# Patient Record
Sex: Female | Born: 1945 | Race: White | Hispanic: No
Health system: Southern US, Community
[De-identification: ages and names within clinical notes are randomized; demographics above are authoritative.]

## PROBLEM LIST (undated history)

## (undated) DIAGNOSIS — F319 Bipolar disorder, unspecified: Secondary | ICD-10-CM

## (undated) DIAGNOSIS — Z8489 Family history of other specified conditions: Secondary | ICD-10-CM

## (undated) DIAGNOSIS — L120 Bullous pemphigoid: Secondary | ICD-10-CM

## (undated) DIAGNOSIS — R42 Dizziness and giddiness: Secondary | ICD-10-CM

## (undated) DIAGNOSIS — G8929 Other chronic pain: Secondary | ICD-10-CM

## (undated) DIAGNOSIS — I639 Cerebral infarction, unspecified: Secondary | ICD-10-CM

## (undated) DIAGNOSIS — M109 Gout, unspecified: Secondary | ICD-10-CM

## (undated) DIAGNOSIS — I10 Essential (primary) hypertension: Secondary | ICD-10-CM

## (undated) DIAGNOSIS — M25519 Pain in unspecified shoulder: Secondary | ICD-10-CM

## (undated) DIAGNOSIS — F329 Major depressive disorder, single episode, unspecified: Secondary | ICD-10-CM

## (undated) DIAGNOSIS — K219 Gastro-esophageal reflux disease without esophagitis: Secondary | ICD-10-CM

## (undated) DIAGNOSIS — E119 Type 2 diabetes mellitus without complications: Secondary | ICD-10-CM

## (undated) DIAGNOSIS — F419 Anxiety disorder, unspecified: Secondary | ICD-10-CM

## (undated) DIAGNOSIS — N390 Urinary tract infection, site not specified: Secondary | ICD-10-CM

## (undated) DIAGNOSIS — F32A Depression, unspecified: Secondary | ICD-10-CM

## (undated) DIAGNOSIS — J189 Pneumonia, unspecified organism: Secondary | ICD-10-CM

## (undated) DIAGNOSIS — G4730 Sleep apnea, unspecified: Secondary | ICD-10-CM

## (undated) DIAGNOSIS — E7800 Pure hypercholesterolemia, unspecified: Secondary | ICD-10-CM

## (undated) DIAGNOSIS — Z9889 Other specified postprocedural states: Secondary | ICD-10-CM

## (undated) DIAGNOSIS — R112 Nausea with vomiting, unspecified: Secondary | ICD-10-CM

## (undated) HISTORY — PX: POSTERIOR FUSION CERVICAL SPINE: SUR628

## (undated) HISTORY — PX: CATARACT EXTRACTION W/ INTRAOCULAR LENS  IMPLANT, BILATERAL: SHX1307

## (undated) HISTORY — PX: WRIST SURGERY: SHX841

---

## 1971-01-22 HISTORY — PX: TUBAL LIGATION: SHX77

## 1982-01-21 HISTORY — PX: APPENDECTOMY: SHX54

## 1982-01-21 HISTORY — PX: CHOLECYSTECTOMY: SHX55

## 1987-01-22 HISTORY — PX: ANTERIOR CERVICAL DECOMP/DISCECTOMY FUSION: SHX1161

## 1988-09-21 DIAGNOSIS — J189 Pneumonia, unspecified organism: Secondary | ICD-10-CM

## 1988-09-21 HISTORY — DX: Pneumonia, unspecified organism: J18.9

## 1997-05-31 ENCOUNTER — Inpatient Hospital Stay (HOSPITAL_COMMUNITY): Admission: RE | Admit: 1997-05-31 | Discharge: 1997-06-02 | Payer: Self-pay | Admitting: Neurosurgery

## 1997-06-29 ENCOUNTER — Encounter: Admission: RE | Admit: 1997-06-29 | Discharge: 1997-09-27 | Payer: Self-pay | Admitting: Neurosurgery

## 1997-09-08 ENCOUNTER — Encounter: Admission: RE | Admit: 1997-09-08 | Discharge: 1997-12-07 | Payer: Self-pay | Admitting: Neurosurgery

## 2001-06-01 ENCOUNTER — Ambulatory Visit (HOSPITAL_COMMUNITY): Admission: RE | Admit: 2001-06-01 | Discharge: 2001-06-01 | Payer: Self-pay | Admitting: Pain Medicine

## 2001-06-07 ENCOUNTER — Ambulatory Visit (HOSPITAL_COMMUNITY): Admission: RE | Admit: 2001-06-07 | Discharge: 2001-06-07 | Payer: Self-pay | Admitting: Pain Medicine

## 2001-06-07 ENCOUNTER — Encounter: Payer: Self-pay | Admitting: Pain Medicine

## 2008-08-31 ENCOUNTER — Inpatient Hospital Stay (HOSPITAL_COMMUNITY): Admission: EM | Admit: 2008-08-31 | Discharge: 2008-09-05 | Payer: Self-pay | Admitting: Emergency Medicine

## 2008-09-01 ENCOUNTER — Encounter (INDEPENDENT_AMBULATORY_CARE_PROVIDER_SITE_OTHER): Payer: Self-pay | Admitting: Internal Medicine

## 2009-09-14 ENCOUNTER — Ambulatory Visit (HOSPITAL_COMMUNITY): Admission: RE | Admit: 2009-09-14 | Discharge: 2009-09-14 | Payer: Self-pay | Admitting: Ophthalmology

## 2009-10-05 ENCOUNTER — Ambulatory Visit (HOSPITAL_COMMUNITY): Admission: RE | Admit: 2009-10-05 | Discharge: 2009-10-05 | Payer: Self-pay | Admitting: Ophthalmology

## 2009-10-10 ENCOUNTER — Ambulatory Visit: Admission: RE | Admit: 2009-10-10 | Discharge: 2009-10-10 | Payer: Self-pay | Admitting: Neurology

## 2010-04-05 LAB — GLUCOSE, CAPILLARY: Glucose-Capillary: 146 mg/dL — ABNORMAL HIGH (ref 70–99)

## 2010-04-06 LAB — HEMOGLOBIN AND HEMATOCRIT, BLOOD: Hemoglobin: 11.6 g/dL — ABNORMAL LOW (ref 12.0–15.0)

## 2010-04-06 LAB — BASIC METABOLIC PANEL
BUN: 10 mg/dL (ref 6–23)
CO2: 27 mEq/L (ref 19–32)
Chloride: 102 mEq/L (ref 96–112)
Glucose, Bld: 126 mg/dL — ABNORMAL HIGH (ref 70–99)
Potassium: 4.4 mEq/L (ref 3.5–5.1)

## 2010-04-28 LAB — GLUCOSE, CAPILLARY
Glucose-Capillary: 230 mg/dL — ABNORMAL HIGH (ref 70–99)
Glucose-Capillary: 98 mg/dL (ref 70–99)

## 2010-04-29 LAB — HEPATIC FUNCTION PANEL
AST: 44 U/L — ABNORMAL HIGH (ref 0–37)
Albumin: 2.9 g/dL — ABNORMAL LOW (ref 3.5–5.2)
Alkaline Phosphatase: 54 U/L (ref 39–117)
Total Bilirubin: 0.6 mg/dL (ref 0.3–1.2)
Total Protein: 6.1 g/dL (ref 6.0–8.3)

## 2010-04-29 LAB — GLUCOSE, CAPILLARY
Glucose-Capillary: 136 mg/dL — ABNORMAL HIGH (ref 70–99)
Glucose-Capillary: 155 mg/dL — ABNORMAL HIGH (ref 70–99)
Glucose-Capillary: 171 mg/dL — ABNORMAL HIGH (ref 70–99)
Glucose-Capillary: 192 mg/dL — ABNORMAL HIGH (ref 70–99)
Glucose-Capillary: 204 mg/dL — ABNORMAL HIGH (ref 70–99)
Glucose-Capillary: 215 mg/dL — ABNORMAL HIGH (ref 70–99)
Glucose-Capillary: 300 mg/dL — ABNORMAL HIGH (ref 70–99)
Glucose-Capillary: 307 mg/dL — ABNORMAL HIGH (ref 70–99)
Glucose-Capillary: 381 mg/dL — ABNORMAL HIGH (ref 70–99)
Glucose-Capillary: 390 mg/dL — ABNORMAL HIGH (ref 70–99)
Glucose-Capillary: 83 mg/dL (ref 70–99)

## 2010-04-29 LAB — PROTIME-INR: INR: 0.9 (ref 0.00–1.49)

## 2010-04-29 LAB — LIPID PANEL
Cholesterol: 206 mg/dL — ABNORMAL HIGH (ref 0–200)
HDL: 40 mg/dL (ref >39)
LDL Cholesterol: 119 mg/dL — ABNORMAL HIGH (ref 0–99)
Triglycerides: 233 mg/dL — ABNORMAL HIGH (ref <150)

## 2010-04-29 LAB — URINALYSIS, ROUTINE W REFLEX MICROSCOPIC
Bilirubin Urine: NEGATIVE
Hgb urine dipstick: NEGATIVE
Protein, ur: NEGATIVE mg/dL
Urobilinogen, UA: 0.2 mg/dL (ref 0.0–1.0)

## 2010-04-29 LAB — URINE CULTURE: Colony Count: >=100000

## 2010-04-29 LAB — CARDIAC PANEL(CRET KIN+CKTOT+MB+TROPI)
CK, MB: 0.9 ng/mL (ref 0.3–4.0)
CK, MB: 0.9 ng/mL (ref 0.3–4.0)
Relative Index: INVALID (ref 0.0–2.5)
Relative Index: INVALID (ref 0.0–2.5)
Total CK: 43 U/L (ref 7–177)
Total CK: 43 U/L (ref 7–177)
Troponin I: 0.01 ng/mL (ref 0.00–0.06)

## 2010-04-29 LAB — COMPREHENSIVE METABOLIC PANEL
AST: 41 U/L — ABNORMAL HIGH (ref 0–37)
Albumin: 3 g/dL — ABNORMAL LOW (ref 3.5–5.2)
Albumin: 3.3 g/dL — ABNORMAL LOW (ref 3.5–5.2)
Alkaline Phosphatase: 57 U/L (ref 39–117)
BUN: 21 mg/dL (ref 6–23)
Chloride: 99 mEq/L (ref 96–112)
Chloride: 99 mEq/L (ref 96–112)
Creatinine, Ser: 1.01 mg/dL (ref 0.4–1.2)
Creatinine, Ser: 1.04 mg/dL (ref 0.4–1.2)
GFR calc Af Amer: >60 mL/min (ref >60)
Glucose, Bld: 227 mg/dL — ABNORMAL HIGH (ref 70–99)
Potassium: 3.3 mEq/L — ABNORMAL LOW (ref 3.5–5.1)
Potassium: 4 mEq/L (ref 3.5–5.1)
Total Bilirubin: 0.5 mg/dL (ref 0.3–1.2)
Total Bilirubin: 0.5 mg/dL (ref 0.3–1.2)

## 2010-04-29 LAB — BASIC METABOLIC PANEL
BUN: 18 mg/dL (ref 6–23)
CO2: 27 mEq/L (ref 19–32)
Calcium: 8.6 mg/dL (ref 8.4–10.5)
Calcium: 8.6 mg/dL (ref 8.4–10.5)
Calcium: 9 mg/dL (ref 8.4–10.5)
Chloride: 99 mEq/L (ref 96–112)
Creatinine, Ser: 0.84 mg/dL (ref 0.4–1.2)
Creatinine, Ser: 0.92 mg/dL (ref 0.4–1.2)
Creatinine, Ser: 1.33 mg/dL — ABNORMAL HIGH (ref 0.4–1.2)
GFR calc Af Amer: 49 mL/min — ABNORMAL LOW (ref >60)
GFR calc non Af Amer: >60 mL/min (ref >60)
Glucose, Bld: 158 mg/dL — ABNORMAL HIGH (ref 70–99)
Glucose, Bld: 194 mg/dL — ABNORMAL HIGH (ref 70–99)

## 2010-04-29 LAB — CBC
MCV: 88 fL (ref 78.0–100.0)
Platelets: 206 10*3/uL (ref 150–400)
RBC: 4.23 MIL/uL (ref 3.87–5.11)
WBC: 7.8 10*3/uL (ref 4.0–10.5)
WBC: 8 10*3/uL (ref 4.0–10.5)

## 2010-04-29 LAB — DIFFERENTIAL
Lymphs Abs: 3 10*3/uL (ref 0.7–4.0)
Monocytes Relative: 8 % (ref 3–12)
Neutro Abs: 3.9 10*3/uL (ref 1.7–7.7)
Neutrophils Relative %: 50 % (ref 43–77)

## 2010-04-29 LAB — URINE MICROSCOPIC-ADD ON

## 2010-04-29 LAB — APTT: aPTT: 25 seconds (ref 24–37)

## 2010-04-29 LAB — TSH: TSH: 2.584 u[IU]/mL (ref 0.350–4.500)

## 2010-04-29 LAB — HEMOGLOBIN A1C: Mean Plasma Glucose: 272 mg/dL

## 2010-06-05 NOTE — H&P (Signed)
NAME:  Sylvia Black, Sylvia Black                ACCOUNT NO.:  [id number]   MEDICAL RECORD NO.:  [id number]          PATIENT TYPE:  OBV   LOCATION:  4735                         FACILITY:  [facility]   PHYSICIAN:  Renee Ramus, MD       DATE OF BIRTH:  08/14/1945   DATE OF ADMISSION:  08/30/2008  DATE OF DISCHARGE:                              HISTORY & PHYSICAL   The patient does not have a primary care physician in this area.  Please  list the patient as unassigned.   HISTORY OF PRESENT ILLNESS:  The patient is a 65 year old female with  complaints of weakness x24 hours prior to admission.  The patient  reports no previous history of these symptoms.  She reports listing to  one side.  She denies any focal neurological deficits.  She does say  that the sensation is worse with ambulation, decreases with rest.  The  patient says that it effects her gait, and has caused her to almost  bump into furniture.  The patient denies any previous ear infections or  any recent changes to medications.   PAST MEDICAL HISTORY:  1. Bipolar disease.  2. Hypertension.  3. Diabetes mellitus type 2, uncontrolled.  4. Morbid obesity.  5. Obstructive sleep apnea.   SOCIAL HISTORY:  The patient denies alcohol or tobacco use.   FAMILY HISTORY:  Not available.   REVIEW OF SYSTEMS:  All other comprehensive review of systems are  negative.   ALLERGIES:  The patient is allergic to SULFA.   CURRENT MEDICATIONS:  1. Lasix 40 mg p.o. daily.  2. Hydroxyzine 25 mg p.o. daily as needed.  3. Accuretic, which is quinapril/hydrochlorothiazide 20/25 one p.o.      daily.  4. Toprol-XL 50 mg p.o. daily.  5. Tizanidine 4 mg p.o. daily.  6. Avandia 4 mg p.o. daily.  7. Klor-Con 10 mEq p.o. daily.  8. Clonazepam 1 mg p.o. t.i.d.  9. Zolpidem tartrate 10 mg p.o. at bedtime p.r.n. insomnia.   PHYSICAL EXAMINATION:  GENERAL:  This is a morbidly obese white female  currently in no apparent distress.  VITAL SIGNS:  Blood pressure  143/49, heart rate 72, respiratory rate 22,  temperature 97.4.  NECK:  No jugular venous distention or  lymphadenopathy.  HEENT:  Oropharynx is clear.  Mucous membranes pink and moist.  TMs  clear bilaterally.  Pupils equal, reactive to light and accommodation.  Extraocular muscles are intact.  CARDIOVASCULAR:  Regular rate and  rhythm without murmurs, rubs, or gallops.  PULMONARY:  Lungs are clear  to auscultation bilaterally.  ABDOMEN:  Soft, obese, nontender, nondistended without  hepatosplenomegaly.  Bowel sounds are present.  She has no rebound or  guarding.  EXTREMITIES:  She has no clubbing, cyanosis, or edema.  She has good  peripheral pulses in dorsalis pedis and radial arteries.  She is able to  move all extremities.  NEUROLOGIC:  Cranial nerves II through XII are grossly intact.  She has  no focal neurological deficits with the exception of nystagmus prominent  to the right.   STUDIES:  Head CT  shows small vessel disease, but no evidence of stroke.   LABORATORY DATA:  White count 7.8, H&H 12.7 and 37, MCV 80, platelets  232.  Sodium 132, potassium 3.9, chloride 93, bicarb 29, BUN 20,  creatinine 1.3, glucose 247.  UA shows signs of infection.   ASSESSMENT AND PLAN:  1. Vertigo.  Likely this is benign paroxysmal positional vertigo.  We      will attempt to treat with meclizine and reassess in the a.m.  We      will also get physical therapy evaluation for possible vestibular      training. The possiblity exists that this is central vertigo, so      will also work up for potential stroke.  2. Diabetes mellitus type 2, uncontrolled.  We will check hemoglobin      A1c and continue Avandia.  We will also give a small amount of      Lantus and use sliding scale insulin.  3. Hypertension.  We will start the patient on captopril and hold her      diuretic.  4. Obesity.  We will check thyroid.  5. Urinary tract infection.  We will start the patient on      ciprofloxacin.  6.  Bipolar disease.  We will continue benzyl therapy.  7. Dehydration.  We will discontinue hydrochlorothiazide and give the      patient conservative IV fluids.  8. Obstructive sleep apnea.  We will continue continuous positive      airway pressure with settings 12/10, and RT will be able to adjust      this for comfort.   H&P was constructed by reviewing past medical history, conferring with  emergency medical room physician, and reviewing the emergency medical  record.   TIME SPENT:  One hour.      Renee Ramus, MD  Electronically Signed     JF/MEDQ  D:  08/30/2008  T:  08/31/2008  Job:  [id number]

## 2010-06-05 NOTE — Group Therapy Note (Signed)
NAMEMarland Kitchen  Sylvia Black, Sylvia Black                ACCOUNT NO.:  [id number]   MEDICAL RECORD NO.:  [id number]          PATIENT TYPE:  INP   LOCATION:  4735                         FACILITY:  [facility]   PHYSICIAN:  Herbie Saxon, MDDATE OF BIRTH:  [birth year]                                 PROGRESS NOTE   DISCHARGE DIAGNOSES:  1. Acute right cerebrovascular accident.  2. Left middle cerebral artery stenosis.  3. Hyponatremia, improved.  4. Urinary tract infection.  5. Morbid obesity.  6. Obstructive sleep apnea, on CPAP.  7. Hypokalemia, repleted.  8. Elevated AST.  9. Diabetes, type 2.  10.Hyperlipidemia.  11.Acute renal insufficiency, resolved.  12.Hypertension, stable.  13.Nonsustained V-tach.  14.History of bipolar disease.   RADIOLOGY:  1. The CT head of August 30, 2008, shows no evidence of acute      intracranial abnormality.  2. 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  3. MRI brain of August 31, 2008, revealed acute to subacute ischemia      involving the right corona radiata adjacent to the body of the      right ventricle, remote lacunar infarct, nonspecific subcortical      white matter changes supratentorially, mild inflammatory mucosal      thickening of the maxillary sinuses and the ethmoid air cells,      probable remote ischemia related to blood breakdown products      bifrontally.  4. MRA head and neck of August 31, 2008, shows probable significant      stenosis of the left middle cerebral artery in the distal M1      segment, probable moderate atherosclerotic changes involving the      left posterior cerebral artery.   HOSPITAL COURSE:  This 65 year old Caucasian lady who does not have a primary care  physician and who has financial  difficulties with the medications  presented to the emergency room with difficulty walking with severe  unsteadiness.  At presentation, diabetes mellitus was noted to be  uncontrolled.  Blood pressure was also noticed to be marginally  controlled.  Urine test did show signs of urinary tract infection.  Urine culture was sent and patient was started on ciprofloxacin.  Her  hydrochlorothiazide was discontinued and patient was started on  __________ IV fluids.  CPAP was continued at the home setting of 1210  with respiratory therapy adjusted.  With the IV fluid hydration,  hyponatremia had resolved as of August 31, 2008.  Glucose control was  also improved.  Hypokalemia was repleted.  Lipid panel showed a total  cholesterol of 203, triglycerides 233, LDL fraction of 119.  Patient was  started on Zocor.  Lantus insulin dosage has been escalated on a daily  basis to optimize her glycemic control.  Patient had been started on  captopril.  Blood pressures remain stable.  Urine culture as of September 01, 2008, showed more than 100,000 colonies, identity not yet specified,  microbiology  not yet specified.  Patient has been ambulating with PT  support and she is being set up by social workers for home health PT/OT  followup.  Patient will also need social worker assistance with  obtaining the medications.  Echocardiogram on this admission performed  on September 01, 2008, showed that ejection fraction was 60% to 65%.  No  cardiac source of emboli seen.  She has mild left ventricular atrophy.   DISPOSITION:  Will be home with home health and also vestibular physical therapy as  outpatient as needed.   MEDICATIONS:  Will be dictated at final discharge.   EXAMINATION:  Today, she is an elderly lady not in acute distress.  VITAL SIGNS:  Temperature 97.  Pulse 63.  Respiratory rate is 20.  Blood  pressure 133/64.  HEAD:  Atraumatic, normocephalic.  She is morbidly obese.  Pupils equal  and reactive to light and accommodation.  Mucous membranes are moist.  Oropharynx and nasopharynx are clear.  Tympanic  membranes are intact.  NECK:  Supple.  No elevated JVD.  No thyromegaly.  No carotid bruits.  No lymphadenopathy.  CHEST:  Clinically clear.  HEART:  Sounds 1 and 2 regular rate and rhythm.  No murmur, rubs,  gallops, or clicks.  ABDOMEN:  Benign.  She has truncal obesity.  No organomegaly.  No  ventral or inguinal hernia.  She has mild left hemiparesis in the left  upper extremities, 4+, and left lower extremity 4.  Has a hemiplegic  gait.  Peripheral pulses present.  No pedal edema.   CURRENT LABS:  Sodium is 135, potassium 4.0, chloride 99, bicarbonate 28, glucose 194,  BUN 18, creatinine 0.9.  hemoglobin A1c 11.1.  Thyroid function test  normal.  WBC 8, hematocrit 36, platelet count 206.      Herbie Saxon, MD  Electronically Signed     MIO/MEDQ  D:  09/04/2008  T:  09/04/2008  Job:  [id number]

## 2010-06-05 NOTE — Discharge Summary (Signed)
NAMEMarland Black  [name], [name]                ACCOUNT NO.:  [id number]   MEDICAL RECORD NO.:  [id number]          PATIENT TYPE:  INP   LOCATION:  4735                         FACILITY:  [facility]   PHYSICIAN:  Elliot Cousin, M.D.    DATE OF BIRTH:  22-Apr-1945   DATE OF ADMISSION:  08/30/2008  DATE OF DISCHARGE:  09/05/2008                               DISCHARGE SUMMARY   Please see the previous progress note summary dictated by Dr. Christella Noa  on September 04, 2008.   DISCHARGE DIAGNOSES AND HOSPITAL COURSE:  1. ACUTE RIGHT BRAIN STROKE, REMOTE LACUNAR STROKES, AND PROBABLE      SIGNIFICANT STENOSIS OF THE LEFT MIDDLE CEREBRAL ARTERY IN THE      DISTAL M1 SEGMENT.  The patient was started on aspirin therapy.      The results of the MRI of the brain and MRA of the brain were noted      for a probable significant stenosis of the left middle cerebral      artery, moderate atherosclerotic disease, acute and subacute      ischemia involving the right corona radiata and remote lacunar      infarcts.  The results of her 2-D echocardiogram revealed an      ejection fraction of 60% to 65% with no regional wall motion      abnormalities and no obvious cardiac source of emboli identified.      The patient does have mild left-sided hemiparesis and      disequilibrium as a consequence of the acute stroke.  She was      evaluated by both the physical and occupational therapists.  They      both recommended vestibular training and ongoing therapy at home.      They also recommended a rolling walker.  These services were      ordered prior to the patient's discharge.  In addition to aspirin      therapy, she was started on scheduled meclizine.  However, upon      discharge, the meclizine was changed to as needed for treatment of      dizziness or vertiginous symptoms.  2. DYSLIPIDEMIA.  The patient's fasting lipid profile revealed a total      cholesterol of 206, triglycerides of 233, HDL cholesterol of 40,      and  LDL cholesterol of 119.  She was started on Zocor 40 mg      nightly.  Because of financial constraints, the Zocor was      discontinued upon discharge and she was prescribed pravastatin,      which she will be able to purchase at the [facility] pharmacy for $4      per prescription.  3. TYPE 2 INSULIN-REQUIRING DIABETES MELLITUS.  The patient was      started on sliding scale NovoLog and Lantus.  However at home, she      was treated with sliding scale regular insulin and NPH.  Her      glycemic control was somewhat labile.  Her hemoglobin A1c was quite  elevated at 11.1.  Because of financial constraints, the patient      was discharged to home on her usual dose of NPH insulin and sliding      scale regular insulin.  Further management will be deferred to her      primary care physician at the [facility] Department.  4. HYPERTENSION.  The patient's blood pressure was more or less      controlled during hospitalization.  She was maintained on her usual      antihypertensive medications.  5. URINARY TRACT INFECTION.  The patient's urinalysis was positive for      WBCs.  She was started on Cipro and Diflucan empirically.  Her      urine culture grew out greater than 100,000 colonies of multiple      bacteria, none predominant.  She completed 7 days of Cipro therapy      and several days of Diflucan therapy.  Prior to hospital discharge,      she had no complaints of painful urination.  She was afebrile.  Her      white blood cell count was within normal limits.  6. HYPOKALEMIA.  The patient's serum potassium fell to a nadir of 3.3.      She was repleted with potassium chloride.  Prior to hospital      discharge, her serum potassium improved to 4.0.  7. MORBID OBESITY AND OBSTRUCTIVE SLEEP APNEA.  The patient was      continued on CPAP therapy.  Weight loss and adherence to a diabetic      diet were encouraged during the hospitalization.  8. ACUTE RENAL INSUFFICIENCY.  The  patient's BUN and creatinine      normalized to 18 and 0.92 respectively.  9. NONSUSTAINED VENTRICULAR TACHYCARDIA.  The patient experienced a      brief run of V-TACH.  Cardiac enzymes were ordered and they were      completely negative.   DISCHARGE MEDICATIONS:  1. Lasix 40 mg daily.  2. Hydroxyzine 25 mg daily as needed.  3. Accuretic (quinapril and hydrochlorothiazide) 20 mg/25 mg daily.  4. Toprol-XL 50 mg daily.  5. Tizanidine 4 mg daily.  6. Stop Avandia.  7. Klor-Con 10 mEq daily.  8. Clonazepam 1 mg t.i.d.  9. Zolpidem 10 mg at bedtime.  10.Wellbutrin XL 150 mg twice daily.  11.Humulin N 55 units subcu in the morning and subcu in the evening.  12.Sliding scale regular insulin before each meal as prescribed      previously.  13.Pravastatin 20 mg nightly.  14.Meclizine 25 mg every 8 hours as needed for dizziness.  15.Aspirin 325 mg daily.  16.CPAP nightly.   DISCHARGE DISPOSITION:  The patient is being discharged to home in  improved and stable condition.  Because of financial constraints, most  if not all of her previous home medications were restarted prior to  hospital discharge.  The patient is able to get her medications from the  Health Department in [facility].  Otherwise, the patient was  advised to purchase pravastatin and meclizine at [facility] pharmacy for  $4 per prescription.  Home Health Services have been arranged including  a Child psychotherapist, physical therapist, occupational therapist, and  appropriate equipment.  She was advised to follow up with her primary  care physician at the [facility] Department in 1 week.  The patient voiced understanding.      Elliot Cousin, M.D.  Electronically Signed  DF/MEDQ  D:  09/05/2008  T:  09/06/2008  Job:  [id number]   cc:   [facility]

## 2013-02-21 ENCOUNTER — Inpatient Hospital Stay (HOSPITAL_COMMUNITY)
Admission: EM | Admit: 2013-02-21 | Discharge: 2013-02-23 | DRG: 638 | Disposition: A | Discharge status: Home / Self Care | Payer: Medicare HMO | Attending: Family Medicine | Admitting: Family Medicine

## 2013-02-21 ENCOUNTER — Encounter (HOSPITAL_COMMUNITY): Payer: Self-pay | Admitting: Emergency Medicine

## 2013-02-21 DIAGNOSIS — Z887 Allergy status to serum and vaccine status: Secondary | ICD-10-CM

## 2013-02-21 DIAGNOSIS — E785 Hyperlipidemia, unspecified: Secondary | ICD-10-CM | POA: Diagnosis present

## 2013-02-21 DIAGNOSIS — Z7902 Long term (current) use of antithrombotics/antiplatelets: Secondary | ICD-10-CM

## 2013-02-21 DIAGNOSIS — Z7982 Long term (current) use of aspirin: Secondary | ICD-10-CM

## 2013-02-21 DIAGNOSIS — Z880 Allergy status to penicillin: Secondary | ICD-10-CM

## 2013-02-21 DIAGNOSIS — I639 Cerebral infarction, unspecified: Secondary | ICD-10-CM

## 2013-02-21 DIAGNOSIS — T383X5A Adverse effect of insulin and oral hypoglycemic [antidiabetic] drugs, initial encounter: Secondary | ICD-10-CM | POA: Diagnosis present

## 2013-02-21 DIAGNOSIS — E119 Type 2 diabetes mellitus without complications: Secondary | ICD-10-CM

## 2013-02-21 DIAGNOSIS — Z882 Allergy status to sulfonamides status: Secondary | ICD-10-CM

## 2013-02-21 DIAGNOSIS — Z9851 Tubal ligation status: Secondary | ICD-10-CM

## 2013-02-21 DIAGNOSIS — N390 Urinary tract infection, site not specified: Secondary | ICD-10-CM | POA: Diagnosis present

## 2013-02-21 DIAGNOSIS — Y92009 Unspecified place in unspecified non-institutional (private) residence as the place of occurrence of the external cause: Secondary | ICD-10-CM

## 2013-02-21 DIAGNOSIS — Z8673 Personal history of transient ischemic attack (TIA), and cerebral infarction without residual deficits: Secondary | ICD-10-CM

## 2013-02-21 DIAGNOSIS — F411 Generalized anxiety disorder: Secondary | ICD-10-CM | POA: Diagnosis present

## 2013-02-21 DIAGNOSIS — R680 Hypothermia, not associated with low environmental temperature: Secondary | ICD-10-CM | POA: Diagnosis present

## 2013-02-21 DIAGNOSIS — I10 Essential (primary) hypertension: Secondary | ICD-10-CM

## 2013-02-21 DIAGNOSIS — E1169 Type 2 diabetes mellitus with other specified complication: Principal | ICD-10-CM | POA: Diagnosis present

## 2013-02-21 DIAGNOSIS — E162 Hypoglycemia, unspecified: Secondary | ICD-10-CM | POA: Diagnosis present

## 2013-02-21 DIAGNOSIS — T68XXXA Hypothermia, initial encounter: Secondary | ICD-10-CM

## 2013-02-21 DIAGNOSIS — Z79899 Other long term (current) drug therapy: Secondary | ICD-10-CM

## 2013-02-21 DIAGNOSIS — Z794 Long term (current) use of insulin: Secondary | ICD-10-CM

## 2013-02-21 HISTORY — DX: Gastro-esophageal reflux disease without esophagitis: K21.9

## 2013-02-21 HISTORY — DX: Nausea with vomiting, unspecified: R11.2

## 2013-02-21 HISTORY — DX: Cerebral infarction, unspecified: I63.9

## 2013-02-21 HISTORY — DX: Major depressive disorder, single episode, unspecified: F32.9

## 2013-02-21 HISTORY — DX: Pain in unspecified shoulder: M25.519

## 2013-02-21 HISTORY — DX: Other chronic pain: G89.29

## 2013-02-21 HISTORY — DX: Type 2 diabetes mellitus without complications: E11.9

## 2013-02-21 HISTORY — DX: Nausea with vomiting, unspecified: Z98.890

## 2013-02-21 HISTORY — DX: Anxiety disorder, unspecified: F41.9

## 2013-02-21 HISTORY — DX: Bipolar disorder, unspecified: F31.9

## 2013-02-21 HISTORY — DX: Family history of other specified conditions: Z84.89

## 2013-02-21 HISTORY — DX: Sleep apnea, unspecified: G47.30

## 2013-02-21 HISTORY — DX: Urinary tract infection, site not specified: N39.0

## 2013-02-21 HISTORY — DX: Depression, unspecified: F32.A

## 2013-02-21 HISTORY — DX: Gout, unspecified: M10.9

## 2013-02-21 HISTORY — DX: Pure hypercholesterolemia, unspecified: E78.00

## 2013-02-21 HISTORY — DX: Essential (primary) hypertension: I10

## 2013-02-21 HISTORY — DX: Dizziness and giddiness: R42

## 2013-02-21 HISTORY — DX: Pneumonia, unspecified organism: J18.9

## 2013-02-21 LAB — URINE MICROSCOPIC-ADD ON

## 2013-02-21 LAB — GLUCOSE, CAPILLARY
GLUCOSE-CAPILLARY: 77 mg/dL (ref 70–99)
Glucose-Capillary: 158 mg/dL — ABNORMAL HIGH (ref 70–99)
Glucose-Capillary: 39 mg/dL — CL (ref 70–99)
Glucose-Capillary: 136 mg/dL — ABNORMAL HIGH (ref 70–99)
Glucose-Capillary: 139 mg/dL — ABNORMAL HIGH (ref 70–99)

## 2013-02-21 LAB — URINALYSIS, ROUTINE W REFLEX MICROSCOPIC
BILIRUBIN URINE: NEGATIVE
GLUCOSE, UA: 250 mg/dL — AB
HGB URINE DIPSTICK: NEGATIVE
Ketones, ur: NEGATIVE mg/dL
Nitrite: NEGATIVE
Protein, ur: NEGATIVE mg/dL
SPECIFIC GRAVITY, URINE: 1.013 (ref 1.005–1.030)
Urobilinogen, UA: 1 mg/dL (ref 0.0–1.0)
pH: 6 (ref 5.0–8.0)

## 2013-02-21 LAB — COMPREHENSIVE METABOLIC PANEL
ALK PHOS: 71 U/L (ref 39–117)
ALT: 25 U/L (ref 0–35)
AST: 29 U/L (ref 0–37)
Albumin: 3 g/dL — ABNORMAL LOW (ref 3.5–5.2)
BUN: 17 mg/dL (ref 6–23)
CO2: 22 mEq/L (ref 19–32)
Calcium: 8.8 mg/dL (ref 8.4–10.5)
Chloride: 99 mEq/L (ref 96–112)
Creatinine, Ser: 0.7 mg/dL (ref 0.50–1.10)
GFR calc non Af Amer: 89 mL/min — ABNORMAL LOW (ref >90)
GLUCOSE: 135 mg/dL — AB (ref 70–99)
Potassium: 3.8 mEq/L (ref 3.7–5.3)
Sodium: 139 mEq/L (ref 137–147)
Total Bilirubin: <0.2 mg/dL — ABNORMAL LOW (ref 0.3–1.2)
Total Protein: 6.7 g/dL (ref 6.0–8.3)

## 2013-02-21 LAB — CBC WITH DIFFERENTIAL/PLATELET
BASOS PCT: 0 % (ref 0–1)
Basophils Absolute: 0 10*3/uL (ref 0.0–0.1)
EOS ABS: 0.1 10*3/uL (ref 0.0–0.7)
Eosinophils Relative: 1 % (ref 0–5)
HEMATOCRIT: 35.6 % — AB (ref 36.0–46.0)
Hemoglobin: 12.1 g/dL (ref 12.0–15.0)
LYMPHS ABS: 2.1 10*3/uL (ref 0.7–4.0)
Lymphocytes Relative: 21 % (ref 12–46)
MCH: 30.2 pg (ref 26.0–34.0)
MCHC: 34 g/dL (ref 30.0–36.0)
MCV: 88.8 fL (ref 78.0–100.0)
MONO ABS: 0.7 10*3/uL (ref 0.1–1.0)
Monocytes Relative: 7 % (ref 3–12)
NEUTROS ABS: 7.2 10*3/uL (ref 1.7–7.7)
NEUTROS PCT: 71 % (ref 43–77)
Platelets: 254 10*3/uL (ref 150–400)
RBC: 4.01 MIL/uL (ref 3.87–5.11)
RDW: 13.6 % (ref 11.5–15.5)
WBC: 10.2 10*3/uL (ref 4.0–10.5)

## 2013-02-21 MED ORDER — DEXTROSE-NACL 5-0.45 % IV SOLN
INTRAVENOUS | Status: DC
  Administered 2013-02-21 – 2013-02-23 (×3): via INTRAVENOUS

## 2013-02-21 MED ORDER — SODIUM CHLORIDE 0.9 % IV SOLN
INTRAVENOUS | Status: DC
  Administered 2013-02-21: 21:00:00 via INTRAVENOUS

## 2013-02-21 MED ORDER — SODIUM CHLORIDE 0.9 % IV BOLUS (SEPSIS)
1000.0000 mL | Freq: Once | INTRAVENOUS | Status: AC
  Administered 2013-02-21: 1000 mL via INTRAVENOUS

## 2013-02-21 MED ORDER — DEXTROSE 50 % IV SOLN
INTRAVENOUS | Status: AC
  Filled 2013-02-21: qty 50

## 2013-02-21 MED ORDER — DEXTROSE 50 % IV SOLN
1.0000 | Freq: Once | INTRAVENOUS | Status: AC
  Administered 2013-02-21: 50 mL via INTRAVENOUS

## 2013-02-21 MED ORDER — DEXTROSE 5 % IV SOLN: Freq: Once | INTRAVENOUS | Status: DC

## 2013-02-21 MED ORDER — ONDANSETRON HCL 4 MG/2ML IJ SOLN
4.0000 mg | Freq: Once | INTRAMUSCULAR | Status: AC
  Administered 2013-02-21: 4 mg via INTRAVENOUS
  Filled 2013-02-21: qty 2

## 2013-02-21 NOTE — ED Provider Notes (Signed)
CSN: [id number]     Arrival date & time 02/21/13  2027 History   First MD Initiated Contact with Patient 02/21/13 2027     Chief Complaint  Patient presents with  . Hypoglycemia   (Consider location/radiation/quality/duration/timing/severity/associated sxs/prior Treatment) Patient is a 68 y.o. female presenting with hypoglycemia.  Hypoglycemia  Pt with history of DM reports she has had multiple episodes of vomiting over the last 24hrs, not associated with fever abdominal pain or diarrhea. Today she began to feel lightheaded, was unresponsive for a time and so family began CPR although it isn't clear if she was ever pulseless. EMS found her to have CBG of 22, given D50 and started on LR infusion with some improvement in her mental status. Still feeling nauseated. [place], does not yet have a PCP there.   Past Medical History  Diagnosis Date  . Diabetes mellitus without complication   . Hypertension   . Kidney infection   . Stroke    Past Surgical History  Procedure Laterality Date  . Tubal ligation    . Neck surgery    . Wrist surgery     No family history on file. History  Substance Use Topics  . Smoking status: Never Smoker   . Smokeless tobacco: Not on file  . Alcohol Use: No   OB History   Grav Para Term Preterm Abortions TAB SAB Ect Mult Living                 Review of Systems All other systems reviewed and are negative except as noted in HPI.   Allergies  Sulfa antibiotics and Tetanus toxoids  Home Medications  No current outpatient prescriptions on file. BP 131/65  Pulse 59  Resp 18  Ht 5' 2.5" (1.588 m)  Wt 203 lb (92.08 kg)  BMI 36.51 kg/m2  SpO2 97% Physical Exam  Nursing note and vitals reviewed. Constitutional: She is oriented to person, place, and time. She appears well-developed and well-nourished.  HENT:  Head: Normocephalic and atraumatic.  Eyes: EOM are normal. Pupils are equal, round, and reactive to  light.  Neck: Normal range of motion. Neck supple.  Cardiovascular: Normal rate, normal heart sounds and intact distal pulses.   Pulmonary/Chest: Effort normal and breath sounds normal.  Abdominal: Bowel sounds are normal. She exhibits no distension. There is no tenderness.  Musculoskeletal: Normal range of motion. She exhibits no edema and no tenderness.  Neurological: She is alert and oriented to person, place, and time. No cranial nerve deficit or sensory deficit.  R side 5/5, L side 4/5 from old stroke unchanged from baseline  Skin: Skin is warm and dry. No rash noted.  Psychiatric: She has a normal mood and affect.    ED Course  Procedures (including critical care time) Labs Review Labs Reviewed  CBC WITH DIFFERENTIAL - Abnormal; Notable for the following:    HCT 35.6 (*)    All other components within normal limits  COMPREHENSIVE METABOLIC PANEL - Abnormal; Notable for the following:    Glucose, Bld 135 (*)    Albumin 3.0 (*)    Total Bilirubin <0.2 (*)    GFR calc non Af Amer 89 (*)    All other components within normal limits  GLUCOSE, CAPILLARY - Abnormal; Notable for the following:    Glucose-Capillary 39 (*)    All other components within normal limits  GLUCOSE, CAPILLARY - Abnormal; Notable for the following:    Glucose-Capillary 158 (*)  All other components within normal limits  GLUCOSE, CAPILLARY - Abnormal; Notable for the following:    Glucose-Capillary 136 (*)    All other components within normal limits  GLUCOSE, CAPILLARY - Abnormal; Notable for the following:    Glucose-Capillary 139 (*)    All other components within normal limits  GLUCOSE, CAPILLARY  URINALYSIS, ROUTINE W REFLEX MICROSCOPIC   Imaging Review No results found.  EKG Interpretation   None       MDM   1. Hypoglycemia   2. Hypothermia     Pt dropped CBG again after arrival, did not have mental status changes. Given another D50 bolus and started on D5 drip. Bair hugger for  hypothermia, pt feeling much better. Essentially asymptomatic at this point. Will admit for further warming.     Charles B. Bernette Mayers, MD 02/21/13 [phone]

## 2013-02-21 NOTE — H&P (Signed)
[facility] History and Physical  Sylvia Black:[id number] DOB: 29-Mar-1945 DOA: 02/21/2013  Referring physician: ED physician PCP: [name], MD   Chief Complaint: Unresponsive  HPI:  Patient is 68 year old female with a history of diabetes mellitus requiring insulin, hypertension, hyperlipidemia, anxiety who was brought by EMS to [facility] emergency department after found unresponsive at home. Upon EMS arrival, CBG found to be in 30 inpatient hypothermic with temperature 92.5. Currently in emergency department patient is Sylvia and awake, feeling well and able to provide history. She explains she has taken her regular insulin dose but did not eat as she was feeling sick, nausea and malaise. Patient reports feeling weak over the past several days, poor oral intake, subjective fevers at home but she has never checked her temperature. She currently denies chest pain and shortness of breath, no specific abdominal or urinary concerns. She denies similar events in the past.  In emergency department, patient Black hemodynamically, temperature 92.6 Fahrenheit, last CBG 126. [facility] asked to admit to telemetry bed for further evaluation.  Assessment and Plan: Active Problems: Altered mental status - Most likely secondary to hypoglycemic event which was triggered by insulin in the setting of no oral intake - Patient is currently clinically Black, Sylvia and oriented x3 - Will admit to telemetry floor for further evaluation - Obtain 12-lead EKG, check urine drug screen, one set of cardiac enzyme, urinalysis, chest x-ray Diabetes mellitus - Hold antihyperglycemic regimen for now - Check A1c - Restart home insulin regimen once clinically indicated Hypertension - Blood pressure Black on admission, resume home medical regimen   Code Status: Full Family Communication: Pt at Black Disposition Plan: Admit to telemetry bed   Review of Systems:  Constitutional: Negative  for fever, chills and malaise/fatigue. Negative for diaphoresis.  HENT: Negative for hearing loss, ear pain, nosebleeds, congestion, sore throat, neck pain, tinnitus and ear discharge.   Eyes: Negative for blurred vision, double vision, photophobia, pain, discharge and redness.  Respiratory: Negative for cough, hemoptysis, sputum production, shortness of breath, wheezing and stridor.   Cardiovascular: Negative for chest pain, palpitations, orthopnea, claudication and leg swelling.  Gastrointestinal: Negative for nausea, vomiting and abdominal pain. Negative for heartburn, constipation, blood in stool and melena.  Genitourinary: Negative for dysuria, urgency, frequency, hematuria and flank pain.  Musculoskeletal: Negative for myalgias, back pain, joint pain and falls.  Skin: Negative for itching and rash.  Neurological: Negative for dizziness and weakness. Negative for tingling, tremors, sensory change, speech change, focal weakness, loss of consciousness and headaches.  Endo/Heme/Allergies: Negative for environmental allergies and polydipsia. Does not bruise/bleed easily.  Psychiatric/Behavioral: Negative for suicidal ideas. The patient is not nervous/anxious.      Past Medical History  Diagnosis Date  . Diabetes mellitus without complication   . Hypertension   . Kidney infection   . Stroke     Past Surgical History  Procedure Laterality Date  . Tubal ligation    . Neck surgery    . Wrist surgery      Social History:  reports that she has never smoked. She does not have any smokeless tobacco history on file. She reports that she does not drink alcohol. Her drug history is not on file.  Allergies  Allergen Reactions  . Penicillins Hives  . Sulfa Antibiotics     "hives"  . Tetanus Toxoids     "I had a strong reaction".    No family history on file.  Prior to Admission medications  Medication Sig Start Date End Date Taking? Authorizing Provider  amLODipine (NORVASC) 10 MG  tablet Take 10 mg by mouth daily.   Yes Historical Provider, MD  aspirin EC 81 MG tablet Take 81 mg by mouth daily.   Yes Historical Provider, MD  buPROPion (WELLBUTRIN XL) 150 MG 24 hr tablet Take 150 mg by mouth 3 (three) times daily as needed (pain).   Yes Historical Provider, MD  clonazePAM (KLONOPIN) 1 MG tablet Take 1 mg by mouth at bedtime.   Yes Historical Provider, MD  clopidogrel (PLAVIX) 75 MG tablet Take 75 mg by mouth daily with breakfast.   Yes Historical Provider, MD  Cranberry-Vitamin C-Probiotic (AZO CRANBERRY PO) Take 1 capsule by mouth daily as needed (kidney infections).   Yes Historical Provider, MD  diclofenac sodium (VOLTAREN) 1 % GEL Apply 4 g topically daily as needed (pain).   Yes Historical Provider, MD  insulin NPH-regular Human (NOVOLIN 70/30) (70-30) 100 UNIT/ML injection Inject 40-70 Units into the skin 2 (two) times daily with a meal. Take 70 units after breakfast and 40 units after dinner   Yes Historical Provider, MD  lidocaine (LIDODERM) 5 % Place 1-3 patches onto the skin daily. Remove & Discard patch within 12 hours or as directed by MD   Yes Historical Provider, MD  lisinopril (PRINIVIL,ZESTRIL) 40 MG tablet Take 40 mg by mouth daily after breakfast.   Yes Historical Provider, MD  metFORMIN (GLUCOPHAGE-XR) 500 MG 24 hr tablet Take 500 mg by mouth daily with breakfast.   Yes Historical Provider, MD  metoprolol succinate (TOPROL-XL) 100 MG 24 hr tablet Take 100 mg by mouth daily. Take with or immediately following a meal.   Yes Historical Provider, MD  Multiple Vitamin (MULTIVITAMIN WITH MINERALS) TABS tablet Take 1 tablet by mouth daily.   Yes Historical Provider, MD  pravastatin (PRAVACHOL) 20 MG tablet Take 20 mg by mouth daily.   Yes Historical Provider, MD  tiZANidine (ZANAFLEX) 4 MG tablet Take 4-8 mg by mouth 2 (two) times daily. Take 1 tablet (4mg ) in the morning and 2 tablets (8mg ) at night   Yes Historical Provider, MD    Physical Exam: Filed Vitals:    02/21/13 2045 02/21/13 2139 02/21/13 2152 02/21/13 2258  BP: 131/65   121/56  Pulse: 59  63 58  Temp:  92.7 F (33.7 C)  94.3 F (34.6 C)  TempSrc:  Rectal  Oral  Resp: 18  18 15   Height: 5' 2.5" (1.588 m)     Weight: 92.08 kg (203 lb)     SpO2: 97%  100% 97%    Physical Exam  Constitutional: Appears well-developed and well-nourished. No distress.  HENT: Normocephalic. External right and left ear normal. Oropharynx is clear and moist.  Eyes: Conjunctivae and EOM are normal. PERRLA, no scleral icterus.  Neck: Normal ROM. Neck supple. No JVD. No tracheal deviation. No thyromegaly.  CVS: RRR, S1/S2 +, no murmurs, no gallops, no carotid bruit.  Pulmonary: Effort and breath sounds normal, no stridor, rhonchi, wheezes, rales.  Abdominal: Soft. BS +,  no distension, tenderness, rebound or guarding.  Musculoskeletal: Normal range of motion. No edema and no tenderness.  Lymphadenopathy: No lymphadenopathy noted, cervical, inguinal. Neuro: Sylvia. Normal reflexes, muscle tone coordination. No cranial nerve deficit. Skin: Skin is warm and dry. No rash noted. Not diaphoretic. No erythema. No pallor.  Psychiatric: Normal mood and affect. Behavior, judgment, thought content normal.   Labs on Admission:  Basic Metabolic Panel:  Recent Labs Lab 02/21/13  2221  NA 139  K 3.8  CL 99  CO2 22  GLUCOSE 135*  BUN 17  CREATININE 0.70  CALCIUM 8.8   Liver Function Tests:  Recent Labs Lab 02/21/13 2221  AST 29  ALT 25  ALKPHOS 71  BILITOT <0.2*  PROT 6.7  ALBUMIN 3.0*   CBC:  Recent Labs Lab 02/21/13 2221  WBC 10.2  NEUTROABS 7.2  HGB 12.1  HCT 35.6*  MCV 88.8  PLT 254   CBG:  Recent Labs Lab 02/21/13 2037 02/21/13 2103 02/21/13 2125 02/21/13 2157 02/21/13 2256  GLUCAP 77 39* 158* 136* 139*    Radiological Exams on Admission: No results found.  EKG: Normal sinus rhythm, no ST/T wave changes  Debbora Presto, MD  [facility] Pager [phone]  If  7PM-7AM, please contact night-coverage [email or web address] Password [facility] 02/21/2013, 11:38 PM

## 2013-02-21 NOTE — ED Notes (Signed)
Phlebotomy enroute to draw blood.

## 2013-02-21 NOTE — ED Notes (Signed)
CBG Taken = 136

## 2013-02-21 NOTE — ED Notes (Signed)
Attempted multiple times to get oral temperature, was successful getting rectal temp of 92.7. Dr. Bernette MayersSheldon notified.

## 2013-02-21 NOTE — ED Notes (Signed)
Pt placed on bair hugger

## 2013-02-21 NOTE — ED Notes (Signed)
Per EMS pt came from home where she was found unresponsive. Family initiated CPR at home. When EMS arrived pt CBG was 22. EMS gave 1 amp of D50 - CBG increased to 250. CBG checked 15 minutes PTA was 125. Upon arrival to ED CBG is 77. Pt reports feeling nausea for the past few days and has had a decreased appetite. VSS. Pt AAOx4 upon arrival.

## 2013-02-21 NOTE — ED Notes (Signed)
CBG 158  

## 2013-02-22 ENCOUNTER — Encounter (HOSPITAL_COMMUNITY): Payer: Self-pay | Admitting: Family Medicine

## 2013-02-22 ENCOUNTER — Inpatient Hospital Stay (HOSPITAL_COMMUNITY): Payer: Medicare HMO

## 2013-02-22 DIAGNOSIS — N390 Urinary tract infection, site not specified: Secondary | ICD-10-CM | POA: Insufficient documentation

## 2013-02-22 DIAGNOSIS — I639 Cerebral infarction, unspecified: Secondary | ICD-10-CM | POA: Insufficient documentation

## 2013-02-22 DIAGNOSIS — I10 Essential (primary) hypertension: Secondary | ICD-10-CM

## 2013-02-22 DIAGNOSIS — E119 Type 2 diabetes mellitus without complications: Secondary | ICD-10-CM

## 2013-02-22 LAB — GLUCOSE, CAPILLARY
GLUCOSE-CAPILLARY: 139 mg/dL — AB (ref 70–99)
Glucose-Capillary: 115 mg/dL — ABNORMAL HIGH (ref 70–99)
Glucose-Capillary: 140 mg/dL — ABNORMAL HIGH (ref 70–99)
Glucose-Capillary: 175 mg/dL — ABNORMAL HIGH (ref 70–99)
Glucose-Capillary: 200 mg/dL — ABNORMAL HIGH (ref 70–99)
Glucose-Capillary: 231 mg/dL — ABNORMAL HIGH (ref 70–99)

## 2013-02-22 LAB — BASIC METABOLIC PANEL
BUN: 14 mg/dL (ref 6–23)
CALCIUM: 8.5 mg/dL (ref 8.4–10.5)
CO2: 26 meq/L (ref 19–32)
Chloride: 102 mEq/L (ref 96–112)
Creatinine, Ser: 0.69 mg/dL (ref 0.50–1.10)
GFR, EST NON AFRICAN AMERICAN: 89 mL/min — AB (ref >90)
Glucose, Bld: 180 mg/dL — ABNORMAL HIGH (ref 70–99)
Potassium: 4.2 mEq/L (ref 3.7–5.3)
SODIUM: 142 meq/L (ref 137–147)

## 2013-02-22 LAB — CBC
HCT: 33.2 % — ABNORMAL LOW (ref 36.0–46.0)
Hemoglobin: 11.3 g/dL — ABNORMAL LOW (ref 12.0–15.0)
MCH: 29.9 pg (ref 26.0–34.0)
MCHC: 34 g/dL (ref 30.0–36.0)
MCV: 87.8 fL (ref 78.0–100.0)
PLATELETS: 278 10*3/uL (ref 150–400)
RBC: 3.78 MIL/uL — ABNORMAL LOW (ref 3.87–5.11)
RDW: 13.7 % (ref 11.5–15.5)
WBC: 8.1 10*3/uL (ref 4.0–10.5)

## 2013-02-22 LAB — PHOSPHORUS: Phosphorus: 3.6 mg/dL (ref 2.3–4.6)

## 2013-02-22 LAB — HEMOGLOBIN A1C
HEMOGLOBIN A1C: 10.8 % — AB (ref <5.7)
Mean Plasma Glucose: 263 mg/dL — ABNORMAL HIGH (ref <117)

## 2013-02-22 LAB — RAPID URINE DRUG SCREEN, HOSP PERFORMED
Amphetamines: NOT DETECTED
BENZODIAZEPINES: NOT DETECTED
Barbiturates: NOT DETECTED
Cocaine: NOT DETECTED
OPIATES: NOT DETECTED
Tetrahydrocannabinol: NOT DETECTED

## 2013-02-22 LAB — MAGNESIUM: Magnesium: 1.7 mg/dL (ref 1.5–2.5)

## 2013-02-22 LAB — PRO B NATRIURETIC PEPTIDE: Pro B Natriuretic peptide (BNP): 164.3 pg/mL — ABNORMAL HIGH (ref 0–125)

## 2013-02-22 LAB — TROPONIN I

## 2013-02-22 LAB — TSH: TSH: 0.909 u[IU]/mL (ref 0.350–4.500)

## 2013-02-22 MED ORDER — TIZANIDINE HCL 4 MG PO TABS
4.0000 mg | ORAL_TABLET | Freq: Two times a day (BID) | ORAL | Status: DC | PRN
  Filled 2013-02-22: qty 2

## 2013-02-22 MED ORDER — ASPIRIN EC 81 MG PO TBEC
81.0000 mg | DELAYED_RELEASE_TABLET | Freq: Every day | ORAL | Status: DC
  Administered 2013-02-22 – 2013-02-23 (×2): 81 mg via ORAL
  Filled 2013-02-22 (×2): qty 1

## 2013-02-22 MED ORDER — SODIUM CHLORIDE 0.9 % IJ SOLN
3.0000 mL | Freq: Two times a day (BID) | INTRAMUSCULAR | Status: DC
  Administered 2013-02-22: 3 mL via INTRAVENOUS

## 2013-02-22 MED ORDER — ONDANSETRON HCL 4 MG PO TABS: 4.0000 mg | ORAL_TABLET | Freq: Four times a day (QID) | ORAL | Status: DC | PRN

## 2013-02-22 MED ORDER — SIMVASTATIN 10 MG PO TABS
10.0000 mg | ORAL_TABLET | Freq: Every day | ORAL | Status: DC
  Administered 2013-02-22: 10 mg via ORAL
  Filled 2013-02-22 (×2): qty 1

## 2013-02-22 MED ORDER — BUPROPION HCL ER (XL) 150 MG PO TB24
150.0000 mg | ORAL_TABLET | Freq: Three times a day (TID) | ORAL | Status: DC | PRN
  Filled 2013-02-22: qty 1

## 2013-02-22 MED ORDER — ONDANSETRON HCL 4 MG/2ML IJ SOLN: 4.0000 mg | Freq: Four times a day (QID) | INTRAMUSCULAR | Status: DC | PRN

## 2013-02-22 MED ORDER — HYDROCODONE-ACETAMINOPHEN 5-325 MG PO TABS: 1.0000 | ORAL_TABLET | ORAL | Status: DC | PRN

## 2013-02-22 MED ORDER — CLONAZEPAM 1 MG PO TABS
1.0000 mg | ORAL_TABLET | Freq: Every day | ORAL | Status: DC
  Administered 2013-02-22: 1 mg via ORAL
  Filled 2013-02-22: qty 1

## 2013-02-22 MED ORDER — LISINOPRIL 40 MG PO TABS
40.0000 mg | ORAL_TABLET | Freq: Every day | ORAL | Status: DC
  Administered 2013-02-22 – 2013-02-23 (×2): 40 mg via ORAL
  Filled 2013-02-22 (×3): qty 1

## 2013-02-22 MED ORDER — CIPROFLOXACIN HCL 500 MG PO TABS
500.0000 mg | ORAL_TABLET | Freq: Two times a day (BID) | ORAL | Status: DC
  Administered 2013-02-22 – 2013-02-23 (×3): 500 mg via ORAL
  Filled 2013-02-22 (×5): qty 1

## 2013-02-22 MED ORDER — INSULIN ASPART 100 UNIT/ML ~~LOC~~ SOLN
12.0000 [IU] | Freq: Three times a day (TID) | SUBCUTANEOUS | Status: DC
  Administered 2013-02-22 – 2013-02-23 (×2): 12 [IU] via SUBCUTANEOUS

## 2013-02-22 MED ORDER — CLOPIDOGREL BISULFATE 75 MG PO TABS
75.0000 mg | ORAL_TABLET | Freq: Every day | ORAL | Status: DC
  Administered 2013-02-22 – 2013-02-23 (×2): 75 mg via ORAL
  Filled 2013-02-22 (×3): qty 1

## 2013-02-22 MED ORDER — METOPROLOL SUCCINATE ER 100 MG PO TB24
100.0000 mg | ORAL_TABLET | Freq: Every day | ORAL | Status: DC
  Administered 2013-02-22 – 2013-02-23 (×2): 100 mg via ORAL
  Filled 2013-02-22 (×2): qty 1

## 2013-02-22 MED ORDER — INSULIN ASPART 100 UNIT/ML ~~LOC~~ SOLN
0.0000 [IU] | Freq: Three times a day (TID) | SUBCUTANEOUS | Status: DC
  Administered 2013-02-22 – 2013-02-23 (×2): 3 [IU] via SUBCUTANEOUS
  Administered 2013-02-23: 2 [IU] via SUBCUTANEOUS

## 2013-02-22 MED ORDER — ENOXAPARIN SODIUM 40 MG/0.4ML ~~LOC~~ SOLN
40.0000 mg | SUBCUTANEOUS | Status: DC
  Administered 2013-02-22 – 2013-02-23 (×2): 40 mg via SUBCUTANEOUS
  Filled 2013-02-22 (×2): qty 0.4

## 2013-02-22 MED ORDER — AMLODIPINE BESYLATE 10 MG PO TABS
10.0000 mg | ORAL_TABLET | Freq: Every day | ORAL | Status: DC
  Administered 2013-02-22 – 2013-02-23 (×2): 10 mg via ORAL
  Filled 2013-02-22 (×2): qty 1

## 2013-02-22 NOTE — Evaluation (Signed)
Physical Therapy Evaluation Patient Details Name: Sylvia Black MRN: [id number] DOB: 02/06/1945 Today's Date: 02/22/2013 Time: [id number] PT Time Calculation (min): 25 min  PT Assessment / Plan / Recommendation History of Present Illness  Patient is a 68 yo female admitted with AMS due to severe hypoglycemia (CBG in 30's).  Patient with h/o DM, HTN, CVA with Lt hemiparesis  Clinical Impression  Patient able to ambulate with RW with supervision 34'.  Good balance with RW.  Patient at baseline functional level.  Has 24 hour assist at home.  No acute PT needs identified - PT will sign off.  Encouraged ambulation in room/hallway with nursing assist.    PT Assessment  Patent does not need any further PT services    Follow Up Recommendations  No PT follow up;Supervision/Assistance - 24 hour    Does the patient have the potential to tolerate intense rehabilitation      Barriers to Discharge        Equipment Recommendations  None recommended by PT    Recommendations for Other Services     Frequency      Precautions / Restrictions Precautions Precautions: None Restrictions Weight Bearing Restrictions: No   Pertinent Vitals/Pain       Mobility  Bed Mobility Overal bed mobility: Modified Independent General bed mobility comments: Uses rail to move to sitting.   Transfers Overall transfer level: Modified independent Equipment used: Rolling walker (2 wheeled) General transfer comment: No physical assist needed.  Good balance with RW in standing Ambulation/Gait Ambulation/Gait assistance: Supervision Ambulation Distance (Feet): 34 Feet Assistive device: Rolling walker (2 wheeled) Gait Pattern/deviations: Step-through pattern;Decreased stance time - left;Decreased step length - right;Decreased stride length;Trunk flexed Gait velocity: Slow gait speed Gait velocity interpretation: Below normal speed for age/gender General Gait Details: Patient with decreased strength LLE  impacting gait, with decreased stance time on LLE.         PT Goals(Current goals can be found in the care plan section)    Visit Information  Last PT Received On: 02/22/13 Assistance Needed: +1 History of Present Illness: Patient is a 68 yo female admitted with AMS due to severe hypoglycemia (CBG in 30's).  Patient with h/o DM, HTN, CVA with Lt hemiparesis       Prior Functioning  Home Living Family/patient expects to be discharged to:: Private residence Living Arrangements: Children;Other relatives (Grandson) Available Help at Discharge: Family;Available 24 hours/day (Daughter works from home) Type of Home: House Home Access: Stairs to enter Secretary/administratorntrance Stairs-Number of Steps: 1 Entrance Stairs-Rails: None Home Layout: One level Home Equipment: Environmental consultantWalker - 4 wheels;Wheelchair - Fluor Corporationmanual;Bedside commode;Shower seat Prior Function Level of Independence: Independent with assistive device(s);Needs assistance Gait / Transfers Assistance Needed: Uses rollator for short distances.  Uses w/c for longer distances and outside. ADL's / Homemaking Assistance Needed: Assist for lower body dressing, meal prep, and washing/drying back in shower. Communication Communication: No difficulties    Cognition  Cognition Arousal/Alertness: Awake/alert Behavior During Therapy: WFL for tasks assessed/performed Overall Cognitive Status: Within Functional Limits for tasks assessed    Extremity/Trunk Assessment Upper Extremity Assessment Upper Extremity Assessment: LUE deficits/detail LUE Deficits / Details: Strength 4-/5 from CVA. LUE Sensation: decreased light touch Lower Extremity Assessment Lower Extremity Assessment: Generalized weakness;LLE deficits/detail LLE Deficits / Details: Strength grossly 4/5 LLE Sensation: decreased light touch LLE Coordination: decreased gross motor   Balance Balance Overall balance assessment: Modified Independent  End of Session PT - End of Session Equipment  Utilized During Treatment: Gait belt Activity  Tolerance: Patient tolerated treatment well Patient left: in bed;with call bell/phone within reach;with family/visitor present Nurse Communication: Mobility status  GP     Vena Austria 02/22/2013, 4:57 PM Durenda Hurt. Renaldo Fiddler, [facility] Acute [facility]hab [facility] Pager [phone]

## 2013-02-22 NOTE — Progress Notes (Signed)
PHYSICIAN PROGRESS NOTE  Philippa Chestereresa P Bejarano NWG:[id number] DOB: 12/17/1945 DOA: 02/21/2013  02/22/2013   PCP: Ailene RavelHAMRICK,MAURA L, MD  Assessment/Plan: Altered mental status  - Most likely secondary to hypoglycemic event which was triggered by insulin in the setting of no oral intake  - Patient is currently clinically stable, alert and oriented x3  - Admitted to telemetry floor for further evaluation   Diabetes mellitus, type 2 on insulin  - A1c pending  - Holding home insulin regimen for now   Severe Hypoglycemia from Insulin - Pt's blood sugars are in the 150 range now - Pt on dextrose IVFs - Start carb modified diet this morning - check AC HS cbg and supplemental insulin (sensitive) ordered - Novolog 12 units TID after meals, 8 units if pt eats 25-50% of meal - No mealtime bolus insulin if eats less than 25% of meal  Hypertension  - Blood pressure stable on admission, resume home medical regimen   Hypothermia - check blood cultures  UTI Pt reports that she has had some recent dysuria and history of frequent UTIs Urine culture pending cipro 500 mg po bid   Code Status: Full  Family Communication: Pt at bedside  Disposition Plan: Admit to telemetry bed  HPI/Subjective: Pt reports that she is feeling much better.    Objective: Filed Vitals:   02/22/13 0302  BP: 159/78  Pulse: 67  Temp: 97.7 F (36.5 C)  Resp: 18    Intake/Output Summary (Last 24 hours) at 02/22/13 0740 Last data filed at 02/21/13 2341  Gross per 24 hour  Intake      0 ml  Output    810 ml  Net   -810 ml   Filed Weights   02/21/13 2045 02/22/13 0302  Weight: 203 lb (92.08 kg) 210 lb 5.1 oz (95.4 kg)    Exam:   General:  Awake, alert, no apparent distress  Cardiovascular: normal s1, s2 sounds   Respiratory: BBS clear to auscultation  Abdomen: soft, obese, nondistended, nontender, no masses  Musculoskeletal: no cyanosis   Data Reviewed: Basic Metabolic Panel:  Recent Labs Lab  02/21/13 2221 02/22/13 0537  NA 139 142  K 3.8 4.2  CL 99 102  CO2 22 26  GLUCOSE 135* 180*  BUN 17 14  CREATININE 0.70 0.69  CALCIUM 8.8 8.5  MG  --  1.7  PHOS  --  3.6   Liver Function Tests:  Recent Labs Lab 02/21/13 2221  AST 29  ALT 25  ALKPHOS 71  BILITOT <0.2*  PROT 6.7  ALBUMIN 3.0*   No results found for this basename: LIPASE, AMYLASE,  in the last 168 hours No results found for this basename: AMMONIA,  in the last 168 hours CBC:  Recent Labs Lab 02/21/13 2221 02/22/13 0537  WBC 10.2 8.1  NEUTROABS 7.2  --   HGB 12.1 11.3*  HCT 35.6* 33.2*  MCV 88.8 87.8  PLT 254 278   Cardiac Enzymes:  Recent Labs Lab 02/22/13 0531  TROPONINI <0.30   BNP (last 3 results)  Recent Labs  02/22/13 0531  PROBNP 164.3*   CBG:  Recent Labs Lab 02/21/13 2157 02/21/13 2256 02/22/13 0042 02/22/13 0331 02/22/13 0612  GLUCAP 136* 139* 115* 139* 140*    No results found for this or any previous visit (from the past 240 hour(s)).   Studies: No results found.  Scheduled Meds: . amLODipine  10 mg Oral Daily  . aspirin EC  81 mg Oral Daily  .  clonazePAM  1 mg Oral QHS  . clopidogrel  75 mg Oral Q breakfast  . enoxaparin (LOVENOX) injection  40 mg Subcutaneous Q24H  . insulin aspart  0-9 Units Subcutaneous TID WC  . insulin aspart  12 Units Subcutaneous TID WC  . lisinopril  40 mg Oral QPC breakfast  . metoprolol succinate  100 mg Oral Daily  . simvastatin  10 mg Oral q1800  . sodium chloride  3 mL Intravenous Q12H   Continuous Infusions: . sodium chloride Stopped (02/21/13 2117)  . dextrose 5 % and 0.45% NaCl 75 mL/hr at 02/21/13 2115    Active Problems:   Hypoglycemia  [facility]  [facility] Pager [phone]. If 7PM-7AM, please contact night-coverage at [email or web address], password [facility] 02/22/2013, 7:40 AM  LOS: 1 day

## 2013-02-22 NOTE — ED Notes (Signed)
Transporting patient to new room assignment. 

## 2013-02-22 NOTE — ED Notes (Signed)
Spoke to rapid response, to wait one hour after pt off bair hugger and as long as pt temp is stable, pt can be transferred.

## 2013-02-22 NOTE — Progress Notes (Signed)
Chaplain responded to spiritual care consult request for Advance Directive. Pt was alert and oriented. 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 She was also familiar with the Living Will. Chaplain reviewed documents with pt and pt's daughter, Judeth CornfieldStephanie, answering all of their questions. Pt will completely Advance Directive and send for chaplain when she is ready to notarize it or if she has additional questions. They were both grateful for support and information.   Please page when requested.  Maurene CapesHillary D Irusta [phone] General: [phone]

## 2013-02-22 NOTE — ED Notes (Addendum)
Sylvia AmmonsChrysta Black (Daughter) [phone] House [phone] cell

## 2013-02-23 LAB — URINE CULTURE: Colony Count: >=100000

## 2013-02-23 LAB — BASIC METABOLIC PANEL
BUN: 8 mg/dL (ref 6–23)
CHLORIDE: 103 meq/L (ref 96–112)
CO2: 23 mEq/L (ref 19–32)
Calcium: 8.8 mg/dL (ref 8.4–10.5)
Creatinine, Ser: 0.65 mg/dL (ref 0.50–1.10)
GFR calc Af Amer: >90 mL/min (ref >90)
Glucose, Bld: 194 mg/dL — ABNORMAL HIGH (ref 70–99)
POTASSIUM: 4.2 meq/L (ref 3.7–5.3)
SODIUM: 141 meq/L (ref 137–147)

## 2013-02-23 LAB — GLUCOSE, CAPILLARY
GLUCOSE-CAPILLARY: 168 mg/dL — AB (ref 70–99)
Glucose-Capillary: 212 mg/dL — ABNORMAL HIGH (ref 70–99)

## 2013-02-23 MED ORDER — INSULIN NPH ISOPHANE & REGULAR (70-30) 100 UNIT/ML ~~LOC~~ SUSP: 20.0000 [IU] | Freq: Two times a day (BID) | SUBCUTANEOUS | Status: DC

## 2013-02-23 MED ORDER — CIPROFLOXACIN HCL 500 MG PO TABS: 500.0000 mg | ORAL_TABLET | Freq: Two times a day (BID) | ORAL | Status: DC

## 2013-02-23 NOTE — Progress Notes (Signed)
Attempted to see pt today.  Noted on PT note that pt has returned to baseline.  Spoke with pt about her self care.  There are a few things she needs assist with since she had her stroke.  Offered to shower her some adaptive equipment to assist her with these things.  Pt not interested.  Will sign off. Tory EmeraldHolly Soderlund, [place] [phone]

## 2013-02-23 NOTE — Progress Notes (Signed)
Pt discharged per MD order and protocol. Discharge instructions reviewed with patient and all questions answered. Pt aware of follow up appointments and given prescriptions.

## 2013-02-23 NOTE — Discharge Summary (Signed)
PHYSICIAN DISCHARGE SUMMARY   Sylvia Black:[id number] DOB: 06-24-1945 DOA: 02/21/2013  PCP: [name], MD  Admit date: 02/21/2013 Discharge date: 02/23/2013  Recommendations for Outpatient Follow-up:  1. Monitor blood glucose and adjust diabetes medications 2. Help patient establish care with endocrinology  Discharge Diagnoses:  Active Problems:   Hypoglycemia  Discharge Condition: stable   Diet recommendation: carb modified, heart healthy  Filed Weights   02/21/13 2045 02/22/13 0302 02/23/13 0456  Weight: 203 lb (92.08 kg) 210 lb 5.1 oz (95.4 kg) 208 lb 1.8 oz (94.4 kg)   History of present illness:  Patient is 68 year old female with a history of diabetes mellitus requiring insulin, hypertension, hyperlipidemia, anxiety who was brought by EMS to [facility] emergency department after found unresponsive at home. Upon EMS arrival, CBG found to be in 30 inpatient hypothermic with temperature 92.5. Currently in emergency department patient is alert and awake, feeling well and able to provide history. She explains she has taken her regular insulin dose but did not eat as she was feeling sick, nausea and malaise. Patient reports feeling weak over the past several days, poor oral intake, subjective fevers at home but she has never checked her temperature. She currently denies chest pain and shortness of breath, no specific abdominal or urinary concerns. She denies similar events in the past.  In emergency department, patient stable hemodynamically, temperature 92.6 Fahrenheit, last CBG 126. [facility] hospitalist asked to admit to telemetry bed for further evaluation  Hospital Course:  Altered mental status  - Most likely secondary to hypoglycemic event which was triggered by insulin in the setting of no oral intake  - Patient is currently clinically stable, alert and oriented x3  - Admitted to telemetry floor for further evaluation   Diabetes mellitus, type 2 on insulin  - A1c pending    - Holding home insulin regimen for now   Severe Hypoglycemia from Insulin  - Pt's blood sugars are in the 150 range now  No recurrence of hypoglycemia - carb modified diet - check AC HS cbg and supplemental insulin (sensitive) ordered  - send home on lower doses of insulin   Hypertension  - Blood pressure stable on admission, resume home medical regimen   Hypothermia  Resolved now - checked blood cultures - no growth to date   UTI  Pt reports that she has had some recent dysuria and history of frequent UTIs  cipro 500 mg po bid   Code Status: Full  Family Communication: Pt at bedside  Disposition Plan: Admit to telemetry bed  Discharge Exam: Pt without complaints.  No further hypoglycemia.  Temp normal.  Filed Vitals:   02/23/13 0456  BP: 141/61  Pulse: 70  Temp: 98.9 F (37.2 C)  Resp: 18   General: awake, alert, no distress cooperative Cardiovascular: normal s1, s2 sounds  Respiratory: BBS clear to auscultation Neuro: nonfocal  Discharge Instructions  Discharge Orders   Future Orders Complete By Expires   Discharge instructions  As directed    Comments:     Return if symptoms recur, worsen or new problems develop.  Check BS 4 times per day and report to primary care physician Establish care with an endocrinologist as soon as possible   Discontinue IV  As directed    Increase activity slowly  As directed        Medication List    TAKE these medications       amLODipine 10 MG tablet  Commonly known as:  NORVASC  Take 10 mg by mouth daily.     aspirin EC 81 MG tablet  Take 81 mg by mouth daily.     AZO CRANBERRY PO  Take 1 capsule by mouth daily as needed (kidney infections).     buPROPion 150 MG 24 hr tablet  Commonly known as:  WELLBUTRIN XL  Take 150 mg by mouth 3 (three) times daily as needed (pain).     ciprofloxacin 500 MG tablet  Commonly known as:  CIPRO  Take 1 tablet (500 mg total) by mouth 2 (two) times daily.     clonazePAM 1 MG  tablet  Commonly known as:  KLONOPIN  Take 1 mg by mouth at bedtime.     clopidogrel 75 MG tablet  Commonly known as:  PLAVIX  Take 75 mg by mouth daily with breakfast.     diclofenac sodium 1 % Gel  Commonly known as:  VOLTAREN  Apply 4 g topically daily as needed (pain).     insulin NPH-regular Human (70-30) 100 UNIT/ML injection  Commonly known as:  NOVOLIN 70/30  Inject 20-50 Units into the skin 2 (two) times daily with a meal. Take 50 units after breakfast and 20 units after dinner     lisinopril 40 MG tablet  Commonly known as:  PRINIVIL,ZESTRIL  Take 40 mg by mouth daily after breakfast.     metFORMIN 500 MG 24 hr tablet  Commonly known as:  GLUCOPHAGE-XR  Take 500 mg by mouth daily with breakfast.     metoprolol succinate 100 MG 24 hr tablet  Commonly known as:  TOPROL-XL  Take 100 mg by mouth daily. Take with or immediately following a meal.     multivitamin with minerals Tabs tablet  Take 1 tablet by mouth daily.     pravastatin 20 MG tablet  Commonly known as:  PRAVACHOL  Take 20 mg by mouth daily.     tiZANidine 4 MG tablet  Commonly known as:  ZANAFLEX  Take 4-8 mg by mouth 2 (two) times daily. Take 1 tablet (4mg ) in the morning and 2 tablets (8mg ) at night      ASK your doctor about these medications       lidocaine 5 %  Commonly known as:  LIDODERM  Place 1-3 patches onto the skin daily. Remove & Discard patch within 12 hours or as directed by MD       Allergies  Allergen Reactions  . Penicillins Hives  . Sulfa Antibiotics     "hives"  . Tetanus Toxoids     "I had a strong reaction".       Follow-up Information   Follow up with [facility], MD. Schedule an appointment as soon as possible for a visit in 1 week. [facility] Followup )    Specialty:  Family Medicine   Contact information:   Dr. Burnell Blanks [address] [place] [address] [phone]       Schedule an appointment as soon as possible for a visit with  [facility] Endocrinology. (Establish diabetes Specialist Appointment)    Specialty:  Internal Medicine   Contact information:   [address] [place], [address] [place] [address] [phone]      The results of significant diagnostics from this hospitalization (including imaging, microbiology, ancillary and laboratory) are listed below for reference.    Significant Diagnostic Studies: X-ray Chest Pa And Lateral   02/22/2013   CLINICAL DATA:  Shortness of breath  EXAM: CHEST  2  VIEW  COMPARISON:  DG CHEST 2 VIEW dated 08/30/2008  FINDINGS: Atherosclerotic calcifications identified within the aorta. The heart size and mediastinal contours are within normal limits. Both lungs are clear. The visualized skeletal structures are unremarkable.  IMPRESSION: No active cardiopulmonary disease.   Electronically Signed   By: Salome Holmes M.D.   On: 02/22/2013 10:28    Microbiology: Recent Results (from the past 240 hour(s))  URINE CULTURE     Status: None   Collection Time    02/21/13 11:24 PM      Result Value Range Status   Specimen Description URINE, CATHETERIZED   Final   Special Requests NONE   Final   Culture  Setup Time     Final   Value: 02/22/2013 09:19     Performed at [facility] Count     Final   Value: >=100,000 COLONIES/ML     Performed at [facility]   Culture     Final   Value: Multiple bacterial morphotypes present, none predominant. Suggest appropriate recollection if clinically indicated.     Performed at [facility]   Report Status 02/23/2013 FINAL   Final  CULTURE, BLOOD (ROUTINE X 2)     Status: None   Collection Time    02/22/13  9:00 AM      Result Value Range Status   Specimen Description BLOOD LEFT ARM   Final   Special Requests BOTTLES DRAWN AEROBIC AND ANAEROBIC 10CC   Final   Culture  Setup Time     Final   Value: 02/22/2013 14:04     Performed at [facility]   Culture     Final   Value:         BLOOD CULTURE RECEIVED NO GROWTH TO DATE CULTURE WILL BE HELD FOR 5 DAYS BEFORE ISSUING A FINAL NEGATIVE REPORT     Performed at [facility]   Report Status PENDING   Incomplete  CULTURE, BLOOD (ROUTINE X 2)     Status: None   Collection Time    02/22/13  9:15 AM      Result Value Range Status   Specimen Description BLOOD RIGHT HAND   Final   Special Requests BOTTLES DRAWN AEROBIC AND ANAEROBIC 5CC   Final   Culture  Setup Time     Final   Value: 02/22/2013 14:07     Performed at [facility]   Culture     Final   Value:        BLOOD CULTURE RECEIVED NO GROWTH TO DATE CULTURE WILL BE HELD FOR 5 DAYS BEFORE ISSUING A FINAL NEGATIVE REPORT     Performed at [facility]   Report Status PENDING   Incomplete     Labs: Basic Metabolic Panel:  Recent Labs Lab 02/21/13 2221 02/22/13 0537 02/23/13 0350  NA 139 142 141  K 3.8 4.2 4.2  CL 99 102 103  CO2 22 26 23   GLUCOSE 135* 180* 194*  BUN 17 14 8   CREATININE 0.70 0.69 0.65  CALCIUM 8.8 8.5 8.8  MG  --  1.7  --   PHOS  --  3.6  --    Liver Function Tests:  Recent Labs Lab 02/21/13 2221  AST 29  ALT 25  ALKPHOS 71  BILITOT <0.2*  PROT 6.7  ALBUMIN 3.0*   No results found for this basename: LIPASE, AMYLASE,  in the last 168 hours No results found  for this basename: AMMONIA,  in the last 168 hours CBC:  Recent Labs Lab 02/21/13 2221 02/22/13 0537  WBC 10.2 8.1  NEUTROABS 7.2  --   HGB 12.1 11.3*  HCT 35.6* 33.2*  MCV 88.8 87.8  PLT 254 278   Cardiac Enzymes:  Recent Labs Lab 02/22/13 0531  TROPONINI <0.30   BNP: BNP (last 3 results)  Recent Labs  02/22/13 0531  PROBNP 164.3*   CBG:  Recent Labs Lab 02/22/13 1120 02/22/13 1624 02/22/13 2124 02/23/13 0612 02/23/13 1123  GLUCAP 200* 231* 175* 168* 212*   Signed:  Clanford Johnson  [facility] 02/23/2013, 12:39 PM

## 2013-02-28 LAB — CULTURE, BLOOD (ROUTINE X 2)
Culture: NO GROWTH
Culture: NO GROWTH

## 2013-09-30 ENCOUNTER — Inpatient Hospital Stay: Payer: Self-pay | Admitting: Internal Medicine

## 2013-09-30 LAB — CBC WITH DIFFERENTIAL/PLATELET
Basophil #: 0.1 10*3/uL (ref 0.0–0.1)
Basophil %: 0.4 %
Eosinophil #: 0 10*3/uL (ref 0.0–0.7)
Eosinophil %: 0.1 %
HCT: 38.3 % (ref 35.0–47.0)
HGB: 12.1 g/dL (ref 12.0–16.0)
LYMPHS ABS: 2.8 10*3/uL (ref 1.0–3.6)
LYMPHS PCT: 14 %
MCH: 29.3 pg (ref 26.0–34.0)
MCHC: 31.5 g/dL — AB (ref 32.0–36.0)
MCV: 93 fL (ref 80–100)
MONOS PCT: 7.4 %
Monocyte #: 1.5 x10 3/mm — ABNORMAL HIGH (ref 0.2–0.9)
NEUTROS ABS: 15.5 10*3/uL — AB (ref 1.4–6.5)
Neutrophil %: 78.1 %
PLATELETS: 442 10*3/uL — AB (ref 150–440)
RBC: 4.11 10*6/uL (ref 3.80–5.20)
RDW: 13.3 % (ref 11.5–14.5)
WBC: 19.8 10*3/uL — ABNORMAL HIGH (ref 3.6–11.0)

## 2013-09-30 LAB — URINALYSIS, COMPLETE
BILIRUBIN, UR: NEGATIVE
NITRITE: NEGATIVE
Ph: 5 (ref 4.5–8.0)
Protein: 30
RBC,UR: 69 /HPF (ref 0–5)
SQUAMOUS EPITHELIAL: NONE SEEN
Specific Gravity: 1.021 (ref 1.003–1.030)
WBC UR: 726 /HPF (ref 0–5)

## 2013-09-30 LAB — COMPREHENSIVE METABOLIC PANEL
ANION GAP: 20 — AB (ref 7–16)
Albumin: 2.9 g/dL — ABNORMAL LOW (ref 3.4–5.0)
Alkaline Phosphatase: 98 U/L
BUN: 18 mg/dL (ref 7–18)
Bilirubin,Total: 0.5 mg/dL (ref 0.2–1.0)
CO2: 18 mmol/L — AB (ref 21–32)
CREATININE: 0.99 mg/dL (ref 0.60–1.30)
Calcium, Total: 9.1 mg/dL (ref 8.5–10.1)
Chloride: 86 mmol/L — ABNORMAL LOW (ref 98–107)
EGFR (African American): >60
GFR CALC NON AF AMER: 59 — AB
GLUCOSE: 596 mg/dL — AB (ref 65–99)
OSMOLALITY: 279 (ref 275–301)
Potassium: 5 mmol/L (ref 3.5–5.1)
SGOT(AST): 13 U/L — ABNORMAL LOW (ref 15–37)
SGPT (ALT): 13 U/L — ABNORMAL LOW
SODIUM: 124 mmol/L — AB (ref 136–145)
TOTAL PROTEIN: 8.6 g/dL — AB (ref 6.4–8.2)

## 2013-09-30 LAB — TROPONIN I

## 2013-10-01 LAB — BASIC METABOLIC PANEL
ANION GAP: 9 (ref 7–16)
Anion Gap: 14 (ref 7–16)
BUN: 17 mg/dL (ref 7–18)
BUN: 14 mg/dL (ref 7–18)
CALCIUM: 8.8 mg/dL (ref 8.5–10.1)
CHLORIDE: 88 mmol/L — AB (ref 98–107)
CO2: 21 mmol/L (ref 21–32)
CO2: 25 mmol/L (ref 21–32)
CREATININE: 0.91 mg/dL (ref 0.60–1.30)
Calcium, Total: 8.4 mg/dL — ABNORMAL LOW (ref 8.5–10.1)
Chloride: 95 mmol/L — ABNORMAL LOW (ref 98–107)
Creatinine: 1.12 mg/dL (ref 0.60–1.30)
EGFR (African American): >60
GFR CALC AF AMER: 59 — AB
GFR CALC NON AF AMER: 51 — AB
Glucose: 538 mg/dL (ref 65–99)
Glucose: 249 mg/dL — ABNORMAL HIGH (ref 65–99)
OSMOLALITY: 268 (ref 275–301)
Osmolality: 274 (ref 275–301)
POTASSIUM: 4.5 mmol/L (ref 3.5–5.1)
Potassium: 4 mmol/L (ref 3.5–5.1)
SODIUM: 129 mmol/L — AB (ref 136–145)
Sodium: 123 mmol/L — ABNORMAL LOW (ref 136–145)

## 2013-10-01 LAB — CBC WITH DIFFERENTIAL/PLATELET
BASOS PCT: 0.6 %
Basophil #: 0.1 10*3/uL (ref 0.0–0.1)
EOS PCT: 0.4 %
Eosinophil #: 0.1 10*3/uL (ref 0.0–0.7)
HCT: 33.3 % — ABNORMAL LOW (ref 35.0–47.0)
HGB: 10.6 g/dL — AB (ref 12.0–16.0)
Lymphocyte #: 3.7 10*3/uL — ABNORMAL HIGH (ref 1.0–3.6)
Lymphocyte %: 23.9 %
MCH: 28.9 pg (ref 26.0–34.0)
MCHC: 31.9 g/dL — AB (ref 32.0–36.0)
MCV: 91 fL (ref 80–100)
Monocyte #: 1.6 x10 3/mm — ABNORMAL HIGH (ref 0.2–0.9)
Monocyte %: 10.3 %
NEUTROS ABS: 10.1 10*3/uL — AB (ref 1.4–6.5)
Neutrophil %: 64.8 %
PLATELETS: 367 10*3/uL (ref 150–440)
RBC: 3.68 10*6/uL — ABNORMAL LOW (ref 3.80–5.20)
RDW: 13.1 % (ref 11.5–14.5)
WBC: 15.6 10*3/uL — AB (ref 3.6–11.0)

## 2013-10-01 LAB — LIPID PANEL
Cholesterol: 135 mg/dL (ref 0–200)
HDL Cholesterol: 39 mg/dL — ABNORMAL LOW (ref 40–60)
LDL CHOLESTEROL, CALC: 34 mg/dL (ref 0–100)
TRIGLYCERIDES: 308 mg/dL — AB (ref 0–200)
VLDL CHOLESTEROL, CALC: 62 mg/dL — AB (ref 5–40)

## 2013-10-01 LAB — SODIUM: SODIUM: 131 mmol/L — AB (ref 136–145)

## 2013-10-01 LAB — HEMOGLOBIN A1C: HEMOGLOBIN A1C: 15.7 % — AB (ref 4.2–6.3)

## 2013-10-04 LAB — URINE CULTURE

## 2013-10-05 LAB — CULTURE, BLOOD (SINGLE)

## 2014-05-14 NOTE — Discharge Summary (Signed)
PATIENT NAME:  Sylvia Black, Delcenia P MR#:  [id number] DATE OF BIRTH:  12-18-1945  DATE OF ADMISSION:  09/30/2013 DATE OF DISCHARGE:  10/01/2013  ADMITTING DIAGNOSIS: Diabetic ketoacidosis.  DISCHARGE DIAGNOSES: 1. Diabetic ketoacidosis due to noncompliance.  2. Diabetes mellitus, insulin-dependent, uncontrolled, with hemoglobin A1c 15.7.  3. Nausea and vomiting due to diabetic ketoacidosis, resolved.  4. Urinary tract infection of unclear etiology at present.  5. Hyponatremia.  6. Dehydration.  7. Leukocytosis.  8. Anemia.  9. Hyperlipidemia.  10. Hypertriglyceridemia with triglyceride level of 308.  11. History of hypertension.  12. Stroke.  13. Recurrent urinary tract infections.   DISCHARGE CONDITION: Stable.   DISCHARGE MEDICATIONS: The patient is to resume:  1. Insulin 70/30 at 60 units in the morning, 40 units in the evening.  2. Pravachol 20 mg p.o. at bedtime. 3. Plavix 75 mg p.o. daily.  4. Amlodipine 10 mg p.o. daily. 5. Keflex 500 mg every 8 hours for 5 more days.  6. Meclizine 25 mg p.o. twice daily.  7. Metformin extended release 1000 mg once daily.  8. Aspirin 81 mg p.o. daily.  9. Lisinopril 10 mg p.o. daily.   HOME OXYGEN: None.   DIET: 2 gram salt, low-fat, low-cholesterol, carbohydrate-controlled diet, regular consistency.   ACTIVITY LIMITATIONS: As tolerated.   REFERRALS: The patient is referred to home health physical therapy, RN as well as aide.   FOLLOWUP APPOINTMENTS: [facility] in 1 week after discharge. Also, [facility] followup as outpatient to ensure compliance and management of diabetes.   CONSULTANTS: Care management, social work, [facility] and dietary.   RADIOLOGIC STUDIES: None.   HISTORY AND HOSPITAL COURSE: The patient is a 69 year old Caucasian female with past medical history significant for history of diabetes mellitus, who is on insulin, who presented to the hospital with complaints of not feeling well. The patient had dizziness,  burning in the urine as well as had nausea and vomiting. Please refer to H and P dictated by Dr. Elisabeth PigeonVachhani on 09/30/2013. On arrival to the hospital, her temperature was 97.7, pulse was 105, respiration rate was 18, blood pressure 178/81, saturation was 97% on room air. Physical exam was remarkable for dry oral mucosa and tachycardia. No other abnormalities were noted. The patient's lab data done on arrival to the Emergency Room showed sodium level of 124, glucose of 196, bicarbonate level was 18 and anion gap was 20. The patient's liver enzymes revealed albumin level of 2.9, otherwise unremarkable study. Troponin was less than 0.02. White blood cell count was elevated to 19.8, hemoglobin was 12.1, platelet count was 442, absolute neutrophil count was 15.5. Blood culture x1 did not show any growth. Urinalysis was performed and revealed 726 white blood cells, 69 red blood cells, 3+ leukocyte esterase. However, urine cultures came back positive for more than 100,000 colony-forming units of yeast with mixed bacterial organisms.   The patient was admitted to the hospital with IV insulin drip as well as was rehydrated with IV fluids. Her condition slowly improved. By 10/01/2013, her sodium level improved to 129 and further along to 131. She was able to eat and drink, and her DKA resolved. She was converted to insulin 70/30 and advised to continue her usual doses, which were 60 units in the morning and 40 units at bedtime. She was encouraged to continue medications, and consultations with [facility] as well as dietary were obtained. Social worker helped the patient establish appointment with primary care physician and be seen in the next few weeks  after discharge. The patient was felt to be ready to be discharged home on 10/01/2013. She was advised to keep that appointment with primary care physician. She was also advised to continue medications. Home health nurse, physical therapist as well as aide will be  arranged for her upon discharge. The patient was also recommended to follow up with [facility] to help to manage her diabetes.   On the day of discharge, the patient's vital signs: Temperature was 99.3, pulse was 85, respiration rate was 17, blood pressure 163/66, saturation 98% on room air at rest. The patient was advised to continue therapy for urinary tract infection. However, since the patient's cultures came back also Candida infection, the patient may benefit from Candida treatment as well as outpatient when she sees and establishes with primary care physician. On the day of discharge, she did not complain of any significant discomfort. As mentioned above, she was able to eat and drink fluids. Her sodium level normalized to 134, and her white blood cell count also has improved to 15.6, down from 19.8. It is recommended to follow the patient's white blood cell count as outpatient and make decisions about looking for any other source of infection if it remains elevated or any other blood disease.   TIME SPENT: 40 minutes.   ____________________________ Katharina Caper, MD rv:lb D: 10/02/2013 12:44:58 ET T: 10/02/2013 14:22:05 ET JOB#: [id number]  cc: Katharina Caper, MD, <Dictator> [facility]  Randle Shatzer MD ELECTRONICALLY SIGNED 10/13/2013 14:29

## 2014-05-14 NOTE — H&P (Signed)
PATIENT NAME:  Sylvia Black, Valley P MR#:  [id number] DATE OF BIRTH:  05/19/1945  DATE OF ADMISSION:  09/30/2013  PRIMARY CARE PHYSICIAN: None here as the patient recently [place] [facility] had [facility], currently in search of a primary care in [place], but so far did not go to any appointment in the last 2 to 3 months.  REFERRING EMERGENCY ROOM PHYSICIAN: Dr. Dorothea GlassmanPaul Malinda.   CHIEF COMPLAINT: Dizziness, burning in the urine, high blood sugar.   HISTORY OF PRESENTING ILLNESS: A 69 year old female who has a history of cerebrovascular accident 3 times in the last 5 years, diabetes, hypertension, sleep apnea and recurrent UTIs for the last 6 to 7 months, who is not very compliant with her insulin therapy and diabetes and not checking her blood sugar regularly, but she says that she takes her metformin daily. Actually, she is search an endocrinologist, for which she could not get an appointment yet and that is why she did not want to continue taking a high dose of insulin, so she stopped taking it for the last few weeks, and not checking her blood sugar also to frequently.   For the last 5 to 6 days, she started feeling her UTI symptoms, which she knows very well because she had multiple episodes in the past; includes burning in the urine, some lower back pain and frequency in the urination, gradually getting worse, and now she is also feeling some dizziness when standing up. She had chills, but very low-grade temperature up to 100 degrees Fahrenheit in the last few days. Concerning for this, came to Emergency Room and she was found having elevated blood sugar level up to 590 and elevated white cell count with some anion gap and acidosis on initial workup and was severely dehydrated, was not making any urine, so started on IV fluid bolus and given to hospitalist team for admission for DKA and possible UTI.   REVIEW OF SYSTEMS:  CONSTITUTIONAL: Negative for fever or  fatigue. Positive for chills. No generalized weakness, weight loss or weight gain.  EYES: No blurring, double vision, discharge or redness.  EARS, NOSE, THROAT: No tinnitus, ear pain or hearing loss. RESPIRATORY: No cough, wheezing, hemoptysis or shortness of breath. CARDIOVASCULAR:  No chest pain, orthopnea, edema, arrhythmia, palpitations.  GASTROINTESTINAL: The patient has nausea and vomiting for the last 2 days. No abdominal pain or diarrhea.  GENITOURINARY: The patient has burning and increased frequency of the urination. No blood in the urine.  JOINTS: No swelling or tenderness.  NEUROLOGICAL: No numbness, weakness, tremor or vertigo.  PSYCHIATRIC: No anxiety, insomnia, bipolar disorder.   PAST MEDICAL HISTORY:  Diabetes, hypertension, cerebrovascular accident and recurrent UTIs.   PAST SURGICAL HISTORY:  Had neck fusion surgery, wrist surgery, cataract surgery and gallbladder surgery.   SOCIAL HISTORY: No smoking, no alcohol, no illegal drug use. She is retired.   FAMILY HISTORY: Diabetes in all the family members and her father and mother had coronary artery disease. Her grandmother on father's side had breast cancer.   HOME MEDICATIONS:  1.  Meclizine 25 mg b.i.d. 2.  Plavix 75 mg daily.  3.  Metformin 500 mg 2 tablets once a day.  4.  Lisinopril 20 mg daily.  5.  Amlodipine 10 mg daily.  6.  Metoprolol 100 mg daily.  7.  AZO daily. 8.  Vitamin tablet daily.  9.  Pravastatin 20 mg daily.  10.  Aspirin 81 mg daily.  11.  Insulin  70/30.  She was taking 70 units in a.m. and 40 units in p.m. until 3 weeks ago.  12.  Clonazepam 1 tablet as needed.   PHYSICAL EXAMINATION: VITAL SIGNS: In ER, temperature 97.7, pulse 105, blood pressure 178/81, pulse ox is 97% on room air.  GENERAL: The patient is fully alert and oriented to time, place and person. Does not appear in any acute distress.  HEENT: Head and neck atraumatic. Conjunctivae pink. Oral mucosa dry.  NECK: Supple. No JVD.   RESPIRATORY: Bilateral equal and clear air entry.  CARDIOVASCULAR: S1, S2 present, regular. Some tachycardia heard. No murmur heard.  ABDOMEN: Soft, nontender. Bowel sounds present. No organomegaly.  SKIN: No rashes.  EXTREMITIES:  Legs: No edema.  NEUROLOGICAL: Power 5/5. Follows commands. Moves all 4 limbs. No tremor or rigidity. No numbness.  PSYCHIATRIC: Does not appear in any acute psychiatric illness.  JOINTS: No swelling or tenderness.   IMPORTANT LABORATORY RESULTS: Blood glucose 597, BUN 18, creatinine 0.99, sodium 124, potassium 5.0, chloride 86. CO2 is 18. Calcium is 9.1.   Total protein 8.6, albumin 2.9, bilirubin 0.5, alkaline phosphate 98, SGOT 30 and SGPT 30.   Troponin less than 0.02.   WBC 19.8, hemoglobin 12.1, platelet count 442. MCV is 93.   PCO2 is 29 and pH is 7.35.   ASSESSMENT AND PLAN: A 69 year old female with a past medical history of hypertension, diabetes, cerebrovascular accident and not very compliant with diabetic medications with recurrent urinary tract infections, came here to the Emergency Room after having urinary tract infection-type symptoms for the last 5 to 6 weeks and found having very high blood sugar level.   1.  Diabetic ketoacidosis We will admit her to stepdown unit, put her on insulin IV drip and keep her on oral liquids, mostly water. I encouraged her to  drink a lot of water and check blood sugar every hour and adjust the insulin drip according to that.  2.  Urinary tract infection. The patient had recurrent episode in the last 6 months and she identifies her symptoms very well.   We will start her on Rocephin IV daily and send her urinalysis and urine culture for further identification of bacteria. 3.  Hyponatremia. This is pseudo secondary to her hyperglycemia. Continue IV fluid and continue monitoring.  4.  Severe dehydration. We will give IV fluids 150 mL/hour, 1 liter bolus is given by ER and I encouraged her to drink a lot of water.   5.  Hypertension. Currently appears to be dehydrated, but blood pressure is still running on the slightly higher side, so we will give her amlodipine, but hold her other medications.  6.  History of cerebrovascular accident. We will continue aspirin, Plavix and pravastatin. Other medications to be restarted later on once more stable.   CONDITION:  Critical.   TOTAL TIME SPENT:  50 minutes in admitting this patient and discussing the plan with the patient and her daughter.    ____________________________ Hope Pigeon. Elisabeth Pigeon, MD vgv:dmm D: 09/30/2013 19:55:37 ET T: 09/30/2013 21:30:04 ET JOB#: [id number]  cc: Hope Pigeon. Elisabeth Pigeon, MD, <Dictator> Altamese Dilling MD ELECTRONICALLY SIGNED 10/23/2013 12:45

## 2015-07-01 ENCOUNTER — Inpatient Hospital Stay
Admission: EM | Admit: 2015-07-01 | Discharge: 2015-07-07 | DRG: 637 | Disposition: A | Discharge status: Skilled Nursing Facility | Payer: Medicare HMO | Attending: Internal Medicine | Admitting: Internal Medicine

## 2015-07-01 ENCOUNTER — Emergency Department: Payer: Medicare HMO

## 2015-07-01 DIAGNOSIS — M25519 Pain in unspecified shoulder: Secondary | ICD-10-CM | POA: Diagnosis present

## 2015-07-01 DIAGNOSIS — Z8673 Personal history of transient ischemic attack (TIA), and cerebral infarction without residual deficits: Secondary | ICD-10-CM | POA: Diagnosis not present

## 2015-07-01 DIAGNOSIS — Z7902 Long term (current) use of antithrombotics/antiplatelets: Secondary | ICD-10-CM | POA: Diagnosis not present

## 2015-07-01 DIAGNOSIS — N170 Acute kidney failure with tubular necrosis: Secondary | ICD-10-CM | POA: Diagnosis present

## 2015-07-01 DIAGNOSIS — Z6841 Body Mass Index (BMI) 40.0 and over, adult: Secondary | ICD-10-CM

## 2015-07-01 DIAGNOSIS — Z881 Allergy status to other antibiotic agents status: Secondary | ICD-10-CM

## 2015-07-01 DIAGNOSIS — R4182 Altered mental status, unspecified: Secondary | ICD-10-CM | POA: Diagnosis present

## 2015-07-01 DIAGNOSIS — K219 Gastro-esophageal reflux disease without esophagitis: Secondary | ICD-10-CM | POA: Diagnosis present

## 2015-07-01 DIAGNOSIS — Z794 Long term (current) use of insulin: Secondary | ICD-10-CM | POA: Diagnosis not present

## 2015-07-01 DIAGNOSIS — Z887 Allergy status to serum and vaccine status: Secondary | ICD-10-CM

## 2015-07-01 DIAGNOSIS — G8929 Other chronic pain: Secondary | ICD-10-CM | POA: Diagnosis present

## 2015-07-01 DIAGNOSIS — Z7982 Long term (current) use of aspirin: Secondary | ICD-10-CM

## 2015-07-01 DIAGNOSIS — I6350 Cerebral infarction due to unspecified occlusion or stenosis of unspecified cerebral artery: Secondary | ICD-10-CM | POA: Diagnosis not present

## 2015-07-01 DIAGNOSIS — F0630 Mood disorder due to known physiological condition, unspecified: Secondary | ICD-10-CM

## 2015-07-01 DIAGNOSIS — Z9114 Patient's other noncompliance with medication regimen: Secondary | ICD-10-CM | POA: Diagnosis not present

## 2015-07-01 DIAGNOSIS — E876 Hypokalemia: Secondary | ICD-10-CM | POA: Diagnosis present

## 2015-07-01 DIAGNOSIS — Z7952 Long term (current) use of systemic steroids: Secondary | ICD-10-CM

## 2015-07-01 DIAGNOSIS — E872 Acidosis, unspecified: Secondary | ICD-10-CM | POA: Diagnosis present

## 2015-07-01 DIAGNOSIS — F419 Anxiety disorder, unspecified: Secondary | ICD-10-CM | POA: Diagnosis present

## 2015-07-01 DIAGNOSIS — E785 Hyperlipidemia, unspecified: Secondary | ICD-10-CM | POA: Diagnosis present

## 2015-07-01 DIAGNOSIS — Z7401 Bed confinement status: Secondary | ICD-10-CM

## 2015-07-01 DIAGNOSIS — I10 Essential (primary) hypertension: Secondary | ICD-10-CM | POA: Diagnosis present

## 2015-07-01 DIAGNOSIS — F331 Major depressive disorder, recurrent, moderate: Secondary | ICD-10-CM | POA: Diagnosis present

## 2015-07-01 DIAGNOSIS — Z882 Allergy status to sulfonamides status: Secondary | ICD-10-CM | POA: Diagnosis not present

## 2015-07-01 DIAGNOSIS — Z66 Do not resuscitate: Secondary | ICD-10-CM | POA: Diagnosis present

## 2015-07-01 DIAGNOSIS — E1143 Type 2 diabetes mellitus with diabetic autonomic (poly)neuropathy: Secondary | ICD-10-CM | POA: Diagnosis present

## 2015-07-01 DIAGNOSIS — R4781 Slurred speech: Secondary | ICD-10-CM | POA: Diagnosis present

## 2015-07-01 DIAGNOSIS — L109 Pemphigus, unspecified: Secondary | ICD-10-CM | POA: Diagnosis present

## 2015-07-01 DIAGNOSIS — Z452 Encounter for adjustment and management of vascular access device: Secondary | ICD-10-CM | POA: Diagnosis not present

## 2015-07-01 DIAGNOSIS — F329 Major depressive disorder, single episode, unspecified: Secondary | ICD-10-CM | POA: Diagnosis not present

## 2015-07-01 DIAGNOSIS — Z880 Allergy status to penicillin: Secondary | ICD-10-CM

## 2015-07-01 DIAGNOSIS — Z79899 Other long term (current) drug therapy: Secondary | ICD-10-CM

## 2015-07-01 DIAGNOSIS — E1310 Other specified diabetes mellitus with ketoacidosis without coma: Principal | ICD-10-CM | POA: Diagnosis present

## 2015-07-01 DIAGNOSIS — Z7984 Long term (current) use of oral hypoglycemic drugs: Secondary | ICD-10-CM | POA: Diagnosis not present

## 2015-07-01 DIAGNOSIS — Z7722 Contact with and (suspected) exposure to environmental tobacco smoke (acute) (chronic): Secondary | ICD-10-CM | POA: Diagnosis present

## 2015-07-01 DIAGNOSIS — E1110 Type 2 diabetes mellitus with ketoacidosis without coma: Secondary | ICD-10-CM | POA: Insufficient documentation

## 2015-07-01 DIAGNOSIS — G9340 Encephalopathy, unspecified: Secondary | ICD-10-CM | POA: Diagnosis present

## 2015-07-01 DIAGNOSIS — Z888 Allergy status to other drugs, medicaments and biological substances status: Secondary | ICD-10-CM

## 2015-07-01 LAB — COMPREHENSIVE METABOLIC PANEL
ALT: 20 U/L (ref 14–54)
ANION GAP: UNDETERMINED (ref 5–15)
AST: 16 U/L (ref 15–41)
Albumin: 3.2 g/dL — ABNORMAL LOW (ref 3.5–5.0)
Alkaline Phosphatase: 93 U/L (ref 38–126)
BUN: 13 mg/dL (ref 6–20)
CHLORIDE: 106 mmol/L (ref 101–111)
CO2: <7 mmol/L — ABNORMAL LOW (ref 22–32)
Calcium: 8.9 mg/dL (ref 8.9–10.3)
Creatinine, Ser: 0.96 mg/dL (ref 0.44–1.00)
GFR calc Af Amer: >60 mL/min (ref >60)
GFR, EST NON AFRICAN AMERICAN: 60 mL/min — AB (ref >60)
Glucose, Bld: 488 mg/dL — ABNORMAL HIGH (ref 65–99)
POTASSIUM: 5.2 mmol/L — AB (ref 3.5–5.1)
Sodium: 134 mmol/L — ABNORMAL LOW (ref 135–145)
Total Bilirubin: 1.6 mg/dL — ABNORMAL HIGH (ref 0.3–1.2)
Total Protein: 7 g/dL (ref 6.5–8.1)

## 2015-07-01 LAB — URINALYSIS COMPLETE WITH MICROSCOPIC (ARMC ONLY)
BACTERIA UA: NONE SEEN
Bilirubin Urine: NEGATIVE
Glucose, UA: >500 mg/dL — AB
LEUKOCYTES UA: NEGATIVE
Nitrite: NEGATIVE
Protein, ur: 100 mg/dL — AB
SPECIFIC GRAVITY, URINE: 1.02 (ref 1.005–1.030)
pH: 6 (ref 5.0–8.0)

## 2015-07-01 LAB — BASIC METABOLIC PANEL
Anion gap: UNDETERMINED (ref 5–15)
Anion gap: 17 — ABNORMAL HIGH (ref 5–15)
Anion gap: 12 (ref 5–15)
BUN: 15 mg/dL (ref 6–20)
BUN: 16 mg/dL (ref 6–20)
BUN: 15 mg/dL (ref 6–20)
CHLORIDE: 111 mmol/L (ref 101–111)
CHLORIDE: 113 mmol/L — AB (ref 101–111)
CHLORIDE: 117 mmol/L — AB (ref 101–111)
CO2: <7 mmol/L — ABNORMAL LOW (ref 22–32)
CO2: 8 mmol/L — AB (ref 22–32)
CO2: 11 mmol/L — AB (ref 22–32)
CREATININE: 0.92 mg/dL (ref 0.44–1.00)
Calcium: 8.8 mg/dL — ABNORMAL LOW (ref 8.9–10.3)
Calcium: 8.6 mg/dL — ABNORMAL LOW (ref 8.9–10.3)
Calcium: 8.4 mg/dL — ABNORMAL LOW (ref 8.9–10.3)
Creatinine, Ser: 1.1 mg/dL — ABNORMAL HIGH (ref 0.44–1.00)
Creatinine, Ser: 0.77 mg/dL (ref 0.44–1.00)
GFR calc non Af Amer: 51 mL/min — ABNORMAL LOW (ref >60)
GFR calc non Af Amer: >60 mL/min (ref >60)
GFR calc non Af Amer: >60 mL/min (ref >60)
GFR, EST AFRICAN AMERICAN: 59 mL/min — AB (ref >60)
GLUCOSE: 281 mg/dL — AB (ref 65–99)
Glucose, Bld: 423 mg/dL — ABNORMAL HIGH (ref 65–99)
Glucose, Bld: 149 mg/dL — ABNORMAL HIGH (ref 65–99)
POTASSIUM: 3.7 mmol/L (ref 3.5–5.1)
Potassium: 3.1 mmol/L — ABNORMAL LOW (ref 3.5–5.1)
Potassium: 2.6 mmol/L — CL (ref 3.5–5.1)
SODIUM: 136 mmol/L (ref 135–145)
Sodium: 138 mmol/L (ref 135–145)
Sodium: 140 mmol/L (ref 135–145)

## 2015-07-01 LAB — CBC
HCT: 38.7 % (ref 35.0–47.0)
HEMATOCRIT: 36.8 % (ref 35.0–47.0)
HEMOGLOBIN: 12.4 g/dL (ref 12.0–16.0)
Hemoglobin: 11.8 g/dL — ABNORMAL LOW (ref 12.0–16.0)
MCH: 29 pg (ref 26.0–34.0)
MCH: 29.5 pg (ref 26.0–34.0)
MCHC: 32 g/dL (ref 32.0–36.0)
MCHC: 32.2 g/dL (ref 32.0–36.0)
MCV: 90.4 fL (ref 80.0–100.0)
MCV: 91.7 fL (ref 80.0–100.0)
Platelets: 422 10*3/uL (ref 150–440)
Platelets: 382 10*3/uL (ref 150–440)
RBC: 4.28 MIL/uL (ref 3.80–5.20)
RBC: 4.01 MIL/uL (ref 3.80–5.20)
RDW: 15.5 % — ABNORMAL HIGH (ref 11.5–14.5)
RDW: 14.9 % — ABNORMAL HIGH (ref 11.5–14.5)
WBC: 15.6 10*3/uL — AB (ref 3.6–11.0)
WBC: 16.8 10*3/uL — AB (ref 3.6–11.0)

## 2015-07-01 LAB — MAGNESIUM: Magnesium: 1.4 mg/dL — ABNORMAL LOW (ref 1.7–2.4)

## 2015-07-01 LAB — MRSA PCR SCREENING: MRSA by PCR: NEGATIVE

## 2015-07-01 LAB — BLOOD GAS, ARTERIAL
ACID-BASE DEFICIT: 12.5 mmol/L — AB (ref 0.0–2.0)
Bicarbonate: 12.3 mEq/L — ABNORMAL LOW (ref 21.0–28.0)
FIO2: 0.21
O2 SAT: 96.4 %
PATIENT TEMPERATURE: 37
pCO2 arterial: 25 mmHg — ABNORMAL LOW (ref 32.0–48.0)
pH, Arterial: 7.3 — ABNORMAL LOW (ref 7.350–7.450)
pO2, Arterial: 94 mmHg (ref 83.0–108.0)

## 2015-07-01 LAB — GLUCOSE, CAPILLARY
GLUCOSE-CAPILLARY: 442 mg/dL — AB (ref 65–99)
GLUCOSE-CAPILLARY: 364 mg/dL — AB (ref 65–99)
GLUCOSE-CAPILLARY: 312 mg/dL — AB (ref 65–99)
GLUCOSE-CAPILLARY: 305 mg/dL — AB (ref 65–99)
GLUCOSE-CAPILLARY: 232 mg/dL — AB (ref 65–99)
GLUCOSE-CAPILLARY: 188 mg/dL — AB (ref 65–99)
GLUCOSE-CAPILLARY: 131 mg/dL — AB (ref 65–99)
GLUCOSE-CAPILLARY: 134 mg/dL — AB (ref 65–99)
GLUCOSE-CAPILLARY: 133 mg/dL — AB (ref 65–99)
Glucose-Capillary: 386 mg/dL — ABNORMAL HIGH (ref 65–99)
Glucose-Capillary: 397 mg/dL — ABNORMAL HIGH (ref 65–99)
Glucose-Capillary: 179 mg/dL — ABNORMAL HIGH (ref 65–99)
Glucose-Capillary: 148 mg/dL — ABNORMAL HIGH (ref 65–99)
Glucose-Capillary: 128 mg/dL — ABNORMAL HIGH (ref 65–99)

## 2015-07-01 LAB — TROPONIN I

## 2015-07-01 LAB — TYPE AND SCREEN
ABO/RH(D): A POS
ANTIBODY SCREEN: NEGATIVE

## 2015-07-01 LAB — LIPASE, BLOOD: Lipase: 42 U/L (ref 11–51)

## 2015-07-01 LAB — LACTIC ACID, PLASMA: Lactic Acid, Venous: 1.5 mmol/L (ref 0.5–2.0)

## 2015-07-01 LAB — PHOSPHORUS: Phosphorus: 3.1 mg/dL (ref 2.5–4.6)

## 2015-07-01 MED ORDER — POTASSIUM CHLORIDE 10 MEQ/100ML IV SOLN
10.0000 meq | INTRAVENOUS | Status: AC
  Administered 2015-07-01 – 2015-07-02 (×6): 10 meq via INTRAVENOUS
  Filled 2015-07-01 (×6): qty 100

## 2015-07-01 MED ORDER — HYDRALAZINE HCL 20 MG/ML IJ SOLN
10.0000 mg | INTRAMUSCULAR | Status: DC | PRN
  Administered 2015-07-02 (×2): 10 mg via INTRAVENOUS
  Filled 2015-07-01 (×2): qty 1

## 2015-07-01 MED ORDER — SODIUM BICARBONATE 8.4 % IV SOLN
150.0000 meq | Freq: Once | INTRAVENOUS | Status: AC
  Administered 2015-07-02: 150 meq via INTRAVENOUS
  Filled 2015-07-01: qty 150

## 2015-07-01 MED ORDER — INSULIN REGULAR BOLUS VIA INFUSION
0.0000 [IU] | Freq: Three times a day (TID) | INTRAVENOUS | Status: DC
Start: 2015-07-01 — End: 2015-07-02
  Filled 2015-07-01: qty 10

## 2015-07-01 MED ORDER — FAMOTIDINE IN NACL 20-0.9 MG/50ML-% IV SOLN
20.0000 mg | Freq: Two times a day (BID) | INTRAVENOUS | Status: DC
  Administered 2015-07-01 – 2015-07-02 (×4): 20 mg via INTRAVENOUS
  Filled 2015-07-01 (×6): qty 50

## 2015-07-01 MED ORDER — DEXTROSE 50 % IV SOLN: 25.0000 mL | INTRAVENOUS | Status: DC | PRN

## 2015-07-01 MED ORDER — SODIUM CHLORIDE 0.9 % IV SOLN: 250.0000 mL | INTRAVENOUS | Status: DC | PRN

## 2015-07-01 MED ORDER — DEXTROSE-NACL 5-0.45 % IV SOLN
INTRAVENOUS | Status: DC
  Administered 2015-07-01: 16:00:00 via INTRAVENOUS

## 2015-07-01 MED ORDER — SODIUM CHLORIDE 0.9 % IV BOLUS (SEPSIS)
1000.0000 mL | Freq: Once | INTRAVENOUS | Status: AC
  Administered 2015-07-01: 1000 mL via INTRAVENOUS

## 2015-07-01 MED ORDER — SODIUM CHLORIDE 0.9 % IV SOLN: INTRAVENOUS | Status: DC

## 2015-07-01 MED ORDER — ONDANSETRON HCL 4 MG/2ML IJ SOLN
4.0000 mg | Freq: Four times a day (QID) | INTRAMUSCULAR | Status: DC | PRN
  Administered 2015-07-03: 4 mg via INTRAVENOUS
  Filled 2015-07-01 (×2): qty 2

## 2015-07-01 MED ORDER — SODIUM CHLORIDE 0.9 % IV SOLN
INTRAVENOUS | Status: DC
  Administered 2015-07-01: 3.4 [IU]/h via INTRAVENOUS
  Filled 2015-07-01: qty 2.5

## 2015-07-01 MED ORDER — ACETAMINOPHEN 325 MG PO TABS: 650.0000 mg | ORAL_TABLET | ORAL | Status: DC | PRN

## 2015-07-01 MED ORDER — INSULIN ASPART 100 UNIT/ML ~~LOC~~ SOLN
8.0000 [IU] | Freq: Once | SUBCUTANEOUS | Status: AC
Start: 2015-07-01 — End: 2015-07-01
  Administered 2015-07-01: 8 [IU] via INTRAVENOUS
  Filled 2015-07-01: qty 8

## 2015-07-01 MED ORDER — AMLODIPINE BESYLATE 5 MG PO TABS
5.0000 mg | ORAL_TABLET | Freq: Every day | ORAL | Status: DC
  Administered 2015-07-01 – 2015-07-07 (×6): 5 mg via ORAL
  Filled 2015-07-01 (×7): qty 1

## 2015-07-01 MED ORDER — CETYLPYRIDINIUM CHLORIDE 0.05 % MT LIQD
7.0000 mL | Freq: Two times a day (BID) | OROMUCOSAL | Status: DC
  Administered 2015-07-01 – 2015-07-02 (×2): 7 mL via OROMUCOSAL

## 2015-07-01 MED ORDER — SODIUM CHLORIDE 0.9 % IV SOLN
INTRAVENOUS | Status: DC
  Administered 2015-07-01: 12:00:00 via INTRAVENOUS

## 2015-07-01 MED ORDER — PNEUMOCOCCAL VAC POLYVALENT 25 MCG/0.5ML IJ INJ: 0.5000 mL | INJECTION | INTRAMUSCULAR | Status: DC

## 2015-07-01 MED ORDER — ENOXAPARIN SODIUM 40 MG/0.4ML ~~LOC~~ SOLN
40.0000 mg | SUBCUTANEOUS | Status: DC
  Administered 2015-07-01 – 2015-07-03 (×3): 40 mg via SUBCUTANEOUS
  Filled 2015-07-01 (×3): qty 0.4

## 2015-07-01 MED ORDER — ONDANSETRON HCL 4 MG/2ML IJ SOLN
4.0000 mg | Freq: Once | INTRAMUSCULAR | Status: AC
  Administered 2015-07-01: 4 mg via INTRAVENOUS
  Filled 2015-07-01: qty 2

## 2015-07-01 NOTE — Progress Notes (Signed)
eLink Physician-Brief Progress Note Patient Name: Sylvia Black DOB: 04/07/1945 MRN: [id number]   Date of Service  07/01/2015  HPI/Events of Note  Hypertension - BP = 169/59.  eICU Interventions  Will order: 1. Amlodipine 5 mg PO now and Q day. (Reduced dose) 2. Hydralazine 10 mg IV Q 4 hours PRN SBP > 160 or DBP > 100.     Intervention Category Intermediate Interventions: Hypertension - evaluation and management  Sommer,Steven Eugene 07/01/2015, 9:11 PM

## 2015-07-01 NOTE — ED Notes (Signed)
Pt asking for water. Okay to give water per Dr. Belia HemanKasa.

## 2015-07-01 NOTE — ED Notes (Signed)
Pt came to ED via EMS from home. Pt has not felt well for the past week. C/o nausea, vomiting. Pt reports vomiting up some blood. Blood sugar in 300s w/ EMS.

## 2015-07-01 NOTE — ED Provider Notes (Signed)
[facility] Emergency Department Provider Note  Time seen: 8:24 AM  I have reviewed the triage vital signs and the nursing notes.   HISTORY  Chief Complaint Emesis and Hyperglycemia    HPI Sylvia Black is a 70 y.o. female with a past medical history of hypertension, frequent urinary tract infections, hyperlipidemia, diabetes, gastric reflux, CVA, bipolar, presents the emergency department with generalized weakness, nausea and vomiting.According to EMS report the patient lives with her daughter, the daughter has noted for the past one week the patient has been increasingly weak, not feeling well with nausea and vomiting. Some the vomiting has appeared dark. EMS notes an elevated glucose of 300. At this time the patient cannot give a clear history. She appears confused, we do not know what her baseline mental status is currently.     Past Medical History  Diagnosis Date  . Hypertension   . Frequent UTI   . PONV (postoperative nausea and vomiting)     "makes me violently ill" (02/22/2013)  . Family history of anesthesia complication     "daughter's w/PONV" (02/22/2013)  . High cholesterol   . Type II diabetes mellitus (HCC)   . Pneumonia 1990's    "once"  . Sleep apnea     "dx'd in the 1990's; don't wear mask" (02/22/2013)  . GERD (gastroesophageal reflux disease)   . Stroke [facility](HCC) 2010; 02/2012    denies residual; "balance issues since & weak on left side since" (02/22/2013)  . 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 Gout attack     "twice" (02/22/2013)  . Anxiety   . Depression   . Bipolar disorder (HCC)   . Vertigo     Patient Active Problem List   Diagnosis Date Noted  . Stroke (HCC)   . Hypertension   . Frequent UTI   . Diabetes mellitus without complication (HCC)   . Hypoglycemia 02/21/2013    Past Surgical History  Procedure Laterality Date  . Posterior fusion cervical spine  ~ 1988  . Anterior cervical  decomp/discectomy fusion  1989  . Cholecystectomy  1984  . Appendectomy  1984  . Tubal ligation  1973  . Wrist surgery Right ~ 1985    "tied off damagaed nerve" (02/22/2013)  . Cataract extraction w/ intraocular lens  implant, bilateral Bilateral ~ 2008    Current Outpatient Rx  Name  Route  Sig  Dispense  Refill  . amLODipine (NORVASC) 10 MG tablet   Oral   Take 10 mg by mouth daily.         Marland Kitchen. aspirin EC 81 MG tablet   Oral   Take 81 mg by mouth daily.         Marland Kitchen. buPROPion (WELLBUTRIN XL) 150 MG 24 hr tablet   Oral   Take 150 mg by mouth 3 (three) times daily as needed (pain).         . ciprofloxacin (CIPRO) 500 MG tablet   Oral   Take 1 tablet (500 mg total) by mouth 2 (two) times daily.   10 tablet   0   . clonazePAM (KLONOPIN) 1 MG tablet   Oral   Take 1 mg by mouth at bedtime.         . clopidogrel (PLAVIX) 75 MG tablet   Oral   Take 75 mg by mouth daily with breakfast.         . Cranberry-Vitamin C-Probiotic (AZO  CRANBERRY PO)   Oral   Take 1 capsule by mouth daily as needed (kidney infections).         . diclofenac sodium (VOLTAREN) 1 % GEL   Topical   Apply 4 g topically daily as needed (pain).         . insulin NPH-regular Human (NOVOLIN 70/30) (70-30) 100 UNIT/ML injection   Subcutaneous   Inject 20-50 Units into the skin 2 (two) times daily with a meal. Take 50 units after breakfast and 20 units after dinner   10 mL   11   . lidocaine (LIDODERM) 5 %   Transdermal   Place 1-3 patches onto the skin daily. Remove & Discard patch within 12 hours or as directed by MD         . lisinopril (PRINIVIL,ZESTRIL) 40 MG tablet   Oral   Take 40 mg by mouth daily after breakfast.         . metFORMIN (GLUCOPHAGE-XR) 500 MG 24 hr tablet   Oral   Take 500 mg by mouth daily with breakfast.         . metoprolol succinate (TOPROL-XL) 100 MG 24 hr tablet   Oral   Take 100 mg by mouth daily. Take with or immediately following a meal.         .  Multiple Vitamin (MULTIVITAMIN WITH MINERALS) TABS tablet   Oral   Take 1 tablet by mouth daily.         . pravastatin (PRAVACHOL) 20 MG tablet   Oral   Take 20 mg by mouth daily.         Marland Kitchen tiZANidine (ZANAFLEX) 4 MG tablet   Oral   Take 4-8 mg by mouth 2 (two) times daily. Take 1 tablet ( ) in the morning and 2 tablets ( ) at night           Allergies Penicillins; Sulfa antibiotics; and Tetanus toxoids  No family history on file.  Social History Social History  Substance Use Topics  . Smoking status: Passive Smoke Exposure - Never Smoker  . Smokeless tobacco: Never Used  . Alcohol Use: Yes     Comment: 02/22/2013 "mixed drink on special occasions; glass of wine q 2-3 months"    Review of Systems Unable to give an adequate review of systems due to confusion. ____________________________________________   PHYSICAL EXAM:  Constitutional: Alert. No acute distress. Unable to answer questions, or follow basic commands. Eyes: Normal exam ENT   Head: Normocephalic and atraumatic.   Mouth/Throat: Mucous membranes are moist. Cardiovascular: Normal rate, regular rhythm. No murmur Respiratory: Normal respiratory effort without tachypnea nor retractions. Breath sounds are clear Gastrointestinal: Soft and nontender. No distention.  Obese. Patient does have mild skin breakdown adjacent to her anus on her buttocks. No sacral ulcer. Musculoskeletal: Nontender with normal range of motion in all extremities. Neurologic:  Patient will answer yes no questions, but are largely inaccurate answers. Will not follow commands making her neurologic exam very difficult. History of CVA but is not known at this time what her deficits are, and which are new or old. Skin:  Skin is warm, dry   ____________________________________________    EKG  EKG reviewed and interpreted by myself shows sinus tachycardia 106 bpm, narrow QRS, normal axis, largely normal intervals, nonspecific ST  changes no obvious ST elevation.  ____________________________________________    RADIOLOGY  CT head negative. X-ray negative.  ____________________________________________    INITIAL IMPRESSION / ASSESSMENT AND PLAN / ED COURSE  Pertinent labs & imaging results that were available during my care of the patient were reviewed by me and considered in my medical decision making (see chart for details).  Patient presents the emergency department with generalized weakness for the past one week, nausea, vomiting. Patient is confused in the emergency department, at this time we're waiting the daughter's arrival. It is not clear what the patient's baseline mental status is. We will check labs. Rectal exam shows brown stool slightly guaiac positive. Patient does not appear to have any abdominal tenderness with palpation. Patient is awake and alert, but does appear confused when asked her weight she repeatedly says 5 foot 3. Patient cannot contribute to her history. Awaiting family arrival for more information.  Patient's daughter is now here with the patient. She states for the past 5-7 days the patient has been very weak and confused. Has been intermittently nauseated and vomiting. Today the patient was very confused, was not able to give her her name or any other information so the patient called EMS to bring her to the hospital for evaluation. She also noted dark vomit this morning. Patient's labs have resulted showing a very low bicarbonate level. Elevated potassium. Elevated blood glucose. Concerning for possible DKA along with 2+ ketones and a urine. We'll obtain a VBG to help further evaluate. We will continue IV hydration and dose IV insulin. Given the patient's altered mental status I have also ordered a CT scan of her head as well as a chest x-ray to rule out aspiration.  Patient is VBG is 7.04 PA. Consistent with diabetic ketoacidosis. Insulin infusion has been ordered. A third liter of  fluid has been ordered. Patient is doing better, sitting up in bed and is able to answer more questions, but the daughter states she still appears confused, but is less somnolent at this time.  CT scan of head shows no acute change. Chest x-ray shows no acute abnormality.  CRITICAL CARE Performed by: Minna Antis   Total critical care time: 60 minutes  Critical care time was exclusive of separately billable procedures and treating other patients.  Critical care was necessary to treat or prevent imminent or life-threatening deterioration.  Critical care was time spent personally by me on the following activities: development of treatment plan with patient and/or surrogate as well as nursing, discussions with consultants, evaluation of patient's response to treatment, examination of patient, obtaining history from patient or surrogate, ordering and performing treatments and interventions, ordering and review of laboratory studies, ordering and review of radiographic studies, pulse oximetry and re-evaluation of patient's condition.   ____________________________________________   FINAL CLINICAL IMPRESSION(S) / ED DIAGNOSES  Generalized weakness Nausea vomiting Diabetic ketoacidosis  Minna Antis, MD 07/01/15 1049

## 2015-07-01 NOTE — H&P (Signed)
[facility] Pulmonary Medicine Consultation      Name: Sylvia Black MRN: [id number] DOB: 01-16-1946    ADMISSION DATE:  07/01/2015    CHIEF COMPLAINT:   Acute mental status changes   HISTORY OF PRESENT ILLNESS  70 yo obese white female with h/o CVA seen today and being admitted to ICU for severe acidosis Patient lethargic but arousable, noted to be very confused and had labored breathing per daughter daughter in room and answering all questions She has confirmed that patient is DNR/DNI  Patient with low blood pressure, very high FSBS with ph 7.0 Patient with increased WOB and SOB  ROS per daughter, state sthat  Patient has h/o cellulitis but states that her legs/feet look really good compared from before      PAST MEDICAL HISTORY    :  Past Medical History  Diagnosis Date  . Hypertension   . Frequent UTI   . PONV (postoperative nausea and vomiting)     "makes me violently ill" (02/22/2013)  . Family history of anesthesia complication     "daughter's w/PONV" (02/22/2013)  . High cholesterol   . Type II diabetes mellitus (HCC)   . Pneumonia 1990's    "once"  . Sleep apnea     "dx'd in the 1990's; don't wear mask" (02/22/2013)  . GERD (gastroesophageal reflux disease)   . Stroke [facility]) 2010; 02/2012    denies residual; "balance issues since & weak on left side since" (02/22/2013)  . 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 Gout attack     "twice" (02/22/2013)  . Anxiety   . Depression   . Bipolar disorder (HCC)   . Vertigo    Past Surgical History  Procedure Laterality Date  . Posterior fusion cervical spine  ~ 1988  . Anterior cervical decomp/discectomy fusion  1989  . Cholecystectomy  1984  . Appendectomy  1984  . Tubal ligation  1973  . Wrist surgery Right ~ 1985    "tied off damagaed nerve" (02/22/2013)  . Cataract extraction w/ intraocular lens  implant, bilateral Bilateral ~ 2008   Prior to Admission medications     Medication Sig Start Date End Date Taking? Authorizing Provider  amLODipine (NORVASC) 10 MG tablet Take 10 mg by mouth daily.   Yes Historical Provider, MD  buPROPion (WELLBUTRIN XL) 150 MG 24 hr tablet Take 150 mg by mouth 3 (three) times daily as needed (pain).   Yes Historical Provider, MD  clonazePAM (KLONOPIN) 1 MG tablet Take 1 mg by mouth at bedtime.   Yes Historical Provider, MD  clopidogrel (PLAVIX) 75 MG tablet Take 75 mg by mouth daily with breakfast.   Yes Historical Provider, MD  Cranberry-Vitamin C-Probiotic (AZO CRANBERRY PO) Take 1 capsule by mouth daily as needed (kidney infections).   Yes Historical Provider, MD  diclofenac sodium (VOLTAREN) 1 % GEL Apply 4 g topically daily as needed (pain).   Yes Historical Provider, MD  LANTUS SOLOSTAR 100 UNIT/ML Solostar Pen Inject 70 Units into the skin every morning. 05/22/15  Yes Historical Provider, MD  lidocaine (LIDODERM) 5 % Place 1-3 patches onto the skin daily as needed. For pain. Remove & Discard patch within 12 hours or as directed by MD   Yes Historical Provider, MD  lisinopril (PRINIVIL,ZESTRIL) 40 MG tablet Take 40 mg by mouth daily after breakfast.   Yes Historical Provider, MD  meclizine (ANTIVERT) 25 MG tablet Take 25 mg by mouth  daily.    Yes Historical Provider, MD  metFORMIN (GLUCOPHAGE-XR) 500 MG 24 hr tablet Take 1,000 mg by mouth daily with breakfast.    Yes Historical Provider, MD  metoprolol succinate (TOPROL-XL) 100 MG 24 hr tablet Take 100 mg by mouth daily. Take with or immediately following a meal.   Yes Historical Provider, MD  pravastatin (PRAVACHOL) 20 MG tablet Take 20 mg by mouth every morning.    Yes Historical Provider, MD  predniSONE (DELTASONE) 10 MG tablet Take 20 mg by mouth every morning. 05/10/15  Yes Historical Provider, MD  tiZANidine (ZANAFLEX) 4 MG tablet Take 4-8 mg by mouth 2 (two) times daily. Take 1 tablet (4mg ) in the morning and 2 tablets (8mg ) at night   Yes Historical Provider, MD   ciprofloxacin (CIPRO) 500 MG tablet Take 1 tablet (500 mg total) by mouth 2 (two) times daily. 02/23/13   Clanford Cyndie Mull, MD  insulin NPH-regular Human (NOVOLIN 70/30) (70-30) 100 UNIT/ML injection Inject 20-50 Units into the skin 2 (two) times daily with a meal. Take 50 units after breakfast and 20 units after dinner 02/23/13   Clanford Cyndie Mull, MD   Allergies  Allergen Reactions  . Ciprofloxacin     Other reaction(s): Hallucinations  . Penicillins Hives    Has patient had a PCN reaction causing immediate rash, facial/tongue/throat swelling, SOB or lightheadedness with hypotension: Yes Has patient had a PCN reaction causing severe rash involving mucus membranes or skin necrosis: No Has patient had a PCN reaction that required hospitalization Yes Has patient had a PCN reaction occurring within the last 10 years: No If all of the above answers are "NO", then may proceed with Cephalosporin use.  . Sulfa Antibiotics     "hives"  . Tetanus Toxoids     "I had a strong reaction".     FAMILY HISTORY   No family history on file.    SOCIAL HISTORY    reports that she has been passively smoking.  She has never used smokeless tobacco. She reports that she drinks alcohol. She reports that she does not use illicit drugs.  Review of Systems  Unable to perform ROS: critical illness      VITAL SIGNS    Temp:  [97.9 F (36.6 C)] 97.9 F (36.6 C) (06/10 0829) Pulse Rate:  [102-106] 102 (06/10 1049) Resp:  [21-23] 22 (06/10 1049) BP: (148-174)/(59-62) 148/62 mmHg (06/10 1049) SpO2:  [100 %] 100 % (06/10 1049) Weight:  [250 lb (113.399 kg)] 250 lb (113.399 kg) (06/10 0858) HEMODYNAMICS:   VENTILATOR SETTINGS:   INTAKE / OUTPUT:  Intake/Output Summary (Last 24 hours) at 07/01/15 1051 Last data filed at 07/01/15 0850  Gross per 24 hour  Intake      0 ml  Output    250 ml  Net   -250 ml       PHYSICAL EXAM   Physical Exam  Constitutional: She appears distressed.   HENT:  Head: Normocephalic and atraumatic.  Eyes: Pupils are equal, round, and reactive to light. No scleral icterus.  Neck: Normal range of motion. Neck supple.  Cardiovascular: Normal rate, regular rhythm and normal heart sounds.   No murmur heard. Pulmonary/Chest: No stridor. She is in respiratory distress. She has no wheezes. She has rales.  resp distress  Abdominal: Soft. She exhibits no distension. There is no tenderness.  Musculoskeletal: She exhibits edema and tenderness.  B/l lower ext erythema  Neurological: She is alert.  Skin: Skin is warm. No  rash noted. She is diaphoretic.       LABS   LABS:  CBC  Recent Labs Lab 07/01/15 0830  WBC 15.6*  HGB 12.4  HCT 38.7  PLT 422   Coag's No results for input(s): APTT, INR in the last 168 hours. BMET  Recent Labs Lab 07/01/15 0830  NA 134*  K 5.2*  CL 106  CO2 <7*  BUN 13  CREATININE 0.96  GLUCOSE 488*   Electrolytes  Recent Labs Lab 07/01/15 0830  CALCIUM 8.9   Sepsis Markers No results for input(s): LATICACIDVEN, PROCALCITON, O2SATVEN in the last 168 hours. ABG No results for input(s): PHART, PCO2ART, PO2ART in the last 168 hours. Liver Enzymes  Recent Labs Lab 07/01/15 0830  AST 16  ALT 20  ALKPHOS 93  BILITOT 1.6*  ALBUMIN 3.2*   Cardiac Enzymes  Recent Labs Lab 07/01/15 0830  TROPONINI <0.03   Glucose  Recent Labs Lab 07/01/15 0826  GLUCAP 442*     No results found for this or any previous visit (from the past 240 hour(s)).   Current facility-administered medications:  .  0.9 %  sodium chloride infusion, 250 mL, Intravenous, PRN, Erin FullingKurian Tzippy Testerman, MD .  0.9 %  sodium chloride infusion, , Intravenous, Continuous, Erin FullingKurian Kingsten Enfield, MD .  0.9 %  sodium chloride infusion, , Intravenous, Continuous, Erin FullingKurian Raseel Jans, MD .  acetaminophen (TYLENOL) tablet 650 mg, 650 mg, Oral, Q4H PRN, Erin FullingKurian Kristi Hyer, MD .  dextrose 5 %-0.45 % sodium chloride infusion, , Intravenous, Continuous, Erin FullingKurian Suriya Kovarik,  MD .  dextrose 50 % solution 25 mL, 25 mL, Intravenous, PRN, Erin FullingKurian Georg Ang, MD .  enoxaparin (LOVENOX) injection 40 mg, 40 mg, Subcutaneous, Q24H, Erin FullingKurian Trenden Hazelrigg, MD .  famotidine (PEPCID) IVPB 20 mg premix, 20 mg, Intravenous, Q12H, Erin FullingKurian Nikeshia Keetch, MD .  insulin regular (NOVOLIN R,HUMULIN R) 250 Units in sodium chloride 0.9 % 250 mL (1 Units/mL) infusion, , Intravenous, Continuous, Minna AntisKevin Paduchowski, MD, Last Rate: 3.4 mL/hr at 07/01/15 1047, 3.4 Units/hr at 07/01/15 1047 .  insulin regular bolus via infusion 0-10 Units, 0-10 Units, Intravenous, TID WC, Erin FullingKurian Stefhanie Kachmar, MD .  ondansetron (ZOFRAN) injection 4 mg, 4 mg, Intravenous, Q6H PRN, Erin FullingKurian Remas Sobel, MD  Current outpatient prescriptions:  .  amLODipine (NORVASC) 10 MG tablet, Take 10 mg by mouth daily., Disp: , Rfl:  .  buPROPion (WELLBUTRIN XL) 150 MG 24 hr tablet, Take 150 mg by mouth 3 (three) times daily as needed (pain)., Disp: , Rfl:  .  clonazePAM (KLONOPIN) 1 MG tablet, Take 1 mg by mouth at bedtime., Disp: , Rfl:  .  clopidogrel (PLAVIX) 75 MG tablet, Take 75 mg by mouth daily with breakfast., Disp: , Rfl:  .  Cranberry-Vitamin C-Probiotic (AZO CRANBERRY PO), Take 1 capsule by mouth daily as needed (kidney infections)., Disp: , Rfl:  .  diclofenac sodium (VOLTAREN) 1 % GEL, Apply 4 g topically daily as needed (pain)., Disp: , Rfl:  .  LANTUS SOLOSTAR 100 UNIT/ML Solostar Pen, Inject 70 Units into the skin every morning., Disp: , Rfl:  .  lidocaine (LIDODERM) 5 %, Place 1-3 patches onto the skin daily as needed. For pain. Remove & Discard patch within 12 hours or as directed by MD, Disp: , Rfl:  .  lisinopril (PRINIVIL,ZESTRIL) 40 MG tablet, Take 40 mg by mouth daily after breakfast., Disp: , Rfl:  .  meclizine (ANTIVERT) 25 MG tablet, Take 25 mg by mouth daily. , Disp: , Rfl:  .  metFORMIN (GLUCOPHAGE-XR) 500 MG 24  hr tablet, Take 1,000 mg by mouth daily with breakfast. , Disp: , Rfl:  .  metoprolol succinate (TOPROL-XL) 100 MG 24 hr tablet, Take  100 mg by mouth daily. Take with or immediately following a meal., Disp: , Rfl:  .  pravastatin (PRAVACHOL) 20 MG tablet, Take 20 mg by mouth every morning. , Disp: , Rfl:  .  predniSONE (DELTASONE) 10 MG tablet, Take 20 mg by mouth every morning., Disp: , Rfl: 0 .  tiZANidine (ZANAFLEX) 4 MG tablet, Take 4-8 mg by mouth 2 (two) times daily. Take 1 tablet (4mg ) in the morning and 2 tablets (8mg ) at night, Disp: , Rfl:  .  ciprofloxacin (CIPRO) 500 MG tablet, Take 1 tablet (500 mg total) by mouth 2 (two) times daily., Disp: 10 tablet, Rfl: 0 .  insulin NPH-regular Human (NOVOLIN 70/30) (70-30) 100 UNIT/ML injection, Inject 20-50 Units into the skin 2 (two) times daily with a meal. Take 50 units after breakfast and 20 units after dinner, Disp: 10 mL, Rfl: 11  IMAGING    Ct Head Wo Contrast  07/01/2015  CLINICAL DATA:  Pt here with AMS. Pt not very good historian, per note in pt chart pt not feeling well over the last week. Pt with documented hx of stroke. EXAM: CT HEAD WITHOUT CONTRAST TECHNIQUE: Contiguous axial images were obtained from the base of the skull through the vertex without intravenous contrast. COMPARISON:  03/01/2012 FINDINGS: Brain: Diffuse parenchymal atrophy. Patchy areas of hypoattenuation in deep and periventricular white matter bilaterally. Negative for acute intracranial hemorrhage, mass lesion, acute infarction, midline shift, or mass-effect. Acute infarct may be inapparent on noncontrast CT. Ventricles and sulci symmetric. Vascular: No hyperdense vessel or unexpected calcification. Atherosclerotic and physiologic intracranial calcifications. Skull: Negative for fracture or focal lesion. Sinuses/Orbits: No acute findings. Other: None. IMPRESSION: 1. Negative for bleed or other acute intracranial process. 2. Atrophy and nonspecific white matter changes. Electronically Signed   By: Corlis Leak M.D.   On: 07/01/2015 10:36   Dg Chest Portable 1 View  07/01/2015  CLINICAL DATA:  AMS. Pt  came to ED via EMS from home. Pt has not felt well for the past week. C/o nausea, vomiting. Pt reports vomiting up some blood. Hx/o HTN, stroke, and pneumonia. Nonsmoker. Best images possible. EXAM: PORTABLE CHEST - 1 VIEW COMPARISON:  02/22/2013 FINDINGS: Lungs are clear. Heart size and mediastinal contours are within normal limits. Atheromatous tortuous thoracic aorta. No effusion. Cervical fixation hardware noted. IMPRESSION: No acute cardiopulmonary disease. Electronically Signed   By: Corlis Leak M.D.   On: 07/01/2015 10:34      Indwelling Urinary Catheter continued, requirement due to   Reason to continue Indwelling Urinary Catheter for strict Intake/Output monitoring for hemodynamic instability    MICRO DATA: MRSA PCR pending Urine  Blood Resp   ANTIMICROBIALS:     ASSESSMENT/PLAN  70 yo obese white female with multiple medical issues with acute and severe metabolic acidosis with DKA asscoaited with SOB and labored breathing   PULMONARY -oxygen as needed, patient is DNR/DNI -follow up ABG -will consider bicarb infusion if acidosis persistent  CARDIOVASCULAR Cont IVF's @15  per hour NS boluses -consider CVL   RENAL Watch UO, place foley catheter  GASTROINTESTINAL Ice ships and water for now  HEMATOLOGIC Follow CBC  INFECTIOUS No obvious source of infection at this time -will re-evaluate need for ABX, follow fevers and WBC counts in next 24 hrs  ENDOCRINE DKA glucose stabilizer  NEUROLOGIC encephalopathy from acidosis-should improve with  time and fluid replacement   I have personally obtained a history, examined the patient, evaluated laboratory and independently reviewed  imaging results, formulated the assessment and plan and placed orders.  The Patient requires high complexity decision making for assessment and support, frequent evaluation and titration of therapies, application of advanced monitoring technologies and extensive interpretation of multiple  databases. Critical Care Time devoted to patient care services described in this note is 60 minutes.   Overall, patient is critically ill, prognosis is guarded. Patient at high risk for cardiac arrest and death.    Lucie Leather, M.D.  [facility]  Medical Director [facility] Medical Director [facility] Cardio-Pulmonary Department

## 2015-07-01 NOTE — Progress Notes (Signed)
eLink Physician-Brief Progress Note Patient Name: Sylvia Black DOB: 09/13/1945 MRN: [id number]   Date of Service  07/01/2015  HPI/Events of Note  K+ = 2.6 and Creatinine = 0.77.  eICU Interventions  Will replete K+.     Intervention Category Intermediate Interventions: Electrolyte abnormality - evaluation and management  Sommer,Steven Eugene 07/01/2015, 7:01 PM

## 2015-07-02 ENCOUNTER — Inpatient Hospital Stay: Payer: Medicare HMO

## 2015-07-02 DIAGNOSIS — R4182 Altered mental status, unspecified: Secondary | ICD-10-CM | POA: Insufficient documentation

## 2015-07-02 DIAGNOSIS — Z452 Encounter for adjustment and management of vascular access device: Secondary | ICD-10-CM | POA: Insufficient documentation

## 2015-07-02 DIAGNOSIS — E1310 Other specified diabetes mellitus with ketoacidosis without coma: Principal | ICD-10-CM

## 2015-07-02 DIAGNOSIS — E1110 Type 2 diabetes mellitus with ketoacidosis without coma: Secondary | ICD-10-CM | POA: Insufficient documentation

## 2015-07-02 LAB — GLUCOSE, CAPILLARY
GLUCOSE-CAPILLARY: 185 mg/dL — AB (ref 65–99)
GLUCOSE-CAPILLARY: 184 mg/dL — AB (ref 65–99)
GLUCOSE-CAPILLARY: 152 mg/dL — AB (ref 65–99)
GLUCOSE-CAPILLARY: 153 mg/dL — AB (ref 65–99)
GLUCOSE-CAPILLARY: 142 mg/dL — AB (ref 65–99)
GLUCOSE-CAPILLARY: 146 mg/dL — AB (ref 65–99)
GLUCOSE-CAPILLARY: 174 mg/dL — AB (ref 65–99)
GLUCOSE-CAPILLARY: 146 mg/dL — AB (ref 65–99)
Glucose-Capillary: 142 mg/dL — ABNORMAL HIGH (ref 65–99)
Glucose-Capillary: 143 mg/dL — ABNORMAL HIGH (ref 65–99)
Glucose-Capillary: 152 mg/dL — ABNORMAL HIGH (ref 65–99)
Glucose-Capillary: 153 mg/dL — ABNORMAL HIGH (ref 65–99)
Glucose-Capillary: 169 mg/dL — ABNORMAL HIGH (ref 65–99)
Glucose-Capillary: 169 mg/dL — ABNORMAL HIGH (ref 65–99)
Glucose-Capillary: 169 mg/dL — ABNORMAL HIGH (ref 65–99)
Glucose-Capillary: 171 mg/dL — ABNORMAL HIGH (ref 65–99)
Glucose-Capillary: 171 mg/dL — ABNORMAL HIGH (ref 65–99)
Glucose-Capillary: 181 mg/dL — ABNORMAL HIGH (ref 65–99)

## 2015-07-02 LAB — BASIC METABOLIC PANEL
ANION GAP: 9 (ref 5–15)
ANION GAP: 8 (ref 5–15)
Anion gap: 7 (ref 5–15)
BUN: 12 mg/dL (ref 6–20)
BUN: 10 mg/dL (ref 6–20)
BUN: 10 mg/dL (ref 6–20)
CALCIUM: 8.1 mg/dL — AB (ref 8.9–10.3)
CHLORIDE: 114 mmol/L — AB (ref 101–111)
CHLORIDE: 108 mmol/L (ref 101–111)
CO2: 20 mmol/L — AB (ref 22–32)
CO2: 20 mmol/L — ABNORMAL LOW (ref 22–32)
CO2: 21 mmol/L — ABNORMAL LOW (ref 22–32)
CREATININE: 0.63 mg/dL (ref 0.44–1.00)
Calcium: 8.2 mg/dL — ABNORMAL LOW (ref 8.9–10.3)
Calcium: 7.9 mg/dL — ABNORMAL LOW (ref 8.9–10.3)
Chloride: 109 mmol/L (ref 101–111)
Creatinine, Ser: 0.64 mg/dL (ref 0.44–1.00)
Creatinine, Ser: 0.58 mg/dL (ref 0.44–1.00)
GFR calc Af Amer: >60 mL/min (ref >60)
GFR calc Af Amer: >60 mL/min (ref >60)
GFR calc non Af Amer: >60 mL/min (ref >60)
Glucose, Bld: 163 mg/dL — ABNORMAL HIGH (ref 65–99)
Glucose, Bld: 166 mg/dL — ABNORMAL HIGH (ref 65–99)
Glucose, Bld: 170 mg/dL — ABNORMAL HIGH (ref 65–99)
POTASSIUM: 2.8 mmol/L — AB (ref 3.5–5.1)
POTASSIUM: 4.1 mmol/L (ref 3.5–5.1)
Potassium: 4 mmol/L (ref 3.5–5.1)
SODIUM: 141 mmol/L (ref 135–145)
SODIUM: 138 mmol/L (ref 135–145)
SODIUM: 137 mmol/L (ref 135–145)

## 2015-07-02 LAB — CBC
HEMATOCRIT: 29.4 % — AB (ref 35.0–47.0)
Hemoglobin: 10 g/dL — ABNORMAL LOW (ref 12.0–16.0)
MCH: 29.4 pg (ref 26.0–34.0)
MCHC: 33.9 g/dL (ref 32.0–36.0)
MCV: 86.6 fL (ref 80.0–100.0)
PLATELETS: 324 10*3/uL (ref 150–440)
RBC: 3.4 MIL/uL — AB (ref 3.80–5.20)
RDW: 15.3 % — AB (ref 11.5–14.5)
WBC: 10.6 10*3/uL (ref 3.6–11.0)

## 2015-07-02 LAB — URINE CULTURE

## 2015-07-02 LAB — URINALYSIS COMPLETE WITH MICROSCOPIC (ARMC ONLY)
Bilirubin Urine: NEGATIVE
GLUCOSE, UA: 50 mg/dL — AB
NITRITE: NEGATIVE
PH: 5 (ref 5.0–8.0)
PROTEIN: 100 mg/dL — AB
SPECIFIC GRAVITY, URINE: 1.016 (ref 1.005–1.030)

## 2015-07-02 LAB — PHOSPHORUS: PHOSPHORUS: 1.7 mg/dL — AB (ref 2.5–4.6)

## 2015-07-02 LAB — C DIFFICILE QUICK SCREEN W PCR REFLEX
C DIFFICILE (CDIFF) INTERP: NEGATIVE
C DIFFICILE (CDIFF) TOXIN: NEGATIVE
C DIFFICLE (CDIFF) ANTIGEN: NEGATIVE

## 2015-07-02 LAB — MAGNESIUM
MAGNESIUM: 2.3 mg/dL (ref 1.7–2.4)
Magnesium: 1.2 mg/dL — ABNORMAL LOW (ref 1.7–2.4)

## 2015-07-02 MED ORDER — SODIUM ACETATE 2 MEQ/ML IV SOLN
INTRAVENOUS | Status: DC
  Administered 2015-07-02: 02:00:00 via INTRAVENOUS
  Filled 2015-07-02 (×3): qty 1000

## 2015-07-02 MED ORDER — SODIUM CHLORIDE 0.9% FLUSH: 10.0000 mL | INTRAVENOUS | Status: DC | PRN

## 2015-07-02 MED ORDER — INSULIN GLARGINE 100 UNIT/ML ~~LOC~~ SOLN
34.0000 [IU] | SUBCUTANEOUS | Status: DC
  Administered 2015-07-02: 34 [IU] via SUBCUTANEOUS
  Filled 2015-07-02 (×2): qty 0.34

## 2015-07-02 MED ORDER — DEXTROSE 5 % IV SOLN
INTRAVENOUS | Status: DC
  Administered 2015-07-02: 14:00:00 via INTRAVENOUS

## 2015-07-02 MED ORDER — DEXTROSE-NACL 5-0.45 % IV SOLN: INTRAVENOUS | Status: DC

## 2015-07-02 MED ORDER — POTASSIUM CHLORIDE CRYS ER 20 MEQ PO TBCR
40.0000 meq | EXTENDED_RELEASE_TABLET | Freq: Once | ORAL | Status: AC
  Administered 2015-07-02: 40 meq via ORAL
  Filled 2015-07-02: qty 2

## 2015-07-02 MED ORDER — SODIUM CHLORIDE 0.9% FLUSH
10.0000 mL | Freq: Two times a day (BID) | INTRAVENOUS | Status: DC
  Administered 2015-07-02: 20 mL
  Administered 2015-07-02 – 2015-07-03 (×2): 30 mL
  Administered 2015-07-04: 09:00:00 10 mL
  Administered 2015-07-04 – 2015-07-05 (×2): 30 mL
  Administered 2015-07-05 – 2015-07-06 (×2): 10 mL

## 2015-07-02 MED ORDER — MAGNESIUM SULFATE 4 GM/100ML IV SOLN
4.0000 g | Freq: Once | INTRAVENOUS | Status: AC
  Administered 2015-07-02: 4 g via INTRAVENOUS
  Filled 2015-07-02: qty 100

## 2015-07-02 MED ORDER — POTASSIUM CHLORIDE 10 MEQ/100ML IV SOLN
10.0000 meq | INTRAVENOUS | Status: AC
  Administered 2015-07-02 (×5): 10 meq via INTRAVENOUS
  Filled 2015-07-02 (×5): qty 100

## 2015-07-02 MED ORDER — METOPROLOL SUCCINATE ER 50 MG PO TB24
100.0000 mg | ORAL_TABLET | Freq: Every day | ORAL | Status: DC
  Administered 2015-07-02 – 2015-07-07 (×5): 100 mg via ORAL
  Filled 2015-07-02 (×7): qty 2

## 2015-07-02 MED ORDER — POTASSIUM PHOSPHATES 15 MMOLE/5ML IV SOLN
30.0000 mmol | Freq: Once | INTRAVENOUS | Status: AC
  Administered 2015-07-02: 30 mmol via INTRAVENOUS
  Filled 2015-07-02: qty 10

## 2015-07-02 MED ORDER — LISINOPRIL 10 MG PO TABS
40.0000 mg | ORAL_TABLET | Freq: Every day | ORAL | Status: DC
  Administered 2015-07-02 – 2015-07-07 (×5): 40 mg via ORAL
  Filled 2015-07-02 (×3): qty 4
  Filled 2015-07-02 (×2): qty 2
  Filled 2015-07-02: qty 4

## 2015-07-02 MED ORDER — INSULIN ASPART 100 UNIT/ML ~~LOC~~ SOLN: 5.0000 [IU] | Freq: Three times a day (TID) | SUBCUTANEOUS | Status: DC

## 2015-07-02 MED ORDER — INSULIN ASPART 100 UNIT/ML ~~LOC~~ SOLN
0.0000 [IU] | SUBCUTANEOUS | Status: DC
  Administered 2015-07-02: 3 [IU] via SUBCUTANEOUS
  Administered 2015-07-02: 2 [IU] via SUBCUTANEOUS
  Administered 2015-07-02 – 2015-07-03 (×2): 3 [IU] via SUBCUTANEOUS
  Administered 2015-07-03: 5 [IU] via SUBCUTANEOUS
  Administered 2015-07-03: 3 [IU] via SUBCUTANEOUS
  Filled 2015-07-02 (×3): qty 3
  Filled 2015-07-02: qty 2
  Filled 2015-07-02: qty 5
  Filled 2015-07-02: qty 3

## 2015-07-02 NOTE — H&P (Signed)
[facility] [place] Pulmonary Medicine Consultation      Name: Sylvia Black MRN: [id number] DOB: 02/06/1945    ADMISSION DATE:  07/01/2015    CHIEF COMPLAINT:   Acute mental status changes   HISTORY OF PRESENT ILLNESS  70 yo obese white female with h/o CVA seen today and being admitted to ICU for severe acidosis Patient lethargic but arousable, noted to be very confused and had labored breathing per daughter daughter in room and answering all questions She has confirmed that patient is DNR/DNI  Patient with low blood pressure, very high FSBS with ph 7.0 Patient with increased WOB and SOB  ROS per daughter, state sthat  Patient has h/o cellulitis but states that her legs/feet look really good compared from before      PAST MEDICAL HISTORY    :  Past Medical History  Diagnosis Date  . Hypertension   . Frequent UTI   . PONV (postoperative nausea and vomiting)     "makes me violently ill" (02/22/2013)  . Family history of anesthesia complication     "daughter's w/PONV" (02/22/2013)  . High cholesterol   . Type II diabetes mellitus (HCC)   . Pneumonia 1990's    "once"  . Sleep apnea     "dx'd in the 1990's; don't wear mask" (02/22/2013)  . GERD (gastroesophageal reflux disease)   . Stroke [facility](HCC) 2010; 02/2012    denies residual; "balance issues since & weak on left side since" (02/22/2013)  . 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 Gout attack     "twice" (02/22/2013)  . Anxiety   . Depression   . Bipolar disorder (HCC)   . Vertigo    Past Surgical History  Procedure Laterality Date  . Posterior fusion cervical spine  ~ 1988  . Anterior cervical decomp/discectomy fusion  1989  . Cholecystectomy  1984  . Appendectomy  1984  . Tubal ligation  1973  . Wrist surgery Right ~ 1985    "tied off damagaed nerve" (02/22/2013)  . Cataract extraction w/ intraocular lens  implant, bilateral Bilateral ~ 2008   Prior to Admission medications     Medication Sig Start Date End Date Taking? Authorizing Provider  amLODipine (NORVASC) 10 MG tablet Take 10 mg by mouth daily.   Yes Historical Provider, MD  buPROPion (WELLBUTRIN XL) 150 MG 24 hr tablet Take 150 mg by mouth 3 (three) times daily as needed (pain).   Yes Historical Provider, MD  clonazePAM (KLONOPIN) 1 MG tablet Take 1 mg by mouth at bedtime.   Yes Historical Provider, MD  clopidogrel (PLAVIX) 75 MG tablet Take 75 mg by mouth daily with breakfast.   Yes Historical Provider, MD  Cranberry-Vitamin C-Probiotic (AZO CRANBERRY PO) Take 1 capsule by mouth daily as needed (kidney infections).   Yes Historical Provider, MD  diclofenac sodium (VOLTAREN) 1 % GEL Apply 4 g topically daily as needed (pain).   Yes Historical Provider, MD  LANTUS SOLOSTAR 100 UNIT/ML Solostar Pen Inject 70 Units into the skin every morning. 05/22/15  Yes Historical Provider, MD  lidocaine (LIDODERM) 5 % Place 1-3 patches onto the skin daily as needed. For pain. Remove & Discard patch within 12 hours or as directed by MD   Yes Historical Provider, MD  lisinopril (PRINIVIL,ZESTRIL) 40 MG tablet Take 40 mg by mouth daily after breakfast.   Yes Historical Provider, MD  meclizine (ANTIVERT) 25 MG tablet Take 25 mg by mouth  daily.    Yes Historical Provider, MD  metFORMIN (GLUCOPHAGE-XR) 500 MG 24 hr tablet Take 1,000 mg by mouth daily with breakfast.    Yes Historical Provider, MD  metoprolol succinate (TOPROL-XL) 100 MG 24 hr tablet Take 100 mg by mouth daily. Take with or immediately following a meal.   Yes Historical Provider, MD  pravastatin (PRAVACHOL) 20 MG tablet Take 20 mg by mouth every morning.    Yes Historical Provider, MD  predniSONE (DELTASONE) 10 MG tablet Take 20 mg by mouth every morning. 05/10/15  Yes Historical Provider, MD  tiZANidine (ZANAFLEX) 4 MG tablet Take 4-8 mg by mouth 2 (two) times daily. Take 1 tablet ( ) in the morning and 2 tablets ( ) at night   Yes Historical Provider, MD   ciprofloxacin (CIPRO) 500 MG tablet Take 1 tablet (500 mg total) by mouth 2 (two) times daily. 02/23/13   Clanford Cyndie Mull, MD  insulin NPH-regular Human (NOVOLIN 70/30) (70-30) 100 UNIT/ML injection Inject 20-50 Units into the skin 2 (two) times daily with a meal. Take 50 units after breakfast and 20 units after dinner 02/23/13   Clanford Cyndie Mull, MD   Allergies  Allergen Reactions  . Ciprofloxacin     Other reaction(s): Hallucinations  . Penicillins Hives    Has patient had a PCN reaction causing immediate rash, facial/tongue/throat swelling, SOB or lightheadedness with hypotension: Yes Has patient had a PCN reaction causing severe rash involving mucus membranes or skin necrosis: No Has patient had a PCN reaction that required hospitalization Yes Has patient had a PCN reaction occurring within the last 10 years: No If all of the above answers are "NO", then may proceed with Cephalosporin use.  . Sulfa Antibiotics     "hives"  . Tetanus Toxoids     "I had a strong reaction".     FAMILY HISTORY   No family history on file.    SOCIAL HISTORY    reports that she has been passively smoking.  She has never used smokeless tobacco. She reports that she drinks alcohol. She reports that she does not use illicit drugs.  Review of Systems  Constitutional: Negative for fever, chills, weight loss and malaise/fatigue.  HENT: Negative for congestion.   Respiratory: Negative for cough and shortness of breath.   Cardiovascular: Negative for chest pain.  Gastrointestinal: Negative for heartburn.  Skin: Negative for rash.  Neurological: Negative for headaches.  Psychiatric/Behavioral: Negative for depression.  All other systems reviewed and are negative.     VITAL SIGNS    Temp:  [97.9 F (36.6 C)-98.8 F (37.1 C)] 98.2 F (36.8 C) (06/11 0800) Pulse Rate:  [95-128] 108 (06/11 0800) Resp:  [14-27] 27 (06/11 0800) BP: (100-175)/(38-115) 149/53 mmHg (06/11 0800) SpO2:  [97 %-100  %] 97 % (06/11 0800) Weight:  [234 lb 5.6 oz (106.3 kg)-250 lb (113.399 kg)] 234 lb 5.6 oz (106.3 kg) (06/11 0500) HEMODYNAMICS:   VENTILATOR SETTINGS:   INTAKE / OUTPUT:  Intake/Output Summary (Last 24 hours) at 07/02/15 0820 Last data filed at 07/02/15 0730  Gross per 24 hour  Intake 3592.4 ml  Output   1150 ml  Net 2442.4 ml       PHYSICAL EXAM   Physical Exam  Constitutional: No distress.  HENT:  Head: Normocephalic and atraumatic.  Eyes: Pupils are equal, round, and reactive to light.  Cardiovascular: Normal rate, regular rhythm and normal heart sounds.   No murmur heard. Pulmonary/Chest: No respiratory distress. She has no wheezes. She  has no rales.  Abdominal: Soft. Bowel sounds are normal.  Musculoskeletal: Normal range of motion. She exhibits no edema.  B/l lower ext erythema  Neurological: She is alert.  Skin: Skin is warm. No rash noted. She is not diaphoretic.       LABS   LABS:  CBC  Recent Labs Lab 07/01/15 0830 07/01/15 1151 07/02/15 0447  WBC 15.6* 16.8* 10.6  HGB 12.4 11.8* 10.0*  HCT 38.7 36.8 29.4*  PLT 422 382 324   Coag's No results for input(s): APTT, INR in the last 168 hours. BMET  Recent Labs Lab 07/01/15 1441 07/01/15 1827 07/02/15 0447  NA 138 140 141  K 3.1* 2.6* 2.8*  CL 113* 117* 114*  CO2 8* 11* 20*  BUN 16 15 12   CREATININE 0.92 0.77 0.64  GLUCOSE 281* 149* 163*   Electrolytes  Recent Labs Lab 07/01/15 1151 07/01/15 1441 07/01/15 1827 07/02/15 0447  CALCIUM 8.8* 8.6* 8.4* 8.2*  MG 1.4*  --   --  1.2*  PHOS 3.1  --   --  <1.0*   Sepsis Markers  Recent Labs Lab 07/01/15 1151  LATICACIDVEN 1.5   ABG  Recent Labs Lab 07/01/15 2205  PHART 7.30*  PCO2ART 25*  PO2ART 94   Liver Enzymes  Recent Labs Lab 07/01/15 0830  AST 16  ALT 20  ALKPHOS 93  BILITOT 1.6*  ALBUMIN 3.2*   Cardiac Enzymes  Recent Labs Lab 07/01/15 0830  TROPONINI <0.03   Glucose  Recent Labs Lab  07/02/15 0226 07/02/15 0324 07/02/15 0427 07/02/15 0528 07/02/15 0630 07/02/15 0725  GLUCAP 171* 184* 169* 152* 143* 153*     ASSESSMENT/PLAN  71 yo obese white female with multiple medical issues with acute and severe metabolic acidosis with DKA asscoaited with SOB and labored breathing-patient clinically improved weaning off insulin drip, Bicarb infusion   PULMONARY-resp status much improved -oxygen as needed, patient is DNR/DNI  CARDIOVASCULAR CVL placed Bicarb infusion   RENAL Watch UO, place foley catheter  GASTROINTESTINAL Advance diet   HEMATOLOGIC Follow CBC  INFECTIOUS No obvious source of infection at this time -will re-evaluate need for ABX, follow fevers and WBC counts in next 24 hrs  ENDOCRINE DKA glucose stabilizer-wean off as tolerated  NEUROLOGIC Encephalopathy improved    The Patient requires high complexity decision making for assessment and support, frequent evaluation and titration of therapies, application of advanced monitoring technologies and extensive interpretation of multiple databases.   Clinically improved, Case discussed with Dr. Juliene Pina, will transfer care to Hospitalist and will transfer out of ICU in next 24 hrs  Sumeya Yontz Santiago Glad, M.D.  [facility]  Medical Director [facility] Medical Director [facility] Cardio-Pulmonary Department

## 2015-07-02 NOTE — Evaluation (Signed)
Physical Therapy Evaluation Patient Details Name: Sylvia Black MRN: [id number] DOB: 05/30/1945 Today's Date: 07/02/2015   History of Present Illness  70 y/o female here with acidosis.  She has h/o CVA, she has been lethargic with AMS - difficult to assess deifference from baseline.   Clinical Impression  Pt is laying in CCU bed in no apparent distress, but she is generally very weak and has altered mental status.  PT unsure of her normal affect and cognition, but per nurse daughter was saying that she is still not herself.  Pt is profoundly weak and it is difficult to get her to participate secondary to cognitive level, though it does seem as though she likely had very limited AROM in limbs prior to coming to the hospital.  Unable to get full answer from pt, but it seems she has no been able to even sit up at EOB for a long time.  Pt able to participate with some AA/PROM acts with U&LEs, but again is profoundly weak.      Follow Up Recommendations SNF    Equipment Recommendations       Recommendations for Other Services       Precautions / Restrictions Precautions Precautions: Fall Restrictions Weight Bearing Restrictions: No      Mobility  Bed Mobility               General bed mobility comments: Pt indicates that she does not feel ready to try sitting up today.    Transfers                 General transfer comment: pt has not stood in over a year  Ambulation/Gait                Stairs            Wheelchair Mobility    Modified Rankin (Stroke Patients Only)       Balance                                             Pertinent Vitals/Pain Pain Assessment: No/denies pain    Home Living Family/patient expects to be discharged to:: Skilled nursing facility Living Arrangements: Children                    Prior Function Level of Independence: Needs assistance         Comments: apparently pt is essentially  bed bound at home and daughter changes her diaper, etc.  Difficult for pt to communicate and at times her affect means she simply doesn't answer questions.     Hand Dominance        Extremity/Trunk Assessment   Upper Extremity Assessment: Generalized weakness (very limited AROM b/l L seems weaker, grossly 2- to 2+/5 t/o)           Lower Extremity Assessment: Generalized weakness (very limited AROM b/l L seems weaker, grossly 2- to 2+/5 t/0)         Communication   Communication: Expressive difficulties  Cognition Arousal/Alertness: Lethargic Behavior During Therapy: Flat affect Overall Cognitive Status: Impaired/Different from baseline                      General Comments      Exercises        Assessment/Plan    PT Assessment Patient needs  continued PT services  PT Diagnosis Generalized weakness   PT Problem List Decreased strength;Decreased range of motion;Decreased activity tolerance;Decreased cognition;Decreased coordination;Decreased safety awareness;Obesity  PT Treatment Interventions DME instruction;Functional mobility training;Therapeutic activities;Therapeutic exercise;Balance training;Cognitive remediation;Gait training;Patient/family education   PT Goals (Current goals can be found in the Care Plan section) Acute Rehab PT Goals Patient Stated Goal: none stated PT Goal Formulation: Patient unable to participate in goal setting Time For Goal Achievement: 07/16/15 Potential to Achieve Goals: Poor    Frequency Min 2X/week   Barriers to discharge        Co-evaluation               End of Session Equipment Utilized During Treatment: Oxygen Activity Tolerance: Patient limited by fatigue;Patient limited by lethargy Patient left: with nursing/sitter in room;in bed;with call bell/phone within reach           Time: 1329-1355 PT Time Calculation (min) (ACUTE ONLY): 26 min   Charges:   PT Evaluation $PT Eval Moderate Complexity: 1  Procedure     PT G Codes:        Malachi Pro, DPT 07/02/2015, 3:05 PM

## 2015-07-02 NOTE — Progress Notes (Signed)
Inpatient Diabetes Program Recommendations  AACE/ADA: New Consensus Statement on Inpatient Glycemic Control (2015)  Target Ranges:  Prepandial:   less than 140 mg/dL      Peak postprandial:   less than 180 mg/dL (1-2 hours)      Critically ill patients:  140 - 180 mg/dL  Results for Sylvia Black, Sylvia Black (MRN [id number]) as of 07/02/2015 10:15  Ref. Range 07/02/2015 02:26 07/02/2015 03:24 07/02/2015 04:27 07/02/2015 05:28 07/02/2015 06:30 07/02/2015 07:25 07/02/2015 08:28 07/02/2015 09:29  Glucose-Capillary Latest Ref Range: 65-99 mg/dL 409171 (H) 811184 (H) 914169 (H) 152 (H) 143 (H) 153 (H) 142 (H) 146 (H)    Review of Glycemic Control  Diabetes history: DM2 Outpatient Diabetes medications: Lantus 70 units QAM, Metformin XR 1000 mg QAM (pt reports no Metformin x 1 week due to nausea) Current orders for Inpatient glycemic control: IV insulin drip per DKA protocol  Inpatient Diabetes Program Recommendations: Insulin - Basal: At time of transition from IV to SQ insulin, please consider ordering Lantus 40 units Q24H (based on 106 kg x 0.4 units). RN discontinue insulin drip 2 hours after subcutaneous basal insulin given and simultaneously give Novolog correction scale. Correction (SSI): At time of transition from IV to SQ insulin, please consider ordering Novolog 0-15 units QH4. Insulin - Meal Coverage: At time of transition from IV to SQ insulin, please consider ordering Novolog 5 units TID with meals for meal coverage if patient eats at least 50% of meal.  NOTE: Paged by Elita QuickPam, RN in ICU caring for patient regarding transition recommendations from IV to SQ insulin.   Thanks, Orlando PennerMarie Didier Brandenburg, RN, MSN, CDE Diabetes Coordinator Inpatient Diabetes Program [phone] (Team Pager from 8am to 5pm) [phone] ([facility] office) [phone] [facility] office) [phone] [facility] office)

## 2015-07-02 NOTE — Procedures (Signed)
Central Venous Catheter Insertion Procedure Note Philippa Chestereresa P Sherrod [id number] 03/09/1945  Procedure: Insertion of Central Venous Catheter Indications: Assessment of intravascular volume, Drug and/or fluid administration and Frequent blood sampling  Procedure Details Consent: Risks of procedure as well as the alternatives and risks of each were explained to the (patient/caregiver).  Consent for procedure obtained. Time Out: Verified patient identification, verified procedure, site/side was marked, verified correct patient position, special equipment/implants available, medications/allergies/relevent history reviewed, required imaging and test results available.  Performed  Maximum sterile technique was used including antiseptics, cap, gloves, gown, hand hygiene, mask and sheet. Skin prep: Chlorhexidine; local anesthetic administered A antimicrobial bonded/coated triple lumen catheter was placed in the right internal jugular vein using the Seldinger technique. Ultrasound utilized for realtime vessel cannulation  Evaluation Blood flow good Complications: No apparent complications Patient did tolerate procedure fairly well. Chest X-ray ordered to verify placement.  CXR: normal.IJ in place  I was readily available for procedure if needed.   The guidewire was inserted and removed several times to access blood vessel  Magdalene S. [facility] ANP-BC Pulmonary and Critical Care Medicine [facility] Pager [phone] or [phone] 07/02/2015, 12:23 AM  STAFF NOTE: I, Dr. Lucie LeatherKurian David Kanija Remmel,  have personally reviewed patient's available data, including medical history, events of note, physical examination and test results as part of my evaluation. I have discussed with NP  In addition,  I personally evaluated patient and elicited key findings  Lucie LeatherKurian David Marice Angelino, M.D.  [facility]  Medical Director [facility] Medical Director [facility] Cardio-Pulmonary  Department

## 2015-07-02 NOTE — Progress Notes (Signed)
K 2.8 Phos <1.0 Mag 1.2  Magdalene, NP notified.  Orders received for electrolyte replacement.  Will continue to monitor.

## 2015-07-03 LAB — CBC
HCT: 29.6 % — ABNORMAL LOW (ref 35.0–47.0)
Hemoglobin: 9.9 g/dL — ABNORMAL LOW (ref 12.0–16.0)
MCH: 29.4 pg (ref 26.0–34.0)
MCHC: 33.5 g/dL (ref 32.0–36.0)
MCV: 87.7 fL (ref 80.0–100.0)
Platelets: 306 10*3/uL (ref 150–440)
RBC: 3.37 MIL/uL — ABNORMAL LOW (ref 3.80–5.20)
RDW: 15.8 % — AB (ref 11.5–14.5)
WBC: 9.6 10*3/uL (ref 3.6–11.0)

## 2015-07-03 LAB — BASIC METABOLIC PANEL
ANION GAP: 6 (ref 5–15)
BUN: 7 mg/dL (ref 6–20)
CALCIUM: 7.8 mg/dL — AB (ref 8.9–10.3)
CO2: 22 mmol/L (ref 22–32)
CREATININE: 0.68 mg/dL (ref 0.44–1.00)
Chloride: 107 mmol/L (ref 101–111)
GFR calc Af Amer: >60 mL/min (ref >60)
GFR calc non Af Amer: >60 mL/min (ref >60)
GLUCOSE: 210 mg/dL — AB (ref 65–99)
Potassium: 3.9 mmol/L (ref 3.5–5.1)
Sodium: 135 mmol/L (ref 135–145)

## 2015-07-03 LAB — URINE CULTURE

## 2015-07-03 LAB — GLUCOSE, CAPILLARY
GLUCOSE-CAPILLARY: 191 mg/dL — AB (ref 65–99)
GLUCOSE-CAPILLARY: 170 mg/dL — AB (ref 65–99)
GLUCOSE-CAPILLARY: 164 mg/dL — AB (ref 65–99)
Glucose-Capillary: 201 mg/dL — ABNORMAL HIGH (ref 65–99)
Glucose-Capillary: 198 mg/dL — ABNORMAL HIGH (ref 65–99)

## 2015-07-03 LAB — MAGNESIUM: MAGNESIUM: 2 mg/dL (ref 1.7–2.4)

## 2015-07-03 LAB — PHOSPHORUS: PHOSPHORUS: 3.6 mg/dL (ref 2.5–4.6)

## 2015-07-03 MED ORDER — METOCLOPRAMIDE HCL 5 MG/ML IJ SOLN
10.0000 mg | Freq: Four times a day (QID) | INTRAMUSCULAR | Status: DC
  Administered 2015-07-03 – 2015-07-04 (×3): 10 mg via INTRAVENOUS
  Filled 2015-07-03 (×3): qty 2

## 2015-07-03 MED ORDER — INSULIN ASPART 100 UNIT/ML ~~LOC~~ SOLN
0.0000 [IU] | Freq: Three times a day (TID) | SUBCUTANEOUS | Status: DC
  Administered 2015-07-03 (×2): 3 [IU] via SUBCUTANEOUS
  Administered 2015-07-04 (×2): 2 [IU] via SUBCUTANEOUS
  Administered 2015-07-04: 3 [IU] via SUBCUTANEOUS
  Administered 2015-07-05: 2 [IU] via SUBCUTANEOUS
  Administered 2015-07-05: 3 [IU] via SUBCUTANEOUS
  Administered 2015-07-06: 13:00:00 2 [IU] via SUBCUTANEOUS
  Administered 2015-07-06 – 2015-07-07 (×3): 3 [IU] via SUBCUTANEOUS
  Administered 2015-07-07: 09:00:00 5 [IU] via SUBCUTANEOUS
  Administered 2015-07-07: 3 [IU] via SUBCUTANEOUS
  Filled 2015-07-03 (×2): qty 3
  Filled 2015-07-03: qty 2
  Filled 2015-07-03 (×3): qty 3
  Filled 2015-07-03: qty 2
  Filled 2015-07-03 (×2): qty 3
  Filled 2015-07-03 (×2): qty 5
  Filled 2015-07-03: qty 3

## 2015-07-03 MED ORDER — INSULIN ASPART 100 UNIT/ML ~~LOC~~ SOLN: 0.0000 [IU] | Freq: Every day | SUBCUTANEOUS | Status: DC

## 2015-07-03 MED ORDER — ENOXAPARIN SODIUM 40 MG/0.4ML ~~LOC~~ SOLN
40.0000 mg | Freq: Two times a day (BID) | SUBCUTANEOUS | Status: DC
  Administered 2015-07-03 – 2015-07-07 (×8): 40 mg via SUBCUTANEOUS
  Filled 2015-07-03 (×9): qty 0.4

## 2015-07-03 MED ORDER — AMMONIUM LACTATE 12 % EX LOTN
TOPICAL_LOTION | Freq: Two times a day (BID) | CUTANEOUS | Status: DC
  Administered 2015-07-03: 17:00:00 via TOPICAL
  Administered 2015-07-03: 1 via TOPICAL
  Administered 2015-07-04 – 2015-07-07 (×7): via TOPICAL
  Filled 2015-07-03: qty 225

## 2015-07-03 MED ORDER — INSULIN ASPART 100 UNIT/ML ~~LOC~~ SOLN
4.0000 [IU] | Freq: Three times a day (TID) | SUBCUTANEOUS | Status: DC
  Administered 2015-07-03 – 2015-07-07 (×12): 4 [IU] via SUBCUTANEOUS
  Filled 2015-07-03 (×12): qty 4

## 2015-07-03 MED ORDER — PREDNISONE 5 MG PO TABS
5.0000 mg | ORAL_TABLET | Freq: Every day | ORAL | Status: DC
  Administered 2015-07-03 – 2015-07-07 (×5): 5 mg via ORAL
  Filled 2015-07-03 (×5): qty 1

## 2015-07-03 MED ORDER — INSULIN GLARGINE 100 UNIT/ML ~~LOC~~ SOLN
34.0000 [IU] | Freq: Every day | SUBCUTANEOUS | Status: DC
  Administered 2015-07-03 – 2015-07-04 (×2): 34 [IU] via SUBCUTANEOUS
  Filled 2015-07-03 (×3): qty 0.34

## 2015-07-03 NOTE — Care Management (Addendum)
Received page from ICU stating that patient had transferred to 1C room 116 and family was calling regarding an anticipated meeting at 1PM today. This RNCM was not aware of a family meeting. I later learned that the night shift nurse explained to family that "someone would be around to speak with her at 1PM tomorrow (Monday 07/03/15)". Patient is pending evaluation for SNF. CM consult for short term rehab order in place per Dr. Belia HemanKasa on 07/03/15 at 1306-discontinued by this RNCM. CSW will follow- consult in place per Dr. Renae GlossWieting. I spoke with Christal Vidal SchwalbeJones HCPOA daughter and she [place]- no preference but [place] [address] Best contact: [phone]. CSW updated.

## 2015-07-03 NOTE — Consult Note (Signed)
  Psychiatry: Consult received. Came by to see the patient after reviewing her chart. At supper time I found her to be so sleepy that I could not get her to wake up enough to have a conversation. Even with loudly speaking her name and shaking her shoulder she would only briefly open her eyes. Did not speak at all. I will try to follow up tomorrow for the consult. Informed her nurse of the current mental status.

## 2015-07-03 NOTE — Progress Notes (Signed)
Initial Nutrition Assessment  DOCUMENTATION CODES:   Morbid obesity  INTERVENTION:   Cater to pt preferences on Carb Modified Diet Order Will recommend supplement on follow if intake inadequate Will attempt education/review of nutrition therapy for diabetics on follow-up    NUTRITION DIAGNOSIS:   Inadequate oral intake related to acute illness as evidenced by per patient/family report.  GOAL:   Patient will meet greater than or equal to 90% of their needs  MONITOR:   PO intake, Labs, Weight trends, I & O's, Skin  REASON FOR ASSESSMENT:   Low Braden    ASSESSMENT:   Pt admitted with AMS secondary to severe metabolic acidosis ith DKA.  Past Medical History  Diagnosis Date  . Hypertension   . Frequent UTI   . PONV (postoperative nausea and vomiting)     "makes me violently ill" (02/22/2013)  . Family history of anesthesia complication     "daughter's w/PONV" (02/22/2013)  . High cholesterol   . Type II diabetes mellitus (HCC)   . Pneumonia 1990's    "once"  . Sleep apnea     "dx'd in the 1990's; don't wear mask" (02/22/2013)  . GERD (gastroesophageal reflux disease)   . Stroke [facility](HCC) 2010; 02/2012    denies residual; "balance issues since & weak on left side since" (02/22/2013)  . 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 Gout attack     "twice" (02/22/2013)  . Anxiety   . Depression   . Bipolar disorder (HCC)   . Vertigo     Diet Order:  Diet Carb Modified Fluid consistency:: Thin; Room service appropriate?: Yes   Pt reports not eating this morning as she did not like the taste. Pt reports normally eating cereal for breakfast, however when RD asked if she would like some cereal now, pt declined.  When asked about her appetite, pt reports not feeling well for the past 2 weeks PTA. Difficult to clarify any further as pt difficult to keep maintained in discussion, possibly confused.     Medications: SS novolog, Lantus Labs: 210, 170,  166, electrolytes replaced, K, P, and Mg low on admission, WDL this am.   Gastrointestinal Profile: Last BM:  07/02/2015   Nutrition-Focused Physical Exam Findings: Nutrition-Focused physical exam completed. Findings are WDL for fat depletion, muscle depletion, and edema.    Weight Change: Pt weight from 2015 per [facility] weight trends   Skin:   (b/l leg cellulitis noted)   Height:   Ht Readings from Last 1 Encounters:  07/01/15 5\' 2"  (1.575 m)    Weight:   Wt Readings from Last 1 Encounters:  07/03/15 238 lb 1.6 oz (108 kg)   Wt Readings from Last 10 Encounters:  07/03/15 238 lb 1.6 oz (108 kg)  02/23/13 208 lb 1.8 oz (94.4 kg)    Ideal Body Weight:   50kg  BMI:  Body mass index is 43.54 kg/(m^2).  Estimated Nutritional Needs:   Kcal:  1500-1750kcals  Protein:  55-65g protein  Fluid:  >/= 1.5L fluid  EDUCATION NEEDS:   Education needs no appropriate at this time  Leda QuailAllyson Shakeeta Godette, RD, LDN Pager [phone] Weekend/On-Call Pager [phone]

## 2015-07-03 NOTE — Progress Notes (Signed)
Patient ID: Sylvia Black, female   DOB: 12/23/1945, 70 y.o.   MRN: [id number] [facility] PROGRESS NOTE  Sylvia Chestereresa P Spinella WUJ:[id number] DOB: 02/10/1945 DOA: 07/01/2015 PCP: Emogene MorganAYCOCK, NGWE A, MD  HPI/Subjective: Patient awakened from sleep. 5 years ago her husband died to the day. Patient had stopped taking her medications for 10 days. Brought in by daughter. Patient complains of poor appetite. Patient has been basically bedbound since January  Objective: Filed Vitals:   07/03/15 1200 07/03/15 1255  BP: 116/51 129/37  Pulse: 70 72  Temp:  97.7 F (36.5 C)  Resp: 15 20    Filed Weights   07/01/15 0858 07/02/15 0500 07/03/15 0500  Weight: 113.399 kg (250 lb) 106.3 kg (234 lb 5.6 oz) 108 kg (238 lb 1.6 oz)    ROS: Review of Systems  Constitutional: Negative for fever and chills.  Eyes: Negative for blurred vision.  Respiratory: Negative for cough and shortness of breath.   Cardiovascular: Negative for chest pain.  Gastrointestinal: Positive for nausea. Negative for vomiting, abdominal pain, diarrhea and constipation.  Genitourinary: Negative for dysuria.  Musculoskeletal: Negative for joint pain.  Neurological: Negative for dizziness and headaches.   Exam: Physical Exam  HENT:  Nose: No mucosal edema.  Mouth/Throat: No oropharyngeal exudate or posterior oropharyngeal edema.  Eyes: Conjunctivae, EOM and lids are normal. Pupils are equal, round, and reactive to light.  Neck: No JVD present. Carotid bruit is not present. No edema present. No thyroid mass and no thyromegaly present.  Cardiovascular: S1 normal and S2 normal.  Exam reveals no gallop.   No murmur heard. Pulses:      Dorsalis pedis pulses are 2+ on the right side, and 2+ on the left side.  Respiratory: No respiratory distress. She has no wheezes. She has no rhonchi. She has no rales.  GI: Soft. Bowel sounds are normal. There is no tenderness.  Musculoskeletal:       Right ankle: She exhibits swelling.   Lymphadenopathy:    She has no cervical adenopathy.  Neurological: She is alert. No cranial nerve deficit.  Skin: Skin is warm. Nails show no clubbing.  Scaling on the feet  Psychiatric: She has a normal mood and affect.      Data Reviewed: Basic Metabolic Panel:  Recent Labs Lab 07/01/15 1151  07/01/15 1827 07/02/15 0447 07/02/15 1015 07/02/15 1500 07/03/15 0543  NA 136  < > 140 141 138 137 135  K 3.7  < > 2.6* 2.8* 4.0 4.1 3.9  CL 111  < > 117* 114* 109 108 107  CO2 <7*  < > 11* 20* 20* 21* 22  GLUCOSE 423*  < > 149* 163* 166* 170* 210*  BUN 15  < > 15 12 10 10 7   CREATININE 1.10*  < > 0.77 0.64 0.63 0.58 0.68  CALCIUM 8.8*  < > 8.4* 8.2* 8.1* 7.9* 7.8*  MG 1.4*  --   --  1.2*  --  2.3 2.0  PHOS 3.1  --   --  <1.0*  --  1.7* 3.6  < > = values in this interval not displayed. Liver Function Tests:  Recent Labs Lab 07/01/15 0830  AST 16  ALT 20  ALKPHOS 93  BILITOT 1.6*  PROT 7.0  ALBUMIN 3.2*    Recent Labs Lab 07/01/15 0830  LIPASE 42   CBC:  Recent Labs Lab 07/01/15 0830 07/01/15 1151 07/02/15 0447 07/03/15 0543  WBC 15.6* 16.8* 10.6 9.6  HGB 12.4 11.8* 10.0* 9.9*  HCT 38.7 36.8 29.4* 29.6*  MCV 90.4 91.7 86.6 87.7  PLT 422 382 324 306   Cardiac Enzymes:  Recent Labs Lab 07/01/15 0830  TROPONINI <0.03    CBG:  Recent Labs Lab 07/02/15 1954 07/02/15 2352 07/03/15 0406 07/03/15 0731 07/03/15 1116  GLUCAP 169* 181* 201* 198* 191*    Recent Results (from the past 240 hour(s))  Urine culture     Status: Abnormal   Collection Time: 07/01/15  8:30 AM  Result Value Ref Range Status   Specimen Description URINE, CATHETERIZED  Final   Special Requests NONE  Final   Culture (A)  Final    <10,000 COLONIES/mL INSIGNIFICANT GROWTH Performed at [facility]    Report Status 07/02/2015 FINAL  Final  MRSA PCR Screening     Status: None   Collection Time: 07/01/15  1:38 PM  Result Value Ref Range Status   MRSA by PCR NEGATIVE  NEGATIVE Final    Comment:        The GeneXpert MRSA Assay (FDA approved for NASAL specimens only), is one component of a comprehensive MRSA colonization surveillance program. It is not intended to diagnose MRSA infection nor to guide or monitor treatment for MRSA infections.   Urine culture     Status: Abnormal   Collection Time: 07/02/15  1:37 AM  Result Value Ref Range Status   Specimen Description URINE, CATHETERIZED  Final   Special Requests NONE  Final   Culture MULTIPLE SPECIES PRESENT, SUGGEST RECOLLECTION (A)  Final   Report Status 07/03/2015 FINAL  Final  C difficile quick scan w PCR reflex     Status: None   Collection Time: 07/02/15  4:00 PM  Result Value Ref Range Status   C Diff antigen NEGATIVE NEGATIVE Final   C Diff toxin NEGATIVE NEGATIVE Final   C Diff interpretation Negative for C. difficile  Final     Studies: Dg Chest Port 1 View  07/02/2015  CLINICAL DATA:  Central line placement.  Initial encounter. EXAM: PORTABLE CHEST 1 VIEW COMPARISON:  Chest radiograph performed 07/01/2015 FINDINGS: The lungs are well-aerated and clear. There is no evidence of focal opacification, pleural effusion or pneumothorax. The cardiomediastinal silhouette is mildly enlarged. A right IJ line is noted ending about the mid SVC. No acute osseous abnormalities are seen. Cervical spinal fusion hardware is partially imaged. IMPRESSION: 1. Right IJ line noted ending about the mid SVC. 2. Mild cardiomegaly.  Lungs remain grossly clear. Electronically Signed   By: Roanna Raider M.D.   On: 07/02/2015 00:28    Scheduled Meds: . amLODipine  5 mg Oral Daily  . ammonium lactate   Topical BID  . enoxaparin (LOVENOX) injection  40 mg Subcutaneous Q12H  . insulin aspart  0-15 Units Subcutaneous TID WC  . insulin aspart  0-5 Units Subcutaneous QHS  . insulin aspart  4 Units Subcutaneous TID WC  . insulin glargine  34 Units Subcutaneous Daily  . lisinopril  40 mg Oral Daily  . metoCLOPramide  (REGLAN) injection  10 mg Intravenous Q6H  . metoprolol succinate  100 mg Oral Daily  . pneumococcal 23 valent vaccine  0.5 mL Intramuscular Tomorrow-1000  . predniSONE  5 mg Oral Q breakfast  . sodium chloride flush  10-40 mL Intracatheter Q12H    Assessment/Plan:  1. Diabetic ketoacidosis. This has resolved. Continue sliding scale and Lantus insulin. Noncompliance with medications. 2. Nausea and poor appetite. Could be diabetic gastroparesis will start Reglan and see what happens.  3. Weakness physical therapy recommended rehabilitation 4. Essential hypertension continue lisinopril and metoprolol and amlodipine 5. Depression. Daughter requesting psychiatry consultation. Patient has not taken her medications in 10 days. 6. History of bullous pemphigus. Restart low-dose prednisone 5 mg daily. Has not been on prednisone since being here.   Code Status:     Code Status Orders        Start     Ordered   07/01/15 1052  Do not attempt resuscitation (DNR)   Continuous    Question Answer Comment  In the event of cardiac or respiratory ARREST Do not call a "code blue"   In the event of cardiac or respiratory ARREST Do not perform Intubation, CPR, defibrillation or ACLS   In the event of cardiac or respiratory ARREST Use medication by any route, position, wound care, and other measures to relive pain and suffering. May use oxygen, suction and manual treatment of airway obstruction as needed for comfort.      07/01/15 1051    Code Status History    Date Active Date Inactive Code Status Order ID Comments User Context   07/01/2015 10:31 AM 07/01/2015 10:51 AM Full Code [id number]  Erin Fulling, MD ED   02/22/2013  4:38 AM 02/23/2013  4:50 PM Full Code [id number]  Dorothea Ogle, MD Inpatient     Family Communication: Daughter at the bedside Disposition Plan: Out to rehabilitation soon  Time spent: 28 minutes  Alford Highland  [employer]

## 2015-07-03 NOTE — Progress Notes (Signed)
No distress, no complaints  Filed Vitals:   07/03/15 0600 07/03/15 0700 07/03/15 0800 07/03/15 0914  BP: 98/65 107/41 107/59 95/67  Pulse: 74 73 72   Temp: 99.4 F (37.4 C) 99.3 F (37.4 C)    TempSrc: Axillary     Resp: 17 13 16    Height:      Weight:      SpO2: 99% 99% 97%    NAD HEENT WNL No JVD Chest clear RRR s M NABS, soft Ext warm, no edema Neuro intact  BMP Latest Ref Rng 07/03/2015 07/02/2015 07/02/2015  Glucose 65 - 99 mg/dL 409(W210(H) 119(J170(H) 478(G166(H)  BUN 6 - 20 mg/dL 7 10 10   Creatinine 0.44 - 1.00 mg/dL 9.560.68 2.130.58 0.860.63  Sodium 135 - 145 mmol/L 135 137 138  Potassium 3.5 - 5.1 mmol/L 3.9 4.1 4.0  Chloride 101 - 111 mmol/L 107 108 109  CO2 22 - 32 mmol/L 22 21(L) 20(L)  Calcium 8.9 - 10.3 mg/dL 7.8(L) 7.9(L) 8.1(L)    CBC Latest Ref Rng 07/03/2015 07/02/2015 07/01/2015  WBC 3.6 - 11.0 K/uL 9.6 10.6 16.8(H)  Hemoglobin 12.0 - 16.0 g/dL 5.7(Q9.9(L) 10.0(L) 11.8(L)  Hematocrit 35.0 - 47.0 % 29.6(L) 29.4(L) 36.8  Platelets 150 - 440 K/uL 306 324 382    No new CXR  IMP/PLAN: Severe DKA - resolved Encephalopathy  Change SSI to ACHS Cont Lantus [facility] will sign off. Please call if we can be of further assistance  Billy Fischeravid Birl Lobello, MD PCCM service Mobile [phone] Pager [phone] 07/03/2015

## 2015-07-03 NOTE — Clinical Social Work Note (Signed)
Clinical Social Work Assessment  Patient Details  Name: Sylvia Black MRN: [id number] Date of Birth: 25-Feb-1945  Date of referral:  07/03/15               Reason for consult:  Facility Placement                Permission sought to share information with:  Facility Sport and exercise psychologist, Family Supports Permission granted to share information::  Yes, Verbal Permission Granted  Name::        Agency::     Relationship::     Contact Information:     Housing/Transportation Living arrangements for the past 2 months:  Single Family Home Source of Information:  Patient, Adult Children Patient Interpreter Needed:  None Criminal Activity/Legal Involvement Pertinent to Current Situation/Hospitalization:  No - Comment as needed Significant Relationships:  Adult Children Lives with:    Do you feel safe going back to the place where you live?    Need for family participation in patient care:     Care giving concerns:  Patient is requiring much assistance currently and lives with her daughter: Sylvia Black: [phone].    Social Worker assessment / plan:  Patient has now transferred out of ICU today and is on 1C medical floor. CSW met with patient and her daughter: Sylvia Black who was at bedside. Patient is very much agreeable to rehab and states she does not have a preference. Patient's daughter also states she does not have a preference. Bedsearch initiated. Patient has medicare [employer] and prior Josem Kaufmann will be required.   Employment status:  Retired Nurse, adult PT Recommendations:  [place] / Referral to community resources:  [place]  Patient/Family's Response to care:  Patient and daughter expressed appreciation for CSW visit and assistance.  Patient/Family's Understanding of and Emotional Response to Diagnosis, Current Treatment, and Prognosis:  Patient has insight into the fact that she will not be able to return  home in her current condition.  Emotional Assessment Appearance:  Appears stated age Attitude/Demeanor/Rapport:   (pleasant and cooperative) Affect (typically observed):  Accepting, Adaptable, Calm Orientation:  Oriented to Self, Oriented to Place, Oriented to Situation, Oriented to  Time Alcohol / Substance use:  Not Applicable Psych involvement (Current and /or in the community):  No (Comment)  Discharge Needs  Concerns to be addressed:  Care Coordination Readmission within the last 30 days:  No Current discharge risk:  None Barriers to Discharge:  No Barriers Identified   Shela Leff, LCSW 07/03/2015, 2:11 PM

## 2015-07-03 NOTE — Progress Notes (Signed)
Anticoagulation monitoring(Lovenox):  70 yo  ordered Lovenox 40 mg Q24h  Filed Weights   07/01/15 0858 07/02/15 0500 07/03/15 0500  Weight: 250 lb (113.399 kg) 234 lb 5.6 oz (106.3 kg) 238 lb 1.6 oz (108 kg)   Body mass index is 43.54 kg/(m^2).  Lab Results  Component Value Date   CREATININE 0.68 07/03/2015   CREATININE 0.58 07/02/2015   CREATININE 0.63 07/02/2015   Estimated Creatinine Clearance: 79 mL/min (by C-G formula based on Cr of 0.68). Hemoglobin & Hematocrit     Component Value Date/Time   HGB 9.9* 07/03/2015 0543   HGB 10.6* 10/01/2013 0416   HCT 29.6* 07/03/2015 0543   HCT 33.3* 10/01/2013 0416     Per Protocol for Patient with estCrcl> 30 ml/min and BMI > 40, will transition to Lovenox 40 mg Q12h.

## 2015-07-03 NOTE — Clinical Documentation Improvement (Signed)
Internal Medicine at [facility]  Please document query responses in the progress notes and discharge summary, not on the CDI BPA form in [facility] Please do not deactivate queries without responding to them first. Thank you!  Query 1 of 2  Please document if a condition below provides greater specificity regarding the patient's BMI of 41.98 as documented in the H&P in [facility] Doc Review:  - Morbid Obesity  - Other condition  - Unable to clinically determine  Clinical Information: Height '5\' 3"'$  per [facility] flowsheets on 07/03/15 Weight 236 lbs per [facility] flowsheets on 07/03/15 BMI 42.1 per [facility] flowsheets  Query 2 of 2  Please document if a condition below provides greater specificity regarding the patient's renal function this admission:  - Acute Kidney Injury, including any associated causes and/or conditions  - Other condition  - Unable to clinically determine  Clinical Information: Patient's Creatinine improved by 0.46 mg/dL from 07/01/14 to 07/03/15 BUN/CR/GFR trend this admission       (white female) Component     Latest Ref Rng 07/01/2015         8:30 AM  BUN     6 - 20 mg/dL 13  Creatinine     0.44 - 1.00 mg/dL 0.96  EGFR (Non-African Amer.)     >60 mL/min 60 (L)   Component     Latest Ref Rng 07/01/2015        11:51 AM  BUN     6 - 20 mg/dL 15  Creatinine     0.44 - 1.00 mg/dL 1.10 (H)  Calcium     8.9 - 10.3 mg/dL 8.8 (L)  EGFR (Non-African Amer.)     >60 mL/min 51 (L)   Component     Latest Ref Rng 07/01/2015 07/01/2015 07/02/2015 07/02/2015         2:41 PM  6:27 PM  4:47 AM 10:15 AM  BUN     6 - 20 mg/dL '16 15 12 10  '$ Creatinine     0.44 - 1.00 mg/dL 0.92 0.77 0.64 0.63  EGFR (Non-African Amer.)     >60 mL/min >60 >60 >60 >60   Component     Latest Ref Rng 07/02/2015 07/03/2015         3:00 PM   BUN     6 - 20 mg/dL 10 7  EGFR (Non-African Amer.)     >60 mL/min >60 >60    Please exercise your independent, professional judgment when responding. A specific answer is not  anticipated or expected.   Thank You, [place] [phone]

## 2015-07-03 NOTE — Progress Notes (Signed)
Chaplain rounded the unit provided a compasinate presence to the patient. Jefm PettyChaplain Fortino Haag [phone]

## 2015-07-03 NOTE — Progress Notes (Signed)
Inpatient Diabetes Program Recommendations  AACE/ADA: New Consensus Statement on Inpatient Glycemic Control (2015)  Target Ranges:  Prepandial:   less than 140 mg/dL      Peak postprandial:   less than 180 mg/dL (1-2 hours)      Critically ill patients:  140 - 180 mg/dL  Results for Sylvia Black, [name] (MRN [id number]) as of 07/03/2015 09:10  Ref. Range 07/02/2015 08:28 07/02/2015 09:29 07/02/2015 10:29 07/02/2015 11:30 07/02/2015 12:23 07/02/2015 13:25 07/02/2015 15:56 07/02/2015 19:54 07/02/2015 23:52 07/03/2015 04:06 07/03/2015 07:31  Glucose-Capillary Latest Ref Range: 65-99 mg/dL 409142 (H) 811146 (H) 914174 (H)  Lantus 34 units @ 11:16 169 (H) 152 (H) 146 (H)  Novolog 2 units 171 (H)  Novolog 3 units 169 (H)  Novolog 3 units 181 (H)  Novolog 3 units 201 (H)  Novolog 5 units 198 (H)  Novolog 3 units    Review of Glycemic Control  Diabetes history: DM2 Outpatient Diabetes medications: Lantus 70 units QAM, Metformin XR 1000 mg QAM (pt reports no Metformin x 1 week due to nausea) Current orders for Inpatient glycemic control: Lantus 34 units daily, Novolog 4 units TID with meals, Novolog 0-15 units TID with meals, Novolog 0-5 units QHS  Inpatient Diabetes Program Recommendations Insulin - Basal: Patient was given Lantus 34 units on 07/02/15 at time of transition off IV insulin to SQ insulin. Patient has received a total of Novolog 19 units for correction since transitioning off IV insulin. Please consider increasing Lantus to 40 units daily.  Thanks, Orlando PennerMarie Aerion Bagdasarian, RN, MSN, CDE Diabetes Coordinator Inpatient Diabetes Program [phone] (Team Pager from 8am to 5pm) [phone] (AP office) [phone] [facility] office) [phone] [facility] office)

## 2015-07-03 NOTE — NC FL2 (Signed)
[place] MEDICAID FL2 LEVEL OF CARE SCREENING TOOL     IDENTIFICATION  Patient Name: Sylvia Black Birthdate: 03/05/1945 Sex: female Admission Date (Current Location): 07/01/2015  [place]   Facility and Address:  [facility], [address], [place], [place] [address]      Provider Number: [id number]  Attending Physician Name and Address:  Alford Highlandichard Wieting, MD  Relative Name and Phone Number:       Current Level of Care: Hospital Recommended Level of Care: Skilled Nursing Facility Prior Approval Number:    Date Approved/Denied:   PASRR Number: [id number]  Discharge Plan: SNF    Current Diagnoses: Patient Active Problem List   Diagnosis Date Noted  . Altered mental status   . Diabetic ketoacidosis without coma associated with type 2 diabetes mellitus (HCC)   . Encounter for central line placement   . Acidosis 07/01/2015  . Stroke (HCC)   . Hypertension   . Frequent UTI   . Diabetes mellitus without complication (HCC)   . Hypoglycemia 02/21/2013    Orientation RESPIRATION BLADDER Height & Weight     Self, Time, Situation, Place  Normal Incontinent Weight: 238 lb 1.6 oz (108 kg) Height:  5\' 2"  (157.5 cm)  BEHAVIORAL SYMPTOMS/MOOD NEUROLOGICAL BOWEL NUTRITION STATUS   (none)  (none) Incontinent Diet (carb modified)  AMBULATORY STATUS COMMUNICATION OF NEEDS Skin   Extensive Assist Verbally Normal                       Personal Care Assistance Level of Assistance  Bathing, Feeding, Dressing Bathing Assistance: Maximum assistance Feeding assistance: Limited assistance Dressing Assistance: Maximum assistance     Functional Limitations Info  Sight, Hearing, Speech Sight Info: Adequate Hearing Info: Adequate Speech Info: Adequate    SPECIAL CARE FACTORS FREQUENCY  PT (By licensed PT)                    Contractures Contractures Info: Not present    Additional Factors Info  Code  Status, Allergies Code Status Info: DNR Allergies Info: cipro, pcn's, sulfa abx,tetanus, toxoids           Current Medications (07/03/2015):  This is the current hospital active medication list Current Facility-Administered Medications  Medication Dose Route Frequency Provider Last Rate Last Dose  . 0.9 %  sodium chloride infusion  250 mL Intravenous PRN Erin FullingKurian Kasa, MD      . acetaminophen (TYLENOL) tablet 650 mg  650 mg Oral Q4H PRN Erin FullingKurian Kasa, MD      . amLODipine (NORVASC) tablet 5 mg  5 mg Oral Daily Karl ItoSteven E Sommer, MD   5 mg at 07/02/15 1041  . ammonium lactate (LAC-HYDRIN) 12 % lotion   Topical BID Alford Highlandichard Wieting, MD      . enoxaparin (LOVENOX) injection 40 mg  40 mg Subcutaneous Q12H Erin FullingKurian Kasa, MD      . hydrALAZINE (APRESOLINE) injection 10 mg  10 mg Intravenous Q4H PRN Karl ItoSteven E Sommer, MD   10 mg at 07/02/15 0630  . insulin aspart (novoLOG) injection 0-15 Units  0-15 Units Subcutaneous TID WC Merwyn Katosavid B Simonds, MD   3 Units at 07/03/15 1200  . insulin aspart (novoLOG) injection 0-5 Units  0-5 Units Subcutaneous QHS Merwyn Katosavid B Simonds, MD      . insulin aspart (novoLOG) injection 4 Units  4 Units Subcutaneous TID WC Merwyn Katosavid B Simonds, MD   4 Units at 07/03/15 1200  .  insulin glargine (LANTUS) injection 34 Units  34 Units Subcutaneous Daily Merwyn Katos, MD   34 Units at 07/03/15 0914  . lisinopril (PRINIVIL,ZESTRIL) tablet 40 mg  40 mg Oral Daily Erin Fulling, MD   40 mg at 07/02/15 1040  . metoCLOPramide (REGLAN) injection 10 mg  10 mg Intravenous Q6H Alford Highland, MD      . metoprolol succinate (TOPROL-XL) 24 hr tablet 100 mg  100 mg Oral Daily Erin Fulling, MD   100 mg at 07/02/15 1041  . ondansetron (ZOFRAN) injection 4 mg  4 mg Intravenous Q6H PRN Erin Fulling, MD   4 mg at 07/03/15 1315  . pneumococcal 23 valent vaccine (PNU-IMMUNE) injection 0.5 mL  0.5 mL Intramuscular Tomorrow-1000 Erin Fulling, MD      . predniSONE (DELTASONE) tablet 5 mg  5 mg Oral Q breakfast Alford Highland, MD      . sodium chloride flush (NS) 0.9 % injection 10-40 mL  10-40 mL Intracatheter Q12H Lewie Loron, NP   20 mL at 07/02/15 2100  . sodium chloride flush (NS) 0.9 % injection 10-40 mL  10-40 mL Intracatheter PRN Lewie Loron, NP         Discharge Medications: Please see discharge summary for a list of discharge medications.  Relevant Imaging Results:  Relevant Lab Results:   Additional Information SS: [id number]  York Spaniel, LCSW

## 2015-07-04 DIAGNOSIS — F329 Major depressive disorder, single episode, unspecified: Secondary | ICD-10-CM

## 2015-07-04 DIAGNOSIS — F0630 Mood disorder due to known physiological condition, unspecified: Secondary | ICD-10-CM

## 2015-07-04 LAB — BASIC METABOLIC PANEL
ANION GAP: 6 (ref 5–15)
BUN: 6 mg/dL (ref 6–20)
CO2: 24 mmol/L (ref 22–32)
Calcium: 8.1 mg/dL — ABNORMAL LOW (ref 8.9–10.3)
Chloride: 109 mmol/L (ref 101–111)
Creatinine, Ser: 0.59 mg/dL (ref 0.44–1.00)
GFR calc Af Amer: >60 mL/min (ref >60)
GFR calc non Af Amer: >60 mL/min (ref >60)
GLUCOSE: 173 mg/dL — AB (ref 65–99)
POTASSIUM: 3.6 mmol/L (ref 3.5–5.1)
Sodium: 139 mmol/L (ref 135–145)

## 2015-07-04 LAB — GLUCOSE, CAPILLARY
Glucose-Capillary: 192 mg/dL — ABNORMAL HIGH (ref 65–99)
Glucose-Capillary: 203 mg/dL — ABNORMAL HIGH (ref 65–99)
Glucose-Capillary: 125 mg/dL — ABNORMAL HIGH (ref 65–99)
Glucose-Capillary: 94 mg/dL (ref 65–99)

## 2015-07-04 MED ORDER — METOCLOPRAMIDE HCL 10 MG PO TABS
10.0000 mg | ORAL_TABLET | Freq: Three times a day (TID) | ORAL | Status: DC
  Administered 2015-07-04 – 2015-07-07 (×13): 10 mg via ORAL
  Filled 2015-07-04 (×14): qty 1

## 2015-07-04 MED ORDER — BUPROPION HCL ER (XL) 150 MG PO TB24
300.0000 mg | ORAL_TABLET | Freq: Every day | ORAL | Status: DC
  Administered 2015-07-04 – 2015-07-07 (×4): 300 mg via ORAL
  Filled 2015-07-04 (×4): qty 2

## 2015-07-04 NOTE — Progress Notes (Signed)
Physical Therapy Treatment Patient Details Name: Sylvia Black MRN: [id number] DOB: 04/11/1945 Today's Date: 07/04/2015    History of Present Illness 70 y/o female here with acidosis.  She has h/o CVA, she has been lethargic with AMS - difficult to assess deifference from baseline.     PT Comments    Pt in bed read for session.  Supine exercises as described below.  Pt with good effort and motivation requesting additional exercises at end of session stating "I want to get out of here".    Pt continues with global weakness requiring assistance for LLE for exercises and decreased active range on right due to weakness.  Left UE/LE weaker than right with exercises.  Pt with poor bed mobility and ability to assist staff with turning or to reposition without assist.  She needs physical assist to help her reach handrail.  She attempts but is unable to clear her hips for bridging.  She is able to complete a slight pelvic tilt.  Pt may need mechanical lift for transfers due profound weakness for safety.   Follow Up Recommendations  SNF     Equipment Recommendations       Recommendations for Other Services       Precautions / Restrictions Precautions Precautions: Fall Restrictions Weight Bearing Restrictions: No    Mobility  Bed Mobility Overal bed mobility: Needs Assistance;+2 for physical assistance Bed Mobility: Rolling Rolling: Max assist         General bed mobility comments: pt needs help reaching handrail and cannot initiate rolling,  max a to get to sidelying, unable to hold position  Transfers                 General transfer comment: pt has not stood in over a year although pt reports being independant with ambulation in her home.  Ambulation/Gait                 Stairs            Wheelchair Mobility    Modified Rankin (Stroke Patients Only)       Balance                                    Cognition Arousal/Alertness:  Awake/alert Behavior During Therapy: WFL for tasks assessed/performed Overall Cognitive Status: Within Functional Limits for tasks assessed                      Exercises General Exercises - Upper Extremity Shoulder Flexion: AROM;Both;5 reps;Supine Elbow Flexion: AROM;Both;10 reps;Supine Digit Composite Flexion: AROM;Both;5 reps;Supine General Exercises - Lower Extremity Ankle Circles/Pumps: AROM;Both;10 reps;Supine Quad Sets: AROM;Both;10 reps;Supine Gluteal Sets: AROM;Both;5 reps;Supine Short Arc Quad: Both;Supine;5 reps;AROM;Right;AAROM;Left Heel Slides: AROM;Right;AAROM;Left;10 reps;Supine Hip ABduction/ADduction: AROM;Right;AAROM;Left;10 reps;Supine Straight Leg Raises: AAROM;Both;10 reps;Supine Other Exercises Other Exercises: bridges x 5 with poor clearance Other Exercises: rolling left and tright in bed x 5 with max assist    General Comments        Pertinent Vitals/Pain Pain Assessment: No/denies pain    Home Living                      Prior Function            PT Goals (current goals can now be found in the care plan section) Progress towards PT goals: Progressing toward goals    Frequency  Min 2X/week  PT Plan Current plan remains appropriate    Co-evaluation             End of Session   Activity Tolerance: Patient tolerated treatment well Patient left: in bed;with call bell/phone within reach;with bed alarm set;with nursing/sitter in room     Time: 0840-0905 PT Time Calculation (min) (ACUTE ONLY): 25 min  Charges:  $Therapeutic Exercise: 8-22 mins $Therapeutic Activity: 8-22 mins                    G Codes:      Danielle Dess, PTA 07/04/2015, 9:46 AM

## 2015-07-04 NOTE — Consult Note (Signed)
[facility] Face-to-Face Psychiatry Consult   Reason for Consult:  Consult for 69-year-old woman with multiple medical problems and history of depression Referring Physician:  Wieting Patient Identification: Sylvia Black MRN:  [id number] Principal Diagnosis: Major depression moderate recurrent Diagnosis:   Patient Active Problem List   Diagnosis Date Noted  . Depression [F32.9] 07/04/2015  . Altered mental status [R41.82]   . Diabetic ketoacidosis without coma associated with type 2 diabetes mellitus (HCC) [E13.10]   . Encounter for central line placement [Z45.2]   . Acidosis [E87.2] 07/01/2015  . Stroke (HCC) [I63.9]   . Hypertension [I10]   . Frequent UTI [N39.0]   . Diabetes mellitus without complication (HCC) [E11.9]   . Hypoglycemia [E16.2] 02/21/2013    Total Time spent with patient: 1 hour  Subjective:   Sylvia Black is a 69 y.o. female patient admitted with "I haven't been doing well for a while".  HPI:  Patient interviewed. Chart reviewed. Labs and vitals reviewed. Patient's daughter apparently had requested a psychiatric consult. Patient came into the hospital with diabetic ketoacidosis. There was a note in the chart that she had been not taking her medicine for about 2 days. On interview today the patient is aware that she will came in because of problems with her blood sugar. She said that the only reason she had not been taking her medicine was because she was throwing up constantly for several days. She's been feeling nauseous for about a week. Been feeling tired. Poor sleep. As far as her mood she says that most of her mood recently has been angry. Not to a severe degree but angry at herself and irritated that her health is getting in the way of her life. She says she does feel depressed but denies feeling hopeless. Has some problems with sleep some appetite problems but a lot of medical problems. She denies having any suicidal ideation. Denies any wish to die. Indicates that she  has positive things that she wants to get better for although she currently has little enjoyment in life. She says that up until the time that she got sick a few days ago she had been taking her Wellbutrin which is prescribed by her pain clinic although she thinks of that is more of a pain medicine.  Social history: Patient lives with her daughter and grandson. Her husband died 5 years ago. It's been hard for her ever since he died. Has not been functioning well and her function is gotten worse over the last several months. She think she probably hasn't gotten out of bed in 6 months.  Medical history: Obesity, diabetes, history of strokes and high blood pressure  Substance abuse history: Denies any history of alcohol or drug abuse.  Past Psychiatric History: Patient indicates that she has had psychiatric admissions before but was about 15 years ago. She denies that she's ever tried to kill her self. She cannot remember any antidepressant she has been prescribed besides Wellbutrin.  Risk to Self: Is patient at risk for suicide?: No Risk to Others:   Prior Inpatient Therapy:   Prior Outpatient Therapy:    Past Medical History:  Past Medical History  Diagnosis Date  . Hypertension   . Frequent UTI   . PONV (postoperative nausea and vomiting)     "makes me violently ill" (02/22/2013)  . Family history of anesthesia complication     "daughter's w/PONV" (02/22/2013)  . High cholesterol   . Type II diabetes mellitus (HCC)   .   Pneumonia 1990's    "once"  . Sleep apnea     "dx'd in the 1990's; don't wear mask" (02/22/2013)  . GERD (gastroesophageal reflux disease)   . Stroke (HCC) 2010; 02/2012    denies residual; "balance issues since & weak on left side since" (02/22/2013)  . 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 Gout attack     "twice" (02/22/2013)  . Anxiety   . Depression   . Bipolar disorder (HCC)   . Vertigo     Past Surgical History  Procedure  Laterality Date  . Posterior fusion cervical spine  ~ 1988  . Anterior cervical decomp/discectomy fusion  1989  . Cholecystectomy  1984  . Appendectomy  1984  . Tubal ligation  1973  . Wrist surgery Right ~ 1985    "tied off damagaed nerve" (02/22/2013)  . Cataract extraction w/ intraocular lens  implant, bilateral Bilateral ~ 2008   Family History: No family history on file. Family Psychiatric  History: She indicates that there are a lot of emotional problems and several members of her family. Social History:  History  Alcohol Use  . Yes    Comment: 02/22/2013 "mixed drink on special occasions; glass of wine q 2-3 months"     History  Drug Use No    Social History   Social History  . Marital Status: Widowed    Spouse Name: N/A  . Number of Children: N/A  . Years of Education: N/A   Social History Main Topics  . Smoking status: Passive Smoke Exposure - Never Smoker  . Smokeless tobacco: Never Used  . Alcohol Use: Yes     Comment: 02/22/2013 "mixed drink on special occasions; glass of wine q 2-3 months"  . Drug Use: No  . Sexual Activity: No   Other Topics Concern  . None   Social History Narrative   Additional Social History:    Allergies:   Allergies  Allergen Reactions  . Ciprofloxacin     Other reaction(s): Hallucinations  . Penicillins Hives    Has patient had a PCN reaction causing immediate rash, facial/tongue/throat swelling, SOB or lightheadedness with hypotension: Yes Has patient had a PCN reaction causing severe rash involving mucus membranes or skin necrosis: No Has patient had a PCN reaction that required hospitalization Yes Has patient had a PCN reaction occurring within the last 10 years: No If all of the above answers are "NO", then may proceed with Cephalosporin use.  . Sulfa Antibiotics     "hives"  . Tetanus Toxoids     "I had a strong reaction".    Labs:  Results for orders placed or performed during the hospital encounter of 07/01/15 (from  the past 48 hour(s))  Glucose, capillary     Status: Abnormal   Collection Time: 07/02/15  7:54 PM  Result Value Ref Range   Glucose-Capillary 169 (H) 65 - 99 mg/dL  Glucose, capillary     Status: Abnormal   Collection Time: 07/02/15 11:52 PM  Result Value Ref Range   Glucose-Capillary 181 (H) 65 - 99 mg/dL  Glucose, capillary     Status: Abnormal   Collection Time: 07/03/15  4:06 AM  Result Value Ref Range   Glucose-Capillary 201 (H) 65 - 99 mg/dL  CBC     Status: Abnormal   Collection Time: 07/03/15  5:43 AM  Result Value Ref Range   WBC 9.6 3.6 - 11.0 K/uL     RBC 3.37 (L) 3.80 - 5.20 MIL/uL   Hemoglobin 9.9 (L) 12.0 - 16.0 g/dL   HCT 29.6 (L) 35.0 - 47.0 %   MCV 87.7 80.0 - 100.0 fL   MCH 29.4 26.0 - 34.0 pg   MCHC 33.5 32.0 - 36.0 g/dL   RDW 15.8 (H) 11.5 - 14.5 %   Platelets 306 150 - 440 K/uL  Magnesium     Status: None   Collection Time: 07/03/15  5:43 AM  Result Value Ref Range   Magnesium 2.0 1.7 - 2.4 mg/dL  Phosphorus     Status: None   Collection Time: 07/03/15  5:43 AM  Result Value Ref Range   Phosphorus 3.6 2.5 - 4.6 mg/dL  Basic metabolic panel     Status: Abnormal   Collection Time: 07/03/15  5:43 AM  Result Value Ref Range   Sodium 135 135 - 145 mmol/L   Potassium 3.9 3.5 - 5.1 mmol/L   Chloride 107 101 - 111 mmol/L   CO2 22 22 - 32 mmol/L   Glucose, Bld 210 (H) 65 - 99 mg/dL   BUN 7 6 - 20 mg/dL   Creatinine, Ser 0.68 0.44 - 1.00 mg/dL   Calcium 7.8 (L) 8.9 - 10.3 mg/dL   GFR calc non Af Amer >60 >60 mL/min   GFR calc Af Amer >60 >60 mL/min    Comment: (NOTE) The eGFR has been calculated using the CKD EPI equation. This calculation has not been validated in all clinical situations. eGFR's persistently <60 mL/min signify possible Chronic Kidney Disease.    Anion gap 6 5 - 15  Glucose, capillary     Status: Abnormal   Collection Time: 07/03/15  7:31 AM  Result Value Ref Range   Glucose-Capillary 198 (H) 65 - 99 mg/dL  Glucose, capillary      Status: Abnormal   Collection Time: 07/03/15 11:16 AM  Result Value Ref Range   Glucose-Capillary 191 (H) 65 - 99 mg/dL  Glucose, capillary     Status: Abnormal   Collection Time: 07/03/15  5:11 PM  Result Value Ref Range   Glucose-Capillary 170 (H) 65 - 99 mg/dL  Glucose, capillary     Status: Abnormal   Collection Time: 07/03/15  8:48 PM  Result Value Ref Range   Glucose-Capillary 164 (H) 65 - 99 mg/dL  Basic metabolic panel     Status: Abnormal   Collection Time: 07/04/15  5:39 AM  Result Value Ref Range   Sodium 139 135 - 145 mmol/L   Potassium 3.6 3.5 - 5.1 mmol/L   Chloride 109 101 - 111 mmol/L   CO2 24 22 - 32 mmol/L   Glucose, Bld 173 (H) 65 - 99 mg/dL   BUN 6 6 - 20 mg/dL   Creatinine, Ser 0.59 0.44 - 1.00 mg/dL   Calcium 8.1 (L) 8.9 - 10.3 mg/dL   GFR calc non Af Amer >60 >60 mL/min   GFR calc Af Amer >60 >60 mL/min    Comment: (NOTE) The eGFR has been calculated using the CKD EPI equation. This calculation has not been validated in all clinical situations. eGFR's persistently <60 mL/min signify possible Chronic Kidney Disease.    Anion gap 6 5 - 15  Glucose, capillary     Status: Abnormal   Collection Time: 07/04/15  8:48 AM  Result Value Ref Range   Glucose-Capillary 192 (H) 65 - 99 mg/dL  Glucose, capillary     Status: Abnormal   Collection Time: 07/04/15 12:14  PM  Result Value Ref Range   Glucose-Capillary 203 (H) 65 - 99 mg/dL  Glucose, capillary     Status: Abnormal   Collection Time: 07/04/15  4:48 PM  Result Value Ref Range   Glucose-Capillary 125 (H) 65 - 99 mg/dL    Current Facility-Administered Medications  Medication Dose Route Frequency Provider Last Rate Last Dose  . 0.9 %  sodium chloride infusion  250 mL Intravenous PRN Kurian Kasa, MD      . acetaminophen (TYLENOL) tablet 650 mg  650 mg Oral Q4H PRN Kurian Kasa, MD      . amLODipine (NORVASC) tablet 5 mg  5 mg Oral Daily Steven E Sommer, MD   5 mg at 07/04/15 0906  . ammonium lactate  (LAC-HYDRIN) 12 % lotion   Topical BID Richard Wieting, MD      . buPROPion (WELLBUTRIN XL) 24 hr tablet 300 mg  300 mg Oral Daily John T Clapacs, MD      . enoxaparin (LOVENOX) injection 40 mg  40 mg Subcutaneous Q12H Kurian Kasa, MD   40 mg at 07/04/15 0905  . insulin aspart (novoLOG) injection 0-15 Units  0-15 Units Subcutaneous TID WC David B Simonds, MD   2 Units at 07/04/15 1722  . insulin aspart (novoLOG) injection 0-5 Units  0-5 Units Subcutaneous QHS David B Simonds, MD   0 Units at 07/03/15 2249  . insulin aspart (novoLOG) injection 4 Units  4 Units Subcutaneous TID WC David B Simonds, MD   4 Units at 07/04/15 1723  . insulin glargine (LANTUS) injection 34 Units  34 Units Subcutaneous Daily David B Simonds, MD   34 Units at 07/04/15 0907  . lisinopril (PRINIVIL,ZESTRIL) tablet 40 mg  40 mg Oral Daily Kurian Kasa, MD   40 mg at 07/04/15 0905  . metoCLOPramide (REGLAN) tablet 10 mg  10 mg Oral TID AC & HS Richard Wieting, MD   10 mg at 07/04/15 1722  . metoprolol succinate (TOPROL-XL) 24 hr tablet 100 mg  100 mg Oral Daily Kurian Kasa, MD   100 mg at 07/04/15 0909  . ondansetron (ZOFRAN) injection 4 mg  4 mg Intravenous Q6H PRN Kurian Kasa, MD   4 mg at 07/03/15 1315  . pneumococcal 23 valent vaccine (PNU-IMMUNE) injection 0.5 mL  0.5 mL Intramuscular Tomorrow-1000 Kurian Kasa, MD   0.5 mL at 07/03/15 1753  . predniSONE (DELTASONE) tablet 5 mg  5 mg Oral Q breakfast Richard Wieting, MD   5 mg at 07/04/15 0905  . sodium chloride flush (NS) 0.9 % injection 10-40 mL  10-40 mL Intracatheter Q12H Magadalene S Tukov, NP   10 mL at 07/04/15 0909  . sodium chloride flush (NS) 0.9 % injection 10-40 mL  10-40 mL Intracatheter PRN Magadalene S Tukov, NP        Musculoskeletal: Strength & Muscle Tone: decreased Gait & Station: unable to stand Patient leans: N/A  Psychiatric Specialty Exam: Physical Exam  Nursing note and vitals reviewed. Constitutional: She appears well-developed and  well-nourished.  HENT:  Head: Normocephalic and atraumatic.  Eyes: Conjunctivae are normal. Pupils are equal, round, and reactive to light.  Neck: Normal range of motion.  Cardiovascular: Regular rhythm and normal heart sounds.   Respiratory: Effort normal. No respiratory distress.  GI: Soft.  Musculoskeletal: Normal range of motion.  Neurological: She is alert.  Skin: Skin is warm and dry.  Psychiatric: She has a normal mood and affect. Her speech is normal and behavior is normal. Judgment   and thought content normal. Cognition and memory are normal.    Review of Systems  Constitutional: Negative.   HENT: Negative.   Eyes: Negative.   Respiratory: Negative.   Cardiovascular: Negative.   Gastrointestinal: Negative.   Musculoskeletal: Negative.   Skin: Negative.   Neurological: Negative.   Psychiatric/Behavioral: Positive for depression. Negative for suicidal ideas, hallucinations, memory loss and substance abuse. The patient is not nervous/anxious and does not have insomnia.     Blood pressure 145/88, pulse 80, temperature 98.2 F (36.8 C), temperature source Oral, resp. rate 17, height 5' 3" (1.6 m), weight 102.876 kg (226 lb 12.8 oz), SpO2 100 %.Body mass index is 40.19 kg/(m^2).  General Appearance: Casual  Eye Contact:  Good  Speech:  Normal Rate  Volume:  Normal  Mood:  Dysphoric  Affect:  Appropriate  Thought Process:  Goal Directed  Orientation:  Full (Time, Place, and Person)  Thought Content:  Logical  Suicidal Thoughts:  No  Homicidal Thoughts:  No  Memory:  Immediate;   Good Recent;   Poor Remote;   Fair  Judgement:  Fair  Insight:  Fair  Psychomotor Activity:  Decreased  Concentration:  Concentration: Fair  Recall:  [employer] of Knowledge:  Fair  Language:  Fair  Akathisia:  No  Handed:  Right  AIMS (if indicated):     Assets:  Communication Skills Desire for Improvement Housing Resilience Social Support  ADL's:  Impaired  Cognition:  WNL  Sleep:         Treatment Plan Summary: Plan 70 year old woman who has multiple symptoms of depression. She is lucid cooperative and appears to be appropriately forthcoming. There is no sign of suicidal ideation or dangerous behavior. Patient denies that she was intentionally avoiding her drugs. She is already on an antidepressant medicine. Given her current mental status and history I am not in favor of adding or changing her antidepressants. Continue the Wellbutrin at the somewhat more typical dosing routine of 300 mg extended release once a day. Follow-up in the hospital as needed. Patient very much wants to and very much needs to be seeing a psychotherapist after discharge.  Disposition: Patient does not meet criteria for psychiatric inpatient admission. Supportive therapy provided about ongoing stressors.  Alethia Berthold, MD 07/04/2015 6:32 PM

## 2015-07-04 NOTE — Progress Notes (Signed)
Pt exhausted after many family members visited with her.  She has been sleeping soundly since shift change with exception of bed changes.

## 2015-07-04 NOTE — Progress Notes (Signed)
Patient ID: Sylvia Black, female   DOB: 11/02/1945, 70 y.o.   MRN: [id number] [facility] PROGRESS NOTE  Sylvia Black [id number] DOB: 06/24/1945 DOA: 07/01/2015 PCP: Emogene MorganAYCOCK, NGWE A, MD  HPI/Subjective: Patient states that she feels better today. Not as nauseous and she ate breakfast. She does not remember what happened yesterday evening for dinner.  Objective: Filed Vitals:   07/04/15 1229 07/04/15 1229  BP: 145/88   Pulse: 80   Temp:  98.2 F (36.8 C)  Resp:      Filed Weights   07/03/15 0500 07/03/15 1255 07/04/15 0500  Weight: 108 kg (238 lb 1.6 oz) 107.457 kg (236 lb 14.4 oz) 102.876 kg (226 lb 12.8 oz)    ROS: Review of Systems  Constitutional: Negative for fever and chills.  Eyes: Negative for blurred vision.  Respiratory: Negative for cough and shortness of breath.   Cardiovascular: Negative for chest pain.  Gastrointestinal: Negative for nausea, vomiting, abdominal pain, diarrhea and constipation.  Genitourinary: Negative for dysuria.  Musculoskeletal: Negative for joint pain.  Neurological: Negative for dizziness and headaches.   Exam: Physical Exam  HENT:  Nose: No mucosal edema.  Mouth/Throat: No oropharyngeal exudate or posterior oropharyngeal edema.  Eyes: Conjunctivae, EOM and lids are normal. Pupils are equal, round, and reactive to light.  Neck: No JVD present. Carotid bruit is not present. No edema present. No thyroid mass and no thyromegaly present.  Cardiovascular: S1 normal and S2 normal.  Exam reveals no gallop.   No murmur heard. Pulses:      Dorsalis pedis pulses are 2+ on the right side, and 2+ on the left side.  Respiratory: No respiratory distress. She has no wheezes. She has no rhonchi. She has no rales.  GI: Soft. Bowel sounds are normal. There is no tenderness.  Musculoskeletal:       Right ankle: She exhibits swelling.       Left ankle: She exhibits swelling.  Lymphadenopathy:    She has no cervical adenopathy.  Neurological:  She is alert. No cranial nerve deficit.  Skin: Skin is warm. Nails show no clubbing.  Scaling on the feet  Psychiatric: She has a normal mood and affect.      Data Reviewed: Basic Metabolic Panel:  Recent Labs Lab 07/01/15 1151  07/02/15 0447 07/02/15 1015 07/02/15 1500 07/03/15 0543 07/04/15 0539  NA 136  < > 141 138 137 135 139  K 3.7  < > 2.8* 4.0 4.1 3.9 3.6  CL 111  < > 114* 109 108 107 109  CO2 <7*  < > 20* 20* 21* 22 24  GLUCOSE 423*  < > 163* 166* 170* 210* 173*  BUN 15  < > 12 10 10 7 6   CREATININE 1.10*  < > 0.64 0.63 0.58 0.68 0.59  CALCIUM 8.8*  < > 8.2* 8.1* 7.9* 7.8* 8.1*  MG 1.4*  --  1.2*  --  2.3 2.0  --   PHOS 3.1  --  <1.0*  --  1.7* 3.6  --   < > = values in this interval not displayed. Liver Function Tests:  Recent Labs Lab 07/01/15 0830  AST 16  ALT 20  ALKPHOS 93  BILITOT 1.6*  PROT 7.0  ALBUMIN 3.2*    Recent Labs Lab 07/01/15 0830  LIPASE 42   CBC:  Recent Labs Lab 07/01/15 0830 07/01/15 1151 07/02/15 0447 07/03/15 0543  WBC 15.6* 16.8* 10.6 9.6  HGB 12.4 11.8* 10.0* 9.9*  HCT 38.7  36.8 29.4* 29.6*  MCV 90.4 91.7 86.6 87.7  PLT 422 382 324 306   Cardiac Enzymes:  Recent Labs Lab 07/01/15 0830  TROPONINI <0.03    CBG:  Recent Labs Lab 07/03/15 1116 07/03/15 1711 07/03/15 2048 07/04/15 0848 07/04/15 1214  GLUCAP 191* 170* 164* 192* 203*    Recent Results (from the past 240 hour(s))  Urine culture     Status: Abnormal   Collection Time: 07/01/15  8:30 AM  Result Value Ref Range Status   Specimen Description URINE, CATHETERIZED  Final   Special Requests NONE  Final   Culture (A)  Final    <10,000 COLONIES/mL INSIGNIFICANT GROWTH Performed at [facility]    Report Status 07/02/2015 FINAL  Final  MRSA PCR Screening     Status: None   Collection Time: 07/01/15  1:38 PM  Result Value Ref Range Status   MRSA by PCR NEGATIVE NEGATIVE Final    Comment:        The GeneXpert MRSA Assay (FDA approved  for NASAL specimens only), is one component of a comprehensive MRSA colonization surveillance program. It is not intended to diagnose MRSA infection nor to guide or monitor treatment for MRSA infections.   Urine culture     Status: Abnormal   Collection Time: 07/02/15  1:37 AM  Result Value Ref Range Status   Specimen Description URINE, CATHETERIZED  Final   Special Requests NONE  Final   Culture MULTIPLE SPECIES PRESENT, SUGGEST RECOLLECTION (A)  Final   Report Status 07/03/2015 FINAL  Final  C difficile quick scan w PCR reflex     Status: None   Collection Time: 07/02/15  4:00 PM  Result Value Ref Range Status   C Diff antigen NEGATIVE NEGATIVE Final   C Diff toxin NEGATIVE NEGATIVE Final   C Diff interpretation Negative for C. difficile  Final      Scheduled Meds: . amLODipine  5 mg Oral Daily  . ammonium lactate   Topical BID  . enoxaparin (LOVENOX) injection  40 mg Subcutaneous Q12H  . insulin aspart  0-15 Units Subcutaneous TID WC  . insulin aspart  0-5 Units Subcutaneous QHS  . insulin aspart  4 Units Subcutaneous TID WC  . insulin glargine  34 Units Subcutaneous Daily  . lisinopril  40 mg Oral Daily  . metoCLOPramide  10 mg Oral TID AC & HS  . metoprolol succinate  100 mg Oral Daily  . pneumococcal 23 valent vaccine  0.5 mL Intramuscular Tomorrow-1000  . predniSONE  5 mg Oral Q breakfast  . sodium chloride flush  10-40 mL Intracatheter Q12H    Assessment/Plan:  1. Diabetic ketoacidosis. This has resolved. Continue sliding scale and Lantus insulin. Noncompliance with medications. 2. Nausea and poor appetite. Could be diabetic gastroparesis. Improved with IV Reglan started yesterday. Will switch over to oral Reglan today. 3. Weakness physical therapy recommended rehabilitation 4. Essential hypertension continue lisinopril and metoprolol and amlodipine 5. Depression. Daughter requesting psychiatry consultation. Patient has not taken her medications in 10 days prior  to admission 6. History of bullous pemphigus. Restart low-dose prednisone 5 mg daily. Has not been on prednisone since being here. May have had a withdrawal from being off the prednisone for a few days.  Code Status:     Code Status Orders        Start     Ordered   07/01/15 1052  Do not attempt resuscitation (DNR)   Continuous    Question Answer  Comment  In the event of cardiac or respiratory ARREST Do not call a "code blue"   In the event of cardiac or respiratory ARREST Do not perform Intubation, CPR, defibrillation or ACLS   In the event of cardiac or respiratory ARREST Use medication by any route, position, wound care, and other measures to relive pain and suffering. May use oxygen, suction and manual treatment of airway obstruction as needed for comfort.      07/01/15 1051    Code Status History    Date Active Date Inactive Code Status Order ID Comments User Context   07/01/2015 10:31 AM 07/01/2015 10:51 AM Full Code [id number]  Erin Fulling, MD ED   02/22/2013  4:38 AM 02/23/2013  4:50 PM Full Code [id number]  Dorothea Ogle, MD Inpatient     Family Communication: Daughter Yesterday at the bedside. Left message for her today. Disposition Plan: Out to rehabilitation potentially tomorrow  Time spent:  Alford Highland  [employer]

## 2015-07-04 NOTE — Care Management Important Message (Signed)
Important Message  Patient Details  Name: Sylvia Black MRN: [id number] Date of Birth: 05/29/1945   Medicare Important Message Given:  Yes    Olegario MessierKathy A Elane Peabody 07/04/2015, 12:11 PM

## 2015-07-05 ENCOUNTER — Inpatient Hospital Stay: Payer: Medicare HMO

## 2015-07-05 ENCOUNTER — Inpatient Hospital Stay (HOSPITAL_COMMUNITY)
Admit: 2015-07-05 | Discharge: 2015-07-05 | Disposition: A | Discharge status: Home / Self Care | Payer: Medicare HMO | Attending: Internal Medicine | Admitting: Internal Medicine

## 2015-07-05 DIAGNOSIS — I6350 Cerebral infarction due to unspecified occlusion or stenosis of unspecified cerebral artery: Secondary | ICD-10-CM

## 2015-07-05 LAB — BASIC METABOLIC PANEL
Anion gap: 6 (ref 5–15)
BUN: 6 mg/dL (ref 6–20)
CO2: 25 mmol/L (ref 22–32)
Calcium: 8.1 mg/dL — ABNORMAL LOW (ref 8.9–10.3)
Chloride: 108 mmol/L (ref 101–111)
Creatinine, Ser: 0.58 mg/dL (ref 0.44–1.00)
GFR calc Af Amer: >60 mL/min (ref >60)
GFR calc non Af Amer: >60 mL/min (ref >60)
Glucose, Bld: 102 mg/dL — ABNORMAL HIGH (ref 65–99)
Potassium: 3.2 mmol/L — ABNORMAL LOW (ref 3.5–5.1)
Sodium: 139 mmol/L (ref 135–145)

## 2015-07-05 LAB — ECHOCARDIOGRAM COMPLETE
Height: 63 in
Weight: 3643.2 oz

## 2015-07-05 LAB — GLUCOSE, CAPILLARY
GLUCOSE-CAPILLARY: 140 mg/dL — AB (ref 65–99)
Glucose-Capillary: 123 mg/dL — ABNORMAL HIGH (ref 65–99)
Glucose-Capillary: 177 mg/dL — ABNORMAL HIGH (ref 65–99)
Glucose-Capillary: 151 mg/dL — ABNORMAL HIGH (ref 65–99)
Glucose-Capillary: 109 mg/dL — ABNORMAL HIGH (ref 65–99)

## 2015-07-05 MED ORDER — INSULIN GLARGINE 100 UNIT/ML ~~LOC~~ SOLN
30.0000 [IU] | Freq: Every day | SUBCUTANEOUS | Status: DC
  Administered 2015-07-05 – 2015-07-07 (×3): 30 [IU] via SUBCUTANEOUS
  Filled 2015-07-05 (×4): qty 0.3

## 2015-07-05 MED ORDER — PRAVASTATIN SODIUM 20 MG PO TABS: 20.0000 mg | ORAL_TABLET | Freq: Every day | ORAL | Status: DC

## 2015-07-05 MED ORDER — ATORVASTATIN CALCIUM 20 MG PO TABS
40.0000 mg | ORAL_TABLET | Freq: Every day | ORAL | Status: DC
  Administered 2015-07-05 – 2015-07-07 (×3): 40 mg via ORAL
  Filled 2015-07-05 (×4): qty 2

## 2015-07-05 MED ORDER — LORAZEPAM 2 MG/ML IJ SOLN
0.5000 mg | Freq: Once | INTRAMUSCULAR | Status: AC
  Administered 2015-07-06: 0.5 mg via INTRAVENOUS
  Filled 2015-07-05: qty 1

## 2015-07-05 MED ORDER — CLOPIDOGREL BISULFATE 75 MG PO TABS
75.0000 mg | ORAL_TABLET | Freq: Every day | ORAL | Status: DC
  Administered 2015-07-05 – 2015-07-07 (×3): 75 mg via ORAL
  Filled 2015-07-05 (×3): qty 1

## 2015-07-05 MED ORDER — POTASSIUM CHLORIDE CRYS ER 20 MEQ PO TBCR
40.0000 meq | EXTENDED_RELEASE_TABLET | Freq: Once | ORAL | Status: AC
  Administered 2015-07-05: 08:00:00 40 meq via ORAL
  Filled 2015-07-05: qty 2

## 2015-07-05 NOTE — Progress Notes (Signed)
Patient ID: Sylvia Black Stecher, female   DOB: 04/09/1945, 70 y.o.   MRN: [id number] [facility] PROGRESS NOTE  Sylvia Black Mac [id number] DOB: 02/14/1945 DOA: 07/01/2015 PCP: Emogene MorganAYCOCK, NGWE A, MD  HPI/Subjective: Patient states that she is has slurred speech this morning. Then she states she's had it for 4 days. I am noticing it more today than I did previously.  Objective: Filed Vitals:   07/05/15 0952 07/05/15 1500  BP: 114/55 143/58  Pulse: 94 83  Temp: 98.1 F (36.7 C) 98 F (36.7 C)  Resp: 17 20    Filed Weights   07/03/15 1255 07/04/15 0500 07/05/15 0500  Weight: 107.457 kg (236 lb 14.4 oz) 102.876 kg (226 lb 12.8 oz) 103.284 kg (227 lb 11.2 oz)    ROS: Review of Systems  Constitutional: Negative for fever and chills.  Eyes: Negative for blurred vision.  Respiratory: Negative for cough and shortness of breath.   Cardiovascular: Negative for chest pain.  Gastrointestinal: Negative for nausea, vomiting, abdominal pain, diarrhea and constipation.  Genitourinary: Negative for dysuria.  Musculoskeletal: Negative for joint pain.  Neurological: Positive for speech change. Negative for dizziness and headaches.   Exam: Physical Exam  HENT:  Nose: No mucosal edema.  Mouth/Throat: No oropharyngeal exudate or posterior oropharyngeal edema.  Eyes: Conjunctivae, EOM and lids are normal. Pupils are equal, round, and reactive to light.  Neck: No JVD present. Carotid bruit is not present. No edema present. No thyroid mass and no thyromegaly present.  Cardiovascular: S1 normal and S2 normal.  Exam reveals no gallop.   No murmur heard. Pulses:      Dorsalis pedis pulses are 2+ on the right side, and 2+ on the left side.  Respiratory: No respiratory distress. She has no wheezes. She has no rhonchi. She has no rales.  GI: Soft. Bowel sounds are normal. There is no tenderness.  Musculoskeletal:       Right ankle: She exhibits swelling.       Left ankle: She exhibits swelling.   Lymphadenopathy:    She has no cervical adenopathy.  Neurological: She is alert.  Slurred speech today.  Skin: Skin is warm. Nails show no clubbing.  Scaling on the feet  Psychiatric: She has a normal mood and affect.      Data Reviewed: Basic Metabolic Panel:  Recent Labs Lab 07/01/15 1151  07/02/15 0447 07/02/15 1015 07/02/15 1500 07/03/15 0543 07/04/15 0539 07/05/15 0506  NA 136  < > 141 138 137 135 139 139  K 3.7  < > 2.8* 4.0 4.1 3.9 3.6 3.2*  CL 111  < > 114* 109 108 107 109 108  CO2 <7*  < > 20* 20* 21* 22 24 25   GLUCOSE 423*  < > 163* 166* 170* 210* 173* 102*  BUN 15  < > 12 10 10 7 6 6   CREATININE 1.10*  < > 0.64 0.63 0.58 0.68 0.59 0.58  CALCIUM 8.8*  < > 8.2* 8.1* 7.9* 7.8* 8.1* 8.1*  MG 1.4*  --  1.2*  --  2.3 2.0  --   --   PHOS 3.1  --  <1.0*  --  1.7* 3.6  --   --   < > = values in this interval not displayed. Liver Function Tests:  Recent Labs Lab 07/01/15 0830  AST 16  ALT 20  ALKPHOS 93  BILITOT 1.6*  PROT 7.0  ALBUMIN 3.2*    Recent Labs Lab 07/01/15 0830  LIPASE 42  CBC:  Recent Labs Lab 07/01/15 0830 07/01/15 1151 07/02/15 0447 07/03/15 0543  WBC 15.6* 16.8* 10.6 9.6  HGB 12.4 11.8* 10.0* 9.9*  HCT 38.7 36.8 29.4* 29.6*  MCV 90.4 91.7 86.6 87.7  PLT 422 382 324 306   Cardiac Enzymes:  Recent Labs Lab 07/01/15 0830  TROPONINI <0.03    CBG:  Recent Labs Lab 07/04/15 1648 07/04/15 2128 07/05/15 0722 07/05/15 1018 07/05/15 1210  GLUCAP 125* 94 123* 177* 140*    Recent Results (from the past 240 hour(s))  Urine culture     Status: Abnormal   Collection Time: 07/01/15  8:30 AM  Result Value Ref Range Status   Specimen Description URINE, CATHETERIZED  Final   Special Requests NONE  Final   Culture (A)  Final    <10,000 COLONIES/mL INSIGNIFICANT GROWTH Performed at [facility]    Report Status 07/02/2015 FINAL  Final  MRSA PCR Screening     Status: None   Collection Time: 07/01/15  1:38 PM   Result Value Ref Range Status   MRSA by PCR NEGATIVE NEGATIVE Final    Comment:        The GeneXpert MRSA Assay (FDA approved for NASAL specimens only), is one component of a comprehensive MRSA colonization surveillance program. It is not intended to diagnose MRSA infection nor to guide or monitor treatment for MRSA infections.   Urine culture     Status: Abnormal   Collection Time: 07/02/15  1:37 AM  Result Value Ref Range Status   Specimen Description URINE, CATHETERIZED  Final   Special Requests NONE  Final   Culture MULTIPLE SPECIES PRESENT, SUGGEST RECOLLECTION (A)  Final   Report Status 07/03/2015 FINAL  Final  C difficile quick scan w PCR reflex     Status: None   Collection Time: 07/02/15  4:00 PM  Result Value Ref Range Status   C Diff antigen NEGATIVE NEGATIVE Final   C Diff toxin NEGATIVE NEGATIVE Final   C Diff interpretation Negative for C. difficile  Final      Scheduled Meds: . amLODipine  5 mg Oral Daily  . ammonium lactate   Topical BID  . atorvastatin  40 mg Oral q1800  . buPROPion  300 mg Oral Daily  . clopidogrel  75 mg Oral Daily  . enoxaparin (LOVENOX) injection  40 mg Subcutaneous Q12H  . insulin aspart  0-15 Units Subcutaneous TID WC  . insulin aspart  0-5 Units Subcutaneous QHS  . insulin aspart  4 Units Subcutaneous TID WC  . insulin glargine  30 Units Subcutaneous Daily  . lisinopril  40 mg Oral Daily  . LORazepam  0.5 mg Intravenous Once  . metoCLOPramide  10 mg Oral TID AC & HS  . metoprolol succinate  100 mg Oral Daily  . pneumococcal 23 valent vaccine  0.5 mL Intramuscular Tomorrow-1000  . predniSONE  5 mg Oral Q breakfast  . sodium chloride flush  10-40 mL Intracatheter Q12H    Assessment/Plan:  1. Slurred speech. I ordered a CT scan that showed a possible subacute stroke. I ordered her Plavix and high dose Lipitor. I will get an MRI of the brain and carotid ultrasound and echocardiogram  2. Diabetic ketoacidosis. This has  resolved. Continue sliding scale and Lantus insulin. Noncompliance with medications at home 3. Nausea and poor appetite. Could be diabetic gastroparesis. Switched over to oral Reglan today. 4. Weakness physical therapy recommended rehabilitation 5. Essential hypertension continue lisinopril and metoprolol and amlodipine 6.  Depression. Appreciate psych consultation. On increased dose of Wellbutrin. 7. History of bullous pemphigus. Restart low-dose prednisone 5 mg daily. Has not been on prednisone since being here. May have had a withdrawal from being off the prednisone for a few days. 8. Hypokalemia replace potassium today.  Code Status:     Code Status Orders        Start     Ordered   07/01/15 1052  Do not attempt resuscitation (DNR)   Continuous    Question Answer Comment  In the event of cardiac or respiratory ARREST Do not call a "code blue"   In the event of cardiac or respiratory ARREST Do not perform Intubation, CPR, defibrillation or ACLS   In the event of cardiac or respiratory ARREST Use medication by any route, position, wound care, and other measures to relive pain and suffering. May use oxygen, suction and manual treatment of airway obstruction as needed for comfort.      07/01/15 1051    Code Status History    Date Active Date Inactive Code Status Order ID Comments User Context   07/01/2015 10:31 AM 07/01/2015 10:51 AM Full Code [id number]  Erin Fulling, MD ED   02/22/2013  4:38 AM 02/23/2013  4:50 PM Full Code [id number]  Dorothea Ogle, MD Inpatient     Family Communication: Left message for Her daughter today. Disposition Plan: Out to rehab soon. Stroke workup underway.  Time spent:  Alford Highland  [employer]

## 2015-07-05 NOTE — Progress Notes (Signed)
*  PRELIMINARY RESULTS* Echocardiogram 2D Echocardiogram has been performed.  Georgann HousekeeperJerry R Hege 07/05/2015, 2:19 PM

## 2015-07-05 NOTE — Consult Note (Signed)
[facility] Consult   Reason for Consult:  Consult for 70 year old woman with multiple medical problems and history of depression Referring Physician:  [place] Patient Identification: Sylvia Black MRN:  [id number] Principal Diagnosis: Major depression moderate recurrent Diagnosis:   Patient Active Problem List   Diagnosis Date Noted  . Depression [F32.9] 07/04/2015  . Altered mental status [R41.82]   . Diabetic ketoacidosis without coma associated with type 2 diabetes mellitus ([place]) [E13.10]   . Encounter for central line placement [Z45.2]   . Acidosis [E87.2] 07/01/2015  . Stroke [facility]) [I63.9]   . Hypertension [I10]   . Frequent UTI [N39.0]   . Diabetes mellitus without complication ([place]) [N82.9]   . Hypoglycemia [E16.2] 02/21/2013    Total Time spent with patient: 1 hour  Subjective:   Sylvia Black is a 70 y.o. female patient admitted with "I haven't been doing well for a while".  Follow-up for this 70 year old woman with multiple medical problems. Patient seen. Chart reviewed. Patient says she is feeling much better today. She is more optimistic and upbeat. Denies suicidal thoughts. Affect is bright and appropriate. Patient is now back on the Wellbutrin as she had been in the past.  HPI:  Patient interviewed. Chart reviewed. Labs and vitals reviewed. Patient's daughter apparently had requested a psychiatric consult. Patient came into the hospital with diabetic ketoacidosis. There was a note in the chart that she had been not taking her medicine for about 2 days. On interview today the patient is aware that she will came in because of problems with her blood sugar. She said that the only reason she had not been taking her medicine was because she was throwing up constantly for several days. She's been feeling nauseous for about a week. Been feeling tired. Poor sleep. As far as her mood she says that most of her mood recently has been angry. Not to a severe degree but  angry at herself and irritated that her health is getting in the way of her life. She says she does feel depressed but denies feeling hopeless. Has some problems with sleep some appetite problems but a lot of medical problems. She denies having any suicidal ideation. Denies any wish to die. Indicates that she has positive things that she wants to get better for although she currently has little enjoyment in life. She says that up until the time that she got sick a few days ago she had been taking her Wellbutrin which is prescribed by her pain clinic although she thinks of that is more of a pain medicine.  Social history: Patient lives with her daughter and grandson. Her husband died 5 years ago. It's been hard for her ever since he died. Has not been functioning well and her function is gotten worse over the last several months. She think she probably hasn't gotten out of bed in 6 months.  Medical history: Obesity, diabetes, history of strokes and high blood pressure  Substance abuse history: Denies any history of alcohol or drug abuse.  Past Psychiatric History: Patient indicates that she has had psychiatric admissions before but was about 15 years ago. She denies that she's ever tried to kill her self. She cannot remember any antidepressant she has been prescribed besides Wellbutrin.  Risk to Self: Is patient at risk for suicide?: No Risk to Others:   Prior Inpatient Therapy:   Prior Outpatient Therapy:    Past Medical History:  Past Medical History  Diagnosis Date  . Hypertension   .  Frequent UTI   . PONV (postoperative nausea and vomiting)     "makes me violently ill" (02/22/2013)  . Family history of anesthesia complication     "daughter's w/PONV" (02/22/2013)  . High cholesterol   . Type II diabetes mellitus ([place])   . Pneumonia 1990's    "once"  . Sleep apnea     "dx'd in the 1990's; don't wear mask" (02/22/2013)  . GERD (gastroesophageal reflux disease)   . Stroke [facility]) 2010; 02/2012     denies residual; "balance issues since & weak on left side since" (02/22/2013)  . 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 Gout attack     "twice" (02/22/2013)  . Anxiety   . Depression   . Bipolar disorder ([place])   . Vertigo     Past Surgical History  Procedure Laterality Date  . Posterior fusion cervical spine  ~ 1988  . Anterior cervical decomp/discectomy fusion  1989  . Cholecystectomy  1984  . Appendectomy  1984  . Tubal ligation  1973  . Wrist surgery Right ~ 1985    "tied off damagaed nerve" (02/22/2013)  . Cataract extraction w/ intraocular lens  implant, bilateral Bilateral ~ 2008   Family History: No family history on file. Family Psychiatric  History: She indicates that there are a lot of emotional problems and several members of her family. Social History:  History  Alcohol Use  . Yes    Comment: 02/22/2013 "mixed drink on special occasions; glass of wine q 2-3 months"     History  Drug Use No    Social History   Social History  . Marital Status: Widowed    Spouse Name: N/A  . Number of Children: N/A  . Years of Education: N/A   Social History Main Topics  . Smoking status: Passive Smoke Exposure - Never Smoker  . Smokeless tobacco: Never Used  . Alcohol Use: Yes     Comment: 02/22/2013 "mixed drink on special occasions; glass of wine q 2-3 months"  . Drug Use: No  . Sexual Activity: No   Other Topics Concern  . None   Social History Narrative   Additional Social History:    Allergies:   Allergies  Allergen Reactions  . Ciprofloxacin     Other reaction(s): Hallucinations  . Penicillins Hives    Has patient had a PCN reaction causing immediate rash, facial/tongue/throat swelling, SOB or lightheadedness with hypotension: Yes Has patient had a PCN reaction causing severe rash involving mucus membranes or skin necrosis: No Has patient had a PCN reaction that required hospitalization Yes Has patient had a PCN reaction  occurring within the last 10 years: No If all of the above answers are "NO", then may proceed with Cephalosporin use.  . Sulfa Antibiotics     "hives"  . Tetanus Toxoids     "I had a strong reaction".    Labs:  Results for orders placed or performed during the hospital encounter of 07/01/15 (from the past 48 hour(s))  Glucose, capillary     Status: Abnormal   Collection Time: 07/03/15  8:48 PM  Result Value Ref Range   Glucose-Capillary 164 (H) 65 - 99 mg/dL  Basic metabolic panel     Status: Abnormal   Collection Time: 07/04/15  5:39 AM  Result Value Ref Range   Sodium 139 135 - 145 mmol/L   Potassium 3.6 3.5 - 5.1 mmol/L   Chloride 109 101 -  111 mmol/L   CO2 24 22 - 32 mmol/L   Glucose, Bld 173 (H) 65 - 99 mg/dL   BUN 6 6 - 20 mg/dL   Creatinine, Ser 0.59 0.44 - 1.00 mg/dL   Calcium 8.1 (L) 8.9 - 10.3 mg/dL   GFR calc non Af Amer >60 >60 mL/min   GFR calc Af Amer >60 >60 mL/min    Comment: (NOTE) The eGFR has been calculated using the CKD EPI equation. This calculation has not been validated in all clinical situations. eGFR's persistently <60 mL/min signify possible Chronic Kidney Disease.    Anion gap 6 5 - 15  Glucose, capillary     Status: Abnormal   Collection Time: 07/04/15  8:48 AM  Result Value Ref Range   Glucose-Capillary 192 (H) 65 - 99 mg/dL  Glucose, capillary     Status: Abnormal   Collection Time: 07/04/15 12:14 PM  Result Value Ref Range   Glucose-Capillary 203 (H) 65 - 99 mg/dL  Glucose, capillary     Status: Abnormal   Collection Time: 07/04/15  4:48 PM  Result Value Ref Range   Glucose-Capillary 125 (H) 65 - 99 mg/dL  Glucose, capillary     Status: None   Collection Time: 07/04/15  9:28 PM  Result Value Ref Range   Glucose-Capillary 94 65 - 99 mg/dL  Basic metabolic panel     Status: Abnormal   Collection Time: 07/05/15  5:06 AM  Result Value Ref Range   Sodium 139 135 - 145 mmol/L   Potassium 3.2 (L) 3.5 - 5.1 mmol/L   Chloride 108 101 - 111  mmol/L   CO2 25 22 - 32 mmol/L   Glucose, Bld 102 (H) 65 - 99 mg/dL   BUN 6 6 - 20 mg/dL   Creatinine, Ser 0.58 0.44 - 1.00 mg/dL   Calcium 8.1 (L) 8.9 - 10.3 mg/dL   GFR calc non Af Amer >60 >60 mL/min   GFR calc Af Amer >60 >60 mL/min    Comment: (NOTE) The eGFR has been calculated using the CKD EPI equation. This calculation has not been validated in all clinical situations. eGFR's persistently <60 mL/min signify possible Chronic Kidney Disease.    Anion gap 6 5 - 15  Glucose, capillary     Status: Abnormal   Collection Time: 07/05/15  7:22 AM  Result Value Ref Range   Glucose-Capillary 123 (H) 65 - 99 mg/dL  Glucose, capillary     Status: Abnormal   Collection Time: 07/05/15 10:18 AM  Result Value Ref Range   Glucose-Capillary 177 (H) 65 - 99 mg/dL  Glucose, capillary     Status: Abnormal   Collection Time: 07/05/15 12:10 PM  Result Value Ref Range   Glucose-Capillary 140 (H) 65 - 99 mg/dL  Glucose, capillary     Status: Abnormal   Collection Time: 07/05/15  4:18 PM  Result Value Ref Range   Glucose-Capillary 151 (H) 65 - 99 mg/dL    Current Facility-Administered Medications  Medication Dose Route Frequency Provider Last Rate Last Dose  . 0.9 %  sodium chloride infusion  250 mL Intravenous PRN Flora Lipps, MD      . acetaminophen (TYLENOL) tablet 650 mg  650 mg Oral Q4H PRN Flora Lipps, MD      . amLODipine (NORVASC) tablet 5 mg  5 mg Oral Daily Anders Simmonds, MD   5 mg at 07/05/15 1024  . ammonium lactate (LAC-HYDRIN) 12 % lotion   Topical BID Loletha Grayer,  MD      . atorvastatin (LIPITOR) tablet 40 mg  40 mg Oral q1800 Loletha Grayer, MD   40 mg at 07/05/15 1707  . buPROPion (WELLBUTRIN XL) 24 hr tablet 300 mg  300 mg Oral Daily Gonzella Lex, MD   300 mg at 07/05/15 1024  . clopidogrel (PLAVIX) tablet 75 mg  75 mg Oral Daily Loletha Grayer, MD   75 mg at 07/05/15 1024  . enoxaparin (LOVENOX) injection 40 mg  40 mg Subcutaneous Q12H Flora Lipps, MD   40 mg at  07/05/15 1025  . insulin aspart (novoLOG) injection 0-15 Units  0-15 Units Subcutaneous TID WC Wilhelmina Mcardle, MD   3 Units at 07/05/15 1705  . insulin aspart (novoLOG) injection 0-5 Units  0-5 Units Subcutaneous QHS Wilhelmina Mcardle, MD   0 Units at 07/03/15 2249  . insulin aspart (novoLOG) injection 4 Units  4 Units Subcutaneous TID WC Wilhelmina Mcardle, MD   4 Units at 07/05/15 1703  . insulin glargine (LANTUS) injection 30 Units  30 Units Subcutaneous Daily Loletha Grayer, MD   30 Units at 07/05/15 1025  . lisinopril (PRINIVIL,ZESTRIL) tablet 40 mg  40 mg Oral Daily Flora Lipps, MD   40 mg at 07/05/15 1022  . LORazepam (ATIVAN) injection 0.5 mg  0.5 mg Intravenous Once Loletha Grayer, MD      . metoCLOPramide (REGLAN) tablet 10 mg  10 mg Oral TID AC & HS Loletha Grayer, MD   10 mg at 07/05/15 1702  . metoprolol succinate (TOPROL-XL) 24 hr tablet 100 mg  100 mg Oral Daily Flora Lipps, MD   100 mg at 07/05/15 1024  . ondansetron (ZOFRAN) injection 4 mg  4 mg Intravenous Q6H PRN Flora Lipps, MD   4 mg at 07/03/15 1315  . pneumococcal 23 valent vaccine (PNU-IMMUNE) injection 0.5 mL  0.5 mL Intramuscular Tomorrow-1000 Flora Lipps, MD   0.5 mL at 07/03/15 1753  . predniSONE (DELTASONE) tablet 5 mg  5 mg Oral Q breakfast Loletha Grayer, MD   5 mg at 07/05/15 0823  . sodium chloride flush (NS) 0.9 % injection 10-40 mL  10-40 mL Intracatheter Q12H Mikael Spray, NP   30 mL at 07/05/15 1023  . sodium chloride flush (NS) 0.9 % injection 10-40 mL  10-40 mL Intracatheter PRN Mikael Spray, NP        Musculoskeletal: Strength & Muscle Tone: decreased Gait & Station: unable to stand Patient leans: N/A  Psychiatric Specialty Exam: Physical Exam  Nursing note and vitals reviewed. Constitutional: She appears well-developed and well-nourished.  HENT:  Head: Normocephalic and atraumatic.  Eyes: Conjunctivae are normal. Pupils are equal, round, and reactive to light.  Neck: Normal range of  motion.  Cardiovascular: Regular rhythm and normal heart sounds.   Respiratory: Effort normal. No respiratory distress.  GI: Soft.  Musculoskeletal: Normal range of motion.  Neurological: She is alert.  Skin: Skin is warm and dry.  Psychiatric: She has a normal mood and affect. Her speech is normal and behavior is normal. Judgment and thought content normal. Cognition and memory are normal.    Review of Systems  Constitutional: Negative.   HENT: Negative.   Eyes: Negative.   Respiratory: Negative.   Cardiovascular: Negative.   Gastrointestinal: Negative.   Musculoskeletal: Negative.   Skin: Negative.   Neurological: Negative.   Psychiatric/Behavioral: Positive for depression. Negative for suicidal ideas, hallucinations, memory loss and substance abuse. The patient is not nervous/anxious and does not  have insomnia.     Blood pressure 143/58, pulse 83, temperature 98 F (36.7 C), temperature source Oral, resp. rate 20, height _0  (1.6 m), weight 103.284 kg (227 lb 11.2 oz), SpO2 98 %.Body mass index is 40.35 kg/(m^2).  General Appearance: Casual  Eye Contact:  Good  Speech:  Normal Rate  Volume:  Normal  Mood:  Dysphoric  Affect:  Appropriate  Thought Process:  Goal Directed  Orientation:  Full (Time, Place, and Person)  Thought Content:  Logical  Suicidal Thoughts:  No  Homicidal Thoughts:  No  Memory:  Immediate;   Good Recent;   Poor Remote;   Fair  Judgement:  Fair  Insight:  Fair  Psychomotor Activity:  Decreased  Concentration:  Concentration: Fair  Recall:  [employer] of Knowledge:  Fair  Language:  Fair  Akathisia:  No  Handed:  Right  AIMS (if indicated):     Assets:  Communication Skills Desire for Improvement Housing Resilience Social Support  ADL's:  Impaired  Cognition:  WNL  Sleep:        Treatment Plan Summary: Plan No change to medicine. Supportive counseling. Review of the importance of getting outpatient psychiatric treatment. Patient will  continue on current medication. Follow-up as needed.  Disposition: Patient does not meet criteria for psychiatric inpatient admission. Supportive therapy provided about ongoing stressors.  Alethia Berthold, MD 07/05/2015 6:27 PM

## 2015-07-06 ENCOUNTER — Inpatient Hospital Stay: Payer: Medicare HMO

## 2015-07-06 LAB — LIPID PANEL
CHOLESTEROL: 137 mg/dL (ref 0–200)
HDL: 30 mg/dL — AB (ref >40)
LDL Cholesterol: 73 mg/dL (ref 0–99)
TRIGLYCERIDES: 168 mg/dL — AB (ref <150)
Total CHOL/HDL Ratio: 4.6 RATIO
VLDL: 34 mg/dL (ref 0–40)

## 2015-07-06 LAB — BASIC METABOLIC PANEL
ANION GAP: 9 (ref 5–15)
BUN: 8 mg/dL (ref 6–20)
CHLORIDE: 106 mmol/L (ref 101–111)
CO2: 23 mmol/L (ref 22–32)
Calcium: 8 mg/dL — ABNORMAL LOW (ref 8.9–10.3)
Creatinine, Ser: 0.47 mg/dL (ref 0.44–1.00)
GFR calc Af Amer: >60 mL/min (ref >60)
GFR calc non Af Amer: >60 mL/min (ref >60)
GLUCOSE: 140 mg/dL — AB (ref 65–99)
POTASSIUM: 3.9 mmol/L (ref 3.5–5.1)
Sodium: 138 mmol/L (ref 135–145)

## 2015-07-06 LAB — GLUCOSE, CAPILLARY
GLUCOSE-CAPILLARY: 153 mg/dL — AB (ref 65–99)
Glucose-Capillary: 153 mg/dL — ABNORMAL HIGH (ref 65–99)
Glucose-Capillary: 124 mg/dL — ABNORMAL HIGH (ref 65–99)
Glucose-Capillary: 120 mg/dL — ABNORMAL HIGH (ref 65–99)

## 2015-07-06 NOTE — Care Management Important Message (Signed)
Important Message  Patient Details  Name: Sylvia Black MRN: [id number] Date of Birth: 06/04/1945   Medicare Important Message Given:  Yes    Olegario MessierKathy A Zuriyah Shatz 07/06/2015, 12:42 PM

## 2015-07-06 NOTE — Progress Notes (Signed)
Physical Therapy Treatment Patient Details Name: Sylvia Black MRN: [id number] DOB: 11/23/1945 Today's Date: 07/06/2015    History of Present Illness 70 y/o female here with acidosis.  She has h/o CVA, she has been lethargic with AMS - difficult to assess deifference from baseline.     PT Comments    Pt declined attempt this am but agreed with pm offer stating her legs were sore and she wanted to move them.  Pt participated in supine exercises as described below to increase comfort, rom and strength.  She put less effort into exercises today compared to last session needing AAROM today on RLE and less active movement on LLE resulting on increased dependence on writer for movement of both le's.  She was less verbal today but daughter was in room and was talking a lot over patient during session.  Stated she is awaiting one more test this afternoon and [facility] to [facility].   Follow Up Recommendations  SNF     Equipment Recommendations       Recommendations for Other Services       Precautions / Restrictions Precautions Precautions: Fall Restrictions Weight Bearing Restrictions: No    Mobility  Bed Mobility                  Transfers                    Ambulation/Gait                 Stairs            Wheelchair Mobility    Modified Rankin (Stroke Patients Only)       Balance                                    Cognition Arousal/Alertness: Awake/alert Behavior During Therapy: WFL for tasks assessed/performed Overall Cognitive Status: Within Functional Limits for tasks assessed                      Exercises General Exercises - Lower Extremity Ankle Circles/Pumps: AAROM;Both;20 reps;Supine Heel Slides: AAROM;Both;20 reps;Supine Hip ABduction/ADduction: AAROM;Both;20 reps;Supine Straight Leg Raises: AAROM;Both;20 reps;Supine    General Comments        Pertinent  Vitals/Pain Pain Assessment: No/denies pain    Home Living                      Prior Function            PT Goals (current goals can now be found in the care plan section)      Frequency  Min 2X/week    PT Plan Current plan remains appropriate    Co-evaluation             End of Session   Activity Tolerance: Patient tolerated treatment well Patient left: in bed;with call bell/phone within reach;with bed alarm set;with nursing/sitter in room     Time: 1421-1435 PT Time Calculation (min) (ACUTE ONLY): 14 min  Charges:  $Therapeutic Exercise: 8-22 mins                    G Codes:      Danielle DessSarah Myreon Wimer, PTA 07/06/2015, 2:54 PM

## 2015-07-06 NOTE — Clinical Social Work Note (Signed)
CSW presented bed offers. Pt and daughter's primary choice did not have a bed available. [facility] CSW updated facility and [facility]. CSW sent updated therapy notes. CSW will continue to follow.   Dede QuerySarah Eliceo Gladu, MSW, LCSW  Clinical Social Worker [phone]

## 2015-07-06 NOTE — Progress Notes (Signed)
Patient ID: Sylvia Black, female   DOB: [birth year], 70 y.o.   MRN: [id number] [facility] PROGRESS NOTE  Sylvia Black:[id number] DOB: [birth year] DOA: 07/01/2015 PCP: Emogene Morgan, MD  HPI/Subjective: Patient states her speech is better today and that she is ready to get out of the hospital. Unfortunately we are waiting for insurance authorization for rehabilitation.  Objective: Filed Vitals:   07/06/15 0534 07/06/15 1453  BP: 147/37 146/66  Pulse: 84 84  Temp: 97.8 F (36.6 C) 96.7 F (35.9 C)  Resp:  20    Filed Weights   07/04/15 0500 07/05/15 0500 07/06/15 0534  Weight: 102.876 kg (226 lb 12.8 oz) 103.284 kg (227 lb 11.2 oz) 103.42 kg (228 lb)    ROS: Review of Systems  Constitutional: Negative for fever and chills.  Eyes: Negative for blurred vision.  Respiratory: Negative for cough and shortness of breath.   Cardiovascular: Negative for chest pain.  Gastrointestinal: Negative for nausea, vomiting, abdominal pain, diarrhea and constipation.  Genitourinary: Negative for dysuria.  Musculoskeletal: Negative for joint pain.  Neurological: Positive for speech change. Negative for dizziness and headaches.   Exam: Physical Exam  HENT:  Nose: No mucosal edema.  Mouth/Throat: No oropharyngeal exudate or posterior oropharyngeal edema.  Eyes: Conjunctivae, EOM and lids are normal. Pupils are equal, round, and reactive to light.  Neck: No JVD present. Carotid bruit is not present. No edema present. No thyroid mass and no thyromegaly present.  Cardiovascular: S1 normal and S2 normal.  Exam reveals no gallop.   No murmur heard. Pulses:      Dorsalis pedis pulses are 2+ on the right side, and 2+ on the left side.  Respiratory: No respiratory distress. She has no wheezes. She has no rhonchi. She has no rales.  GI: Soft. Bowel sounds are normal. There is no tenderness.  Musculoskeletal:       Right ankle: She exhibits swelling.       Left ankle: She exhibits swelling.   Lymphadenopathy:    She has no cervical adenopathy.  Neurological: She is alert.  Slurred speech is less pronounced today.  Skin: Skin is warm. Nails show no clubbing.  Scaling on the feet  Psychiatric: She has a normal mood and affect.      Data Reviewed: Basic Metabolic Panel:  Recent Labs Lab 07/01/15 1151  07/02/15 0447  07/02/15 1500 07/03/15 0543 07/04/15 0539 07/05/15 0506 07/06/15 0505  NA 136  < > 141  < > 137 135 139 139 138  K 3.7  < > 2.8*  < > 4.1 3.9 3.6 3.2* 3.9  CL 111  < > 114*  < > 108 107 109 108 106  CO2 <7*  < > 20*  < > 21* GLUCOSE 423*  < > 163*  < > 170* 210* 173* 102* 140*  BUN 15  < > 12  < > CREATININE 1.10*  < > 0.64  < > 0.58 0.68 0.59 0.58 0.47  CALCIUM 8.8*  < > 8.2*  < > 7.9* 7.8* 8.1* 8.1* 8.0*  MG 1.4*  --  1.2*  --  2.3 2.0  --   --   --   PHOS 3.1  --  <1.0*  --  1.7* 3.6  --   --   --   < > = values in this interval not displayed. Liver Function Tests:  Recent Labs Lab 07/01/15 0830  AST 16  ALT 20  ALKPHOS 93  BILITOT 1.6*  PROT 7.0  ALBUMIN 3.2*    Recent Labs Lab 07/01/15 0830  LIPASE 42   CBC:  Recent Labs Lab 07/01/15 0830 07/01/15 1151 07/02/15 0447 07/03/15 0543  WBC 15.6* 16.8* 10.6 9.6  HGB 12.4 11.8* 10.0* 9.9*  HCT 38.7 36.8 29.4* 29.6*  MCV 90.4 91.7 86.6 87.7  PLT 422 382 324 306   Cardiac Enzymes:  Recent Labs Lab 07/01/15 0830  TROPONINI <0.03    CBG:  Recent Labs Lab 07/05/15 1210 07/05/15 1618 07/05/15 2139 07/06/15 0737 07/06/15 1244  GLUCAP 140* 151* 109* 153* 124*    Recent Results (from the past 240 hour(s))  Urine culture     Status: Abnormal   Collection Time: 07/01/15  8:30 AM  Result Value Ref Range Status   Specimen Description URINE, CATHETERIZED  Final   Special Requests NONE  Final   Culture (A)  Final    <10,000 COLONIES/mL INSIGNIFICANT GROWTH [facility]    Report Status 07/02/2015 FINAL  Final  MRSA PCR  Screening     Status: None   Collection Time: 07/01/15  1:38 PM  Result Value Ref Range Status   MRSA by PCR NEGATIVE NEGATIVE Final    Comment:        The GeneXpert MRSA Assay (FDA approved for NASAL specimens only), is one component of a comprehensive MRSA colonization surveillance program. It is not intended to diagnose MRSA infection nor to guide or monitor treatment for MRSA infections.   Urine culture     Status: Abnormal   Collection Time: 07/02/15  1:37 AM  Result Value Ref Range Status   Specimen Description URINE, CATHETERIZED  Final   Special Requests NONE  Final   Culture MULTIPLE SPECIES PRESENT, SUGGEST RECOLLECTION (A)  Final   Report Status 07/03/2015 FINAL  Final  C difficile quick scan w PCR reflex     Status: None   Collection Time: 07/02/15  4:00 PM  Result Value Ref Range Status   C Diff antigen NEGATIVE NEGATIVE Final   C Diff toxin NEGATIVE NEGATIVE Final   C Diff interpretation Negative for C. difficile  Final      Scheduled Meds: . amLODipine  5 mg Oral Daily  . ammonium lactate   Topical BID  . atorvastatin  40 mg Oral q1800  . buPROPion  300 mg Oral Daily  . clopidogrel  75 mg Oral Daily  . enoxaparin (LOVENOX) injection  40 mg Subcutaneous Q12H  . insulin aspart  0-15 Units Subcutaneous TID WC  . insulin aspart  0-5 Units Subcutaneous QHS  . insulin aspart  4 Units Subcutaneous TID WC  . insulin glargine  30 Units Subcutaneous Daily  . lisinopril  40 mg Oral Daily  . metoCLOPramide  10 mg Oral TID AC & HS  . metoprolol succinate  100 mg Oral Daily  . pneumococcal 23 valent vaccine  0.5 mL Intramuscular Tomorrow-1000  . predniSONE  5 mg Oral Q breakfast    Assessment/Plan:  1. Slurred speech.  I ordered her Plavix and high dose Lipitor. MRI does not show any acute infarct. Shows progressive small vessel ischemia. 2. Diabetic ketoacidosis. This has resolved. Continue sliding scale and Lantus insulin. Noncompliance with medications at  home 3. Nausea and poor appetite. Could be diabetic gastroparesis. Switched over to oral Reglan today. 4. Weakness physical therapy recommended rehabilitation 5. Essential hypertension continue lisinopril and metoprolol and amlodipine 6.  Depression. Appreciate psych consultation. On increased dose of Wellbutrin. 7. History of bullous pemphigus. Restart low-dose prednisone 5 mg daily. Has not been on prednisone since being here. May have had a withdrawal from being off the prednisone for a few days. 8. Hypokalemia replaced  Code Status:     Code Status Orders        Start     Ordered   07/01/15 1052  Do not attempt resuscitation (DNR)   Continuous    Question Answer Comment  In the event of cardiac or respiratory ARREST Do not call a "code blue"   In the event of cardiac or respiratory ARREST Do not perform Intubation, CPR, defibrillation or ACLS   In the event of cardiac or respiratory ARREST Use medication by any route, position, wound care, and other measures to relive pain and suffering. May use oxygen, suction and manual treatment of airway obstruction as needed for comfort.      07/01/15 1051    Code Status History    Date Active Date Inactive Code Status Order ID Comments User Context   07/01/2015 10:31 AM 07/01/2015 10:51 AM Full Code [id number]  Erin Fulling, MD ED   02/22/2013  4:38 AM 02/23/2013  4:50 PM Full Code [id number]  Dorothea Ogle, MD Inpatient     Family Communication: Spoke with sister at the bedside. Care manager will speak with the daughter about rehabs Disposition Plan: Out to rehab tomorrow  Time spent:  Alford Highland  [employer]

## 2015-07-06 NOTE — Consult Note (Signed)
Sylvia Black   Reason for Black:  Black for 70 year old woman with multiple medical problems and history of depression Referring Physician:  [place] Patient Identification: Sylvia Black MRN:  [id number] Principal Diagnosis: Major depression moderate recurrent Diagnosis:   Patient Active Problem List   Diagnosis Date Noted  . Depression [F32.9] 07/04/2015  . Altered mental status [R41.82]   . Diabetic ketoacidosis without coma associated with type 2 diabetes mellitus ([place]) [E13.10]   . Encounter for central line placement [Z45.2]   . Acidosis [E87.2] 07/01/2015  . Stroke [facility]) [I63.9]   . Hypertension [I10]   . Frequent UTI [N39.0]   . Diabetes mellitus without complication ([place]) [V25.3]   . Hypoglycemia [E16.2] 02/21/2013    Total Time spent with patient: 20 minutes  Subjective:   Sylvia Black is a 70 y.o. female patient admitted with "I haven't been doing well for a while".  Follow-up for 71 year old woman with depression. Patient says she is a little bit more down today but still not suicidal and not hopeless. She is a little disappointed she didn't go to rehabilitation any sooner. Affect is still upbeat however.  HPI:  Patient interviewed. Chart reviewed. Labs and vitals reviewed. Patient's daughter apparently had requested a psychiatric Black. Patient came into the hospital with diabetic ketoacidosis. There was a note in the chart that she had been not taking her medicine for about 2 days. On interview today the patient is aware that she will came in because of problems with her blood sugar. She said that the only reason she had not been taking her medicine was because she was throwing up constantly for several days. She's been feeling nauseous for about a week. Been feeling tired. Poor sleep. As far as her mood she says that most of her mood recently has been angry. Not to a severe degree but angry at herself and irritated that her health is getting  in the way of her life. She says she does feel depressed but denies feeling hopeless. Has some problems with sleep some appetite problems but a lot of medical problems. She denies having any suicidal ideation. Denies any wish to die. Indicates that she has positive things that she wants to get better for although she currently has little enjoyment in life. She says that up until the time that she got sick a few days ago she had been taking her Wellbutrin which is prescribed by her pain clinic although she thinks of that is more of a pain medicine.  Social history: Patient lives with her daughter and grandson. Her husband died 5 years ago. It's been hard for her ever since he died. Has not been functioning well and her function is gotten worse over the last several months. She think she probably hasn't gotten out of bed in 6 months.  Medical history: Obesity, diabetes, history of strokes and high blood pressure  Substance abuse history: Denies any history of alcohol or drug abuse.  Past Psychiatric History: Patient indicates that she has had psychiatric admissions before but was about 15 years ago. She denies that she's ever tried to kill her self. She cannot remember any antidepressant she has been prescribed besides Wellbutrin.  Risk to Self: Is patient at risk for suicide?: No Risk to Others:   Prior Inpatient Therapy:   Prior Outpatient Therapy:    Past Medical History:  Past Medical History  Diagnosis Date  . Hypertension   . Frequent UTI   . PONV (postoperative  nausea and vomiting)     "makes me violently ill" (02/22/2013)  . Family history of anesthesia complication     "daughter's w/PONV" (02/22/2013)  . High cholesterol   . Type II diabetes mellitus ([place])   . Pneumonia 1990's    "once"  . Sleep apnea     "dx'd in the 1990's; don't wear mask" (02/22/2013)  . GERD (gastroesophageal reflux disease)   . Stroke [facility]) 2010; 02/2012    denies residual; "balance issues since & weak on left  side since" (02/22/2013)  . 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 Gout attack     "twice" (02/22/2013)  . Anxiety   . Depression   . Bipolar disorder ([place])   . Vertigo     Past Surgical History  Procedure Laterality Date  . Posterior fusion cervical spine  ~ 1988  . Anterior cervical decomp/discectomy fusion  1989  . Cholecystectomy  1984  . Appendectomy  1984  . Tubal ligation  1973  . Wrist surgery Right ~ 1985    "tied off damagaed nerve" (02/22/2013)  . Cataract extraction w/ intraocular lens  implant, bilateral Bilateral ~ 2008   Family History: No family history on file. Family Psychiatric  History: She indicates that there are a lot of emotional problems and several members of her family. Social History:  History  Alcohol Use  . Yes    Comment: 02/22/2013 "mixed drink on special occasions; glass of wine q 2-3 months"     History  Drug Use No    Social History   Social History  . Marital Status: Widowed    Spouse Name: N/A  . Number of Children: N/A  . Years of Education: N/A   Social History Main Topics  . Smoking status: Passive Smoke Exposure - Never Smoker  . Smokeless tobacco: Never Used  . Alcohol Use: Yes     Comment: 02/22/2013 "mixed drink on special occasions; glass of wine q 2-3 months"  . Drug Use: No  . Sexual Activity: No   Other Topics Concern  . None   Social History Narrative   Additional Social History:    Allergies:   Allergies  Allergen Reactions  . Ciprofloxacin     Other reaction(s): Hallucinations  . Penicillins Hives    Has patient had a PCN reaction causing immediate rash, facial/tongue/throat swelling, SOB or lightheadedness with hypotension: Yes Has patient had a PCN reaction causing severe rash involving mucus membranes or skin necrosis: No Has patient had a PCN reaction that required hospitalization Yes Has patient had a PCN reaction occurring within the last 10 years: No If all of the above  answers are "NO", then may proceed with Cephalosporin use.  . Sulfa Antibiotics     "hives"  . Tetanus Toxoids     "I had a strong reaction".    Labs:  Results for orders placed or performed during the hospital encounter of 07/01/15 (from the past 48 hour(s))  Glucose, capillary     Status: None   Collection Time: 07/04/15  9:28 PM  Result Value Ref Range   Glucose-Capillary 94 65 - 99 mg/dL  Basic metabolic panel     Status: Abnormal   Collection Time: 07/05/15  5:06 AM  Result Value Ref Range   Sodium 139 135 - 145 mmol/L   Potassium 3.2 (L) 3.5 - 5.1 mmol/L   Chloride 108 101 - 111 mmol/L   CO2 25  22 - 32 mmol/L   Glucose, Bld 102 (H) 65 - 99 mg/dL   BUN 6 6 - 20 mg/dL   Creatinine, Ser 5.87 0.44 - 1.00 mg/dL   Calcium 8.1 (L) 8.9 - 10.3 mg/dL   GFR calc non Af Amer >60 >60 mL/min   GFR calc Af Amer >60 >60 mL/min    Comment: (NOTE) The eGFR has been calculated using the CKD EPI equation. This calculation has not been validated in all clinical situations. eGFR's persistently <60 mL/min signify possible Chronic Kidney Disease.    Anion gap 6 5 - 15  Glucose, capillary     Status: Abnormal   Collection Time: 07/05/15  7:22 AM  Result Value Ref Range   Glucose-Capillary 123 (H) 65 - 99 mg/dL  Glucose, capillary     Status: Abnormal   Collection Time: 07/05/15 10:18 AM  Result Value Ref Range   Glucose-Capillary 177 (H) 65 - 99 mg/dL  Glucose, capillary     Status: Abnormal   Collection Time: 07/05/15 12:10 PM  Result Value Ref Range   Glucose-Capillary 140 (H) 65 - 99 mg/dL  Glucose, capillary     Status: Abnormal   Collection Time: 07/05/15  4:18 PM  Result Value Ref Range   Glucose-Capillary 151 (H) 65 - 99 mg/dL  Glucose, capillary     Status: Abnormal   Collection Time: 07/05/15  9:39 PM  Result Value Ref Range   Glucose-Capillary 109 (H) 65 - 99 mg/dL  Lipid panel     Status: Abnormal   Collection Time: 07/06/15  5:05 AM  Result Value Ref Range    Cholesterol 137 0 - 200 mg/dL   Triglycerides 092 (H) <150 mg/dL   HDL 30 (L) >94 mg/dL   Total CHOL/HDL Ratio 4.6 RATIO   VLDL 34 0 - 40 mg/dL   LDL Cholesterol 73 0 - 99 mg/dL    Comment:        Total Cholesterol/HDL:CHD Risk Coronary Heart Disease Risk Table                     Men   Women  1/2 Average Risk   3.4   3.3  Average Risk       5.0   4.4  2 X Average Risk   9.6   7.1  3 X Average Risk  23.4   11.0        Use the calculated Patient Ratio above and the CHD Risk Table to determine the patient's CHD Risk.        ATP III CLASSIFICATION (LDL):  <100     mg/dL   Optimal  165-081  mg/dL   Near or Above                    Optimal  130-159  mg/dL   Borderline  365-311  mg/dL   High  >394     mg/dL   Very High   Basic metabolic panel     Status: Abnormal   Collection Time: 07/06/15  5:05 AM  Result Value Ref Range   Sodium 138 135 - 145 mmol/L   Potassium 3.9 3.5 - 5.1 mmol/L   Chloride 106 101 - 111 mmol/L   CO2 23 22 - 32 mmol/L   Glucose, Bld 140 (H) 65 - 99 mg/dL   BUN 8 6 - 20 mg/dL   Creatinine, Ser 9.14 0.44 - 1.00 mg/dL   Calcium 8.0 (L) 8.9 -  10.3 mg/dL   GFR calc non Af Amer >60 >60 mL/min   GFR calc Af Amer >60 >60 mL/min    Comment: (NOTE) The eGFR has been calculated using the CKD EPI equation. This calculation has not been validated in all clinical situations. eGFR's persistently <60 mL/min signify possible Chronic Kidney Disease.    Anion gap 9 5 - 15  Glucose, capillary     Status: Abnormal   Collection Time: 07/06/15  7:37 AM  Result Value Ref Range   Glucose-Capillary 153 (H) 65 - 99 mg/dL  Glucose, capillary     Status: Abnormal   Collection Time: 07/06/15 12:44 PM  Result Value Ref Range   Glucose-Capillary 124 (H) 65 - 99 mg/dL  Glucose, capillary     Status: Abnormal   Collection Time: 07/06/15  4:45 PM  Result Value Ref Range   Glucose-Capillary 153 (H) 65 - 99 mg/dL    Current Facility-Administered Medications  Medication Dose  Route Frequency Provider Last Rate Last Dose  . 0.9 %  sodium chloride infusion  250 mL Intravenous PRN Flora Lipps, MD      . acetaminophen (TYLENOL) tablet 650 mg  650 mg Oral Q4H PRN Flora Lipps, MD      . amLODipine (NORVASC) tablet 5 mg  5 mg Oral Daily Anders Simmonds, MD   5 mg at 07/06/15 0854  . ammonium lactate (LAC-HYDRIN) 12 % lotion   Topical BID Loletha Grayer, MD      . atorvastatin (LIPITOR) tablet 40 mg  40 mg Oral q1800 Loletha Grayer, MD   40 mg at 07/06/15 1701  . buPROPion (WELLBUTRIN XL) 24 hr tablet 300 mg  300 mg Oral Daily Gonzella Lex, MD   300 mg at 07/06/15 0854  . clopidogrel (PLAVIX) tablet 75 mg  75 mg Oral Daily Loletha Grayer, MD   75 mg at 07/06/15 0854  . enoxaparin (LOVENOX) injection 40 mg  40 mg Subcutaneous Q12H Flora Lipps, MD   40 mg at 07/06/15 0854  . insulin aspart (novoLOG) injection 0-15 Units  0-15 Units Subcutaneous TID WC Wilhelmina Mcardle, MD   3 Units at 07/06/15 1702  . insulin aspart (novoLOG) injection 0-5 Units  0-5 Units Subcutaneous QHS Wilhelmina Mcardle, MD   0 Units at 07/03/15 2249  . insulin aspart (novoLOG) injection 4 Units  4 Units Subcutaneous TID WC Wilhelmina Mcardle, MD   4 Units at 07/06/15 1701  . insulin glargine (LANTUS) injection 30 Units  30 Units Subcutaneous Daily Loletha Grayer, MD   30 Units at 07/06/15 [phone]  . lisinopril (PRINIVIL,ZESTRIL) tablet 40 mg  40 mg Oral Daily Flora Lipps, MD   40 mg at 07/06/15 0854  . metoCLOPramide (REGLAN) tablet 10 mg  10 mg Oral TID AC & HS Loletha Grayer, MD   10 mg at 07/06/15 1701  . metoprolol succinate (TOPROL-XL) 24 hr tablet 100 mg  100 mg Oral Daily Flora Lipps, MD   100 mg at 07/06/15 0854  . ondansetron (ZOFRAN) injection 4 mg  4 mg Intravenous Q6H PRN Flora Lipps, MD   4 mg at 07/03/15 1315  . pneumococcal 23 valent vaccine (PNU-IMMUNE) injection 0.5 mL  0.5 mL Intramuscular Tomorrow-1000 Flora Lipps, MD   0.5 mL at 07/03/15 1753  . predniSONE (DELTASONE) tablet 5 mg  5 mg Oral  Q breakfast Loletha Grayer, MD   5 mg at 07/06/15 6384    Musculoskeletal: Strength & Muscle Tone: decreased Gait & Station:  unable to stand Patient leans: N/A  Psychiatric Specialty Exam: Physical Exam  Nursing note and vitals reviewed. Constitutional: She appears well-developed and well-nourished.  HENT:  Head: Normocephalic and atraumatic.  Eyes: Conjunctivae are normal. Pupils are equal, round, and reactive to light.  Neck: Normal range of motion.  Cardiovascular: Regular rhythm and normal heart sounds.   Respiratory: Effort normal. No respiratory distress.  GI: Soft.  Musculoskeletal: Normal range of motion.  Neurological: She is alert.  Skin: Skin is warm and dry.  Psychiatric: She has a normal mood and affect. Her speech is normal and behavior is normal. Judgment and thought content normal. Cognition and memory are normal.    Review of Systems  Constitutional: Negative.   HENT: Negative.   Eyes: Negative.   Respiratory: Negative.   Cardiovascular: Negative.   Gastrointestinal: Negative.   Musculoskeletal: Negative.   Skin: Negative.   Neurological: Negative.   Psychiatric/Behavioral: Positive for depression. Negative for suicidal ideas, hallucinations, memory loss and substance abuse. The patient is not nervous/anxious and does not have insomnia.     Blood pressure 146/66, pulse 84, temperature 96.7 F (35.9 C), temperature source Oral, resp. rate 20, height '5\' 3"'$  (1.6 m), weight 103.42 kg (228 lb), SpO2 96 %.Body mass index is 40.4 kg/(m^2).  General Appearance: Casual  Eye Contact:  Good  Speech:  Normal Rate  Volume:  Normal  Mood:  Dysphoric  Affect:  Appropriate  Thought Process:  Goal Directed  Orientation:  Full (Time, Place, and Person)  Thought Content:  Logical  Suicidal Thoughts:  No  Homicidal Thoughts:  No  Memory:  Immediate;   Good Recent;   Poor Remote;   Fair  Judgement:  Fair  Insight:  Fair  Psychomotor Activity:  Decreased   Concentration:  Concentration: Fair  Recall:  [employer] of Knowledge:  Fair  Language:  Fair  Akathisia:  No  Handed:  Right  AIMS (if indicated):     Assets:  Communication Skills Desire for Improvement Housing Resilience Social Support  ADL's:  Impaired  Cognition:  WNL  Sleep:        Treatment Plan Summary: Plan No change to medication. Supportive counseling. Need psychiatric follow-up and therapy longer term.  Disposition: Patient does not meet criteria for psychiatric inpatient admission. Supportive therapy provided about ongoing stressors.  Alethia Berthold, MD 07/06/2015 6:50 PM

## 2015-07-07 LAB — GLUCOSE, CAPILLARY
GLUCOSE-CAPILLARY: 249 mg/dL — AB (ref 65–99)
GLUCOSE-CAPILLARY: 169 mg/dL — AB (ref 65–99)
Glucose-Capillary: 171 mg/dL — ABNORMAL HIGH (ref 65–99)
Glucose-Capillary: 191 mg/dL — ABNORMAL HIGH (ref 65–99)

## 2015-07-07 LAB — BLOOD GAS, VENOUS: PH VEN: 7.04 — AB (ref 7.320–7.430)

## 2015-07-07 MED ORDER — INSULIN GLARGINE 100 UNIT/ML ~~LOC~~ SOLN: 30.0000 [IU] | Freq: Every day | SUBCUTANEOUS | Status: DC

## 2015-07-07 MED ORDER — AMLODIPINE BESYLATE 5 MG PO TABS: 5.0000 mg | ORAL_TABLET | Freq: Every day | ORAL | Status: DC

## 2015-07-07 MED ORDER — METOPROLOL SUCCINATE ER 100 MG PO TB24: 100.0000 mg | ORAL_TABLET | Freq: Every day | ORAL | Status: DC

## 2015-07-07 MED ORDER — BUPROPION HCL ER (XL) 300 MG PO TB24: 300.0000 mg | ORAL_TABLET | Freq: Every day | ORAL | Status: DC

## 2015-07-07 MED ORDER — METOCLOPRAMIDE HCL 10 MG PO TABS: 10.0000 mg | ORAL_TABLET | Freq: Three times a day (TID) | ORAL | Status: DC

## 2015-07-07 MED ORDER — PREDNISONE 5 MG PO TABS: 5.0000 mg | ORAL_TABLET | Freq: Every day | ORAL | Status: DC

## 2015-07-07 MED ORDER — CLOPIDOGREL BISULFATE 75 MG PO TABS: 75.0000 mg | ORAL_TABLET | Freq: Every day | ORAL | Status: DC

## 2015-07-07 MED ORDER — LISINOPRIL 40 MG PO TABS: 40.0000 mg | ORAL_TABLET | Freq: Every day | ORAL | Status: DC

## 2015-07-07 MED ORDER — INSULIN ASPART 100 UNIT/ML ~~LOC~~ SOLN: 4.0000 [IU] | Freq: Three times a day (TID) | SUBCUTANEOUS | Status: DC

## 2015-07-07 MED ORDER — AMMONIUM LACTATE 12 % EX LOTN: TOPICAL_LOTION | CUTANEOUS | Status: AC

## 2015-07-07 MED ORDER — ATORVASTATIN CALCIUM 40 MG PO TABS: 40.0000 mg | ORAL_TABLET | Freq: Every day | ORAL | Status: DC

## 2015-07-07 NOTE — Consult Note (Signed)
[place] [facility] Consult   Reason for Consult:  Consult for 70 year old woman with multiple medical problems and history of depression Referring Physician:  [place] Patient Identification: Sylvia Black MRN:  [id number] Principal Diagnosis: Major depression moderate recurrent Diagnosis:   Patient Active Problem List   Diagnosis Date Noted  . Depression [F32.9] 07/04/2015  . Altered mental status [R41.82]   . Diabetic ketoacidosis without coma associated with type 2 diabetes mellitus ([place]) [E13.10]   . Encounter for central line placement [Z45.2]   . Acidosis [E87.2] 07/01/2015  . Stroke [facility]) [I63.9]   . Hypertension [I10]   . Frequent UTI [N39.0]   . Diabetes mellitus without complication ([place]) [T73.2]   . Hypoglycemia [E16.2] 02/21/2013    Total Time spent with patient: 20 minutes  Subjective:   Sylvia Black is a 70 y.o. female patient admitted with "I haven't been doing well for a while". Follow-up Friday the 16th. Patient says that mentally she is feeling stable. No return of major depression. She is tired today. She seems to be a little less clear about when she will be discharged. Affect is calm and appropriate. No sign of delirium. She is eating adequately. No need to change any medication at this point.  HPI:  Patient interviewed. Chart reviewed. Labs and vitals reviewed. Patient's daughter apparently had requested a psychiatric consult. Patient came into the hospital with diabetic ketoacidosis. There was a note in the chart that she had been not taking her medicine for about 2 days. On interview today the patient is aware that she will came in because of problems with her blood sugar. She said that the only reason she had not been taking her medicine was because she was throwing up constantly for several days. She's been feeling nauseous for about a week. Been feeling tired. Poor sleep. As far as her mood she says that most of her mood recently has been angry. Not to  a severe degree but angry at herself and irritated that her health is getting in the way of her life. She says she does feel depressed but denies feeling hopeless. Has some problems with sleep some appetite problems but a lot of medical problems. She denies having any suicidal ideation. Denies any wish to die. Indicates that she has positive things that she wants to get better for although she currently has little enjoyment in life. She says that up until the time that she got sick a few days ago she had been taking her Wellbutrin which is prescribed by her pain clinic although she thinks of that is more of a pain medicine.  Social history: Patient lives with her daughter and grandson. Her husband died 5 years ago. It's been hard for her ever since he died. Has not been functioning well and her function is gotten worse over the last several months. She think she probably hasn't gotten out of bed in 6 months.  Medical history: Obesity, diabetes, history of strokes and high blood pressure  Substance abuse history: Denies any history of alcohol or drug abuse.  Past Psychiatric History: Patient indicates that she has had psychiatric admissions before but was about 15 years ago. She denies that she's ever tried to kill her self. She cannot remember any antidepressant she has been prescribed besides Wellbutrin.  Risk to Self: Is patient at risk for suicide?: No Risk to Others:   Prior Inpatient Therapy:   Prior Outpatient Therapy:    Past Medical History:  Past Medical History  Diagnosis Date  . Hypertension   . Frequent UTI   . PONV (postoperative nausea and vomiting)     "makes me violently ill" (02/22/2013)  . Family history of anesthesia complication     "daughter's w/PONV" (02/22/2013)  . High cholesterol   . Type II diabetes mellitus (HCC)   . Pneumonia 1990's    "once"  . Sleep apnea     "dx'd in the 1990's; don't wear mask" (02/22/2013)  . GERD (gastroesophageal reflux disease)   . Stroke  [facility]) 2010; 02/2012    denies residual; "balance issues since & weak on left side since" (02/22/2013)  . 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 Gout attack     "twice" (02/22/2013)  . Anxiety   . Depression   . Bipolar disorder (HCC)   . Vertigo     Past Surgical History  Procedure Laterality Date  . Posterior fusion cervical spine  ~ 1988  . Anterior cervical decomp/discectomy fusion  1989  . Cholecystectomy  1984  . Appendectomy  1984  . Tubal ligation  1973  . Wrist surgery Right ~ 1985    "tied off damagaed nerve" (02/22/2013)  . Cataract extraction w/ intraocular lens  implant, bilateral Bilateral ~ 2008   Family History: No family history on file. Family Psychiatric  History: She indicates that there are a lot of emotional problems and several members of her family. Social History:  History  Alcohol Use  . Yes    Comment: 02/22/2013 "mixed drink on special occasions; glass of wine q 2-3 months"     History  Drug Use No    Social History   Social History  . Marital Status: Widowed    Spouse Name: N/A  . Number of Children: N/A  . Years of Education: N/A   Social History Main Topics  . Smoking status: Passive Smoke Exposure - Never Smoker  . Smokeless tobacco: Never Used  . Alcohol Use: Yes     Comment: 02/22/2013 "mixed drink on special occasions; glass of wine q 2-3 months"  . Drug Use: No  . Sexual Activity: No   Other Topics Concern  . None   Social History Narrative   Additional Social History:    Allergies:   Allergies  Allergen Reactions  . Ciprofloxacin     Other reaction(s): Hallucinations  . Penicillins Hives    Has patient had a PCN reaction causing immediate rash, facial/tongue/throat swelling, SOB or lightheadedness with hypotension: Yes Has patient had a PCN reaction causing severe rash involving mucus membranes or skin necrosis: No Has patient had a PCN reaction that required hospitalization Yes Has patient had  a PCN reaction occurring within the last 10 years: No If all of the above answers are "NO", then may proceed with Cephalosporin use.  . Sulfa Antibiotics     "hives"  . Tetanus Toxoids     "I had a strong reaction".    Labs:  Results for orders placed or performed during the hospital encounter of 07/01/15 (from the past 48 hour(s))  Glucose, capillary     Status: Abnormal   Collection Time: 07/05/15  9:39 PM  Result Value Ref Range   Glucose-Capillary 109 (H) 65 - 99 mg/dL  Lipid panel     Status: Abnormal   Collection Time: 07/06/15  5:05 AM  Result Value Ref Range   Cholesterol 137 0 - 200 mg/dL   Triglycerides 612 (H) <150  mg/dL   HDL 30 (L) >73 mg/dL   Total CHOL/HDL Ratio 4.6 RATIO   VLDL 34 0 - 40 mg/dL   LDL Cholesterol 73 0 - 99 mg/dL    Comment:        Total Cholesterol/HDL:CHD Risk Coronary Heart Disease Risk Table                     Men   Women  1/2 Average Risk   3.4   3.3  Average Risk       5.0   4.4  2 X Average Risk   9.6   7.1  3 X Average Risk  23.4   11.0        Use the calculated Patient Ratio above and the CHD Risk Table to determine the patient's CHD Risk.        ATP III CLASSIFICATION (LDL):  <100     mg/dL   Optimal  465-749  mg/dL   Near or Above                    Optimal  130-159  mg/dL   Borderline  639-022  mg/dL   High  >800     mg/dL   Very High   Basic metabolic panel     Status: Abnormal   Collection Time: 07/06/15  5:05 AM  Result Value Ref Range   Sodium 138 135 - 145 mmol/L   Potassium 3.9 3.5 - 5.1 mmol/L   Chloride 106 101 - 111 mmol/L   CO2 23 22 - 32 mmol/L   Glucose, Bld 140 (H) 65 - 99 mg/dL   BUN 8 6 - 20 mg/dL   Creatinine, Ser 3.66 0.44 - 1.00 mg/dL   Calcium 8.0 (L) 8.9 - 10.3 mg/dL   GFR calc non Af Amer >60 >60 mL/min   GFR calc Af Amer >60 >60 mL/min    Comment: (NOTE) The eGFR has been calculated using the CKD EPI equation. This calculation has not been validated in all clinical situations. eGFR's  persistently <60 mL/min signify possible Chronic Kidney Disease.    Anion gap 9 5 - 15  Glucose, capillary     Status: Abnormal   Collection Time: 07/06/15  7:37 AM  Result Value Ref Range   Glucose-Capillary 153 (H) 65 - 99 mg/dL  Glucose, capillary     Status: Abnormal   Collection Time: 07/06/15 12:44 PM  Result Value Ref Range   Glucose-Capillary 124 (H) 65 - 99 mg/dL  Glucose, capillary     Status: Abnormal   Collection Time: 07/06/15  4:45 PM  Result Value Ref Range   Glucose-Capillary 153 (H) 65 - 99 mg/dL  Glucose, capillary     Status: Abnormal   Collection Time: 07/06/15 10:26 PM  Result Value Ref Range   Glucose-Capillary 120 (H) 65 - 99 mg/dL  Glucose, capillary     Status: Abnormal   Collection Time: 07/07/15  7:40 AM  Result Value Ref Range   Glucose-Capillary 171 (H) 65 - 99 mg/dL  Glucose, capillary     Status: Abnormal   Collection Time: 07/07/15  8:50 AM  Result Value Ref Range   Glucose-Capillary 249 (H) 65 - 99 mg/dL  Glucose, capillary     Status: Abnormal   Collection Time: 07/07/15 11:35 AM  Result Value Ref Range   Glucose-Capillary 169 (H) 65 - 99 mg/dL  Glucose, capillary     Status: Abnormal   Collection Time:  07/07/15  4:24 PM  Result Value Ref Range   Glucose-Capillary 191 (H) 65 - 99 mg/dL   Comment 1 Notify RN     Current Facility-Administered Medications  Medication Dose Route Frequency Provider Last Rate Last Dose  . 0.9 %  sodium chloride infusion  250 mL Intravenous PRN Erin Fulling, MD      . acetaminophen (TYLENOL) tablet 650 mg  650 mg Oral Q4H PRN Erin Fulling, MD      . amLODipine (NORVASC) tablet 5 mg  5 mg Oral Daily Karl Ito, MD   5 mg at 07/07/15 0919  . ammonium lactate (LAC-HYDRIN) 12 % lotion   Topical BID Alford Highland, MD      . atorvastatin (LIPITOR) tablet 40 mg  40 mg Oral q1800 Alford Highland, MD   40 mg at 07/07/15 1713  . buPROPion (WELLBUTRIN XL) 24 hr tablet 300 mg  300 mg Oral Daily Audery Amel, MD    300 mg at 07/07/15 0910  . clopidogrel (PLAVIX) tablet 75 mg  75 mg Oral Daily Alford Highland, MD   75 mg at 07/07/15 0910  . enoxaparin (LOVENOX) injection 40 mg  40 mg Subcutaneous Q12H Erin Fulling, MD   40 mg at 07/07/15 0909  . insulin aspart (novoLOG) injection 0-15 Units  0-15 Units Subcutaneous TID WC Merwyn Katos, MD   3 Units at 07/07/15 1714  . insulin aspart (novoLOG) injection 0-5 Units  0-5 Units Subcutaneous QHS Merwyn Katos, MD   0 Units at 07/03/15 2249  . insulin aspart (novoLOG) injection 4 Units  4 Units Subcutaneous TID WC Merwyn Katos, MD   4 Units at 07/07/15 1713  . insulin glargine (LANTUS) injection 30 Units  30 Units Subcutaneous Daily Alford Highland, MD   30 Units at 07/07/15 [phone]  . lisinopril (PRINIVIL,ZESTRIL) tablet 40 mg  40 mg Oral Daily Erin Fulling, MD   40 mg at 07/07/15 0918  . metoCLOPramide (REGLAN) tablet 10 mg  10 mg Oral TID AC & HS Alford Highland, MD   10 mg at 07/07/15 1213  . metoprolol succinate (TOPROL-XL) 24 hr tablet 100 mg  100 mg Oral Daily Erin Fulling, MD   100 mg at 07/07/15 0918  . ondansetron (ZOFRAN) injection 4 mg  4 mg Intravenous Q6H PRN Erin Fulling, MD   4 mg at 07/03/15 1315  . pneumococcal 23 valent vaccine (PNU-IMMUNE) injection 0.5 mL  0.5 mL Intramuscular Tomorrow-1000 Erin Fulling, MD   0.5 mL at 07/03/15 1753  . predniSONE (DELTASONE) tablet 5 mg  5 mg Oral Q breakfast Alford Highland, MD   5 mg at 07/07/15 8286    Musculoskeletal: Strength & Muscle Tone: decreased Gait & Station: unable to stand Patient leans: N/A  Psychiatric Specialty Exam: Physical Exam  Nursing note and vitals reviewed. Constitutional: She appears well-developed and well-nourished.  HENT:  Head: Normocephalic and atraumatic.  Eyes: Conjunctivae are normal. Pupils are equal, round, and reactive to light.  Neck: Normal range of motion.  Cardiovascular: Regular rhythm and normal heart sounds.   Respiratory: Effort normal. No respiratory  distress.  GI: Soft.  Musculoskeletal: Normal range of motion.  Neurological: She is alert.  Skin: Skin is warm and dry.  Psychiatric: She has a normal mood and affect. Her speech is normal and behavior is normal. Judgment and thought content normal. Cognition and memory are normal.    Review of Systems  Constitutional: Negative.   HENT: Negative.   Eyes: Negative.  Respiratory: Negative.   Cardiovascular: Negative.   Gastrointestinal: Negative.   Musculoskeletal: Negative.   Skin: Negative.   Neurological: Negative.   Psychiatric/Behavioral: Positive for depression. Negative for suicidal ideas, hallucinations, memory loss and substance abuse. The patient is not nervous/anxious and does not have insomnia.     Blood pressure 137/58, pulse 82, temperature 98.5 F (36.9 C), temperature source Oral, resp. rate 18, height '5\' 3"'$  (1.6 m), weight 105.235 kg (232 lb), SpO2 97 %.Body mass index is 41.11 kg/(m^2).  General Appearance: Casual  Eye Contact:  Good  Speech:  Normal Rate  Volume:  Normal  Mood:  Dysphoric  Affect:  Appropriate  Thought Process:  Goal Directed  Orientation:  Full (Time, Place, and Person)  Thought Content:  Logical  Suicidal Thoughts:  No  Homicidal Thoughts:  No  Memory:  Immediate;   Good Recent;   Poor Remote;   Fair  Judgement:  Fair  Insight:  Fair  Psychomotor Activity:  Decreased  Concentration:  Concentration: Fair  Recall:  [employer] of Knowledge:  Fair  Language:  Fair  Akathisia:  No  Handed:  Right  AIMS (if indicated):     Assets:  Communication Skills Desire for Improvement Housing Resilience Social Support  ADL's:  Impaired  Cognition:  WNL  Sleep:        Treatment Plan Summary: Plan No change to medication. Supportive counseling. Need psychiatric follow-up and therapy longer term.  Disposition: Patient does not meet criteria for psychiatric inpatient admission. Supportive therapy provided about ongoing stressors.  Alethia Berthold, MD 07/07/2015 5:19 PM

## 2015-07-07 NOTE — Progress Notes (Signed)
Patient ID: Sylvia Black, female   DOB: [birth year], 70 y.o.   MRN: [id number] [facility] PROGRESS NOTE  Sylvia Black WUJ:[id number] DOB: Jun 10, 1945 DOA: 07/01/2015 PCP: Emogene Morgan, MD  HPI/Subjective: Patient still has not left the hospital yet. Awaiting insurance approval for rehabilitation. This will be unavoidable hospital day if the patient is not discharged to rehabilitation today. Patient feels well and offers no complaints. She feels her speech is better.  Objective: Filed Vitals:   07/07/15 0916 07/07/15 1335  BP: 144/70 137/58  Pulse: 87 82  Temp: 98 F (36.7 C) 98.5 F (36.9 C)  Resp:  18    Filed Weights   07/05/15 0500 07/06/15 0534 07/07/15 0429  Weight: 103.284 kg (227 lb 11.2 oz) 103.42 kg (228 lb) 105.235 kg (232 lb)    ROS: Review of Systems  Constitutional: Negative for fever and chills.  Eyes: Negative for blurred vision.  Respiratory: Negative for cough and shortness of breath.   Cardiovascular: Negative for chest pain.  Gastrointestinal: Negative for nausea, vomiting, abdominal pain, diarrhea and constipation.  Genitourinary: Negative for dysuria.  Musculoskeletal: Negative for joint pain.  Neurological: Positive for speech change. Negative for dizziness and headaches.   Exam: Physical Exam  HENT:  Nose: No mucosal edema.  Mouth/Throat: No oropharyngeal exudate or posterior oropharyngeal edema.  Eyes: Conjunctivae, EOM and lids are normal. Pupils are equal, round, and reactive to light.  Neck: No JVD present. Carotid bruit is not present. No edema present. No thyroid mass and no thyromegaly present.  Cardiovascular: S1 normal and S2 normal.  Exam reveals no gallop.   No murmur heard. Pulses:      Dorsalis pedis pulses are 2+ on the right side, and 2+ on the left side.  Respiratory: No respiratory distress. She has no wheezes. She has no rhonchi. She has no rales.  GI: Soft. Bowel sounds are normal. There is no tenderness.  Musculoskeletal:        Right ankle: She exhibits swelling.       Left ankle: She exhibits swelling.  Lymphadenopathy:    She has no cervical adenopathy.  Neurological: She is alert.  Slurred speech is less pronounced today.  Skin: Skin is warm. Nails show no clubbing.  Scaling on the feet  Psychiatric: She has a normal mood and affect.      Data Reviewed: Basic Metabolic Panel:  Recent Labs Lab 07/01/15 1151  07/02/15 0447  07/02/15 1500 07/03/15 0543 07/04/15 0539 07/05/15 0506 07/06/15 0505  NA 136  < > 141  < > 137 135 139 139 138  K 3.7  < > 2.8*  < > 4.1 3.9 3.6 3.2* 3.9  CL 111  < > 114*  < > 108 107 109 108 106  CO2 <7*  < > 20*  < > 21* GLUCOSE 423*  < > 163*  < > 170* 210* 173* 102* 140*  BUN 15  < > 12  < > CREATININE 1.10*  < > 0.64  < > 0.58 0.68 0.59 0.58 0.47  CALCIUM 8.8*  < > 8.2*  < > 7.9* 7.8* 8.1* 8.1* 8.0*  MG 1.4*  --  1.2*  --  2.3 2.0  --   --   --   PHOS 3.1  --  <1.0*  --  1.7* 3.6  --   --   --   < > = values in this interval  not displayed. Liver Function Tests:  Recent Labs Lab 07/01/15 0830  AST 16  ALT 20  ALKPHOS 93  BILITOT 1.6*  PROT 7.0  ALBUMIN 3.2*    Recent Labs Lab 07/01/15 0830  LIPASE 42   CBC:  Recent Labs Lab 07/01/15 0830 07/01/15 1151 07/02/15 0447 07/03/15 0543  WBC 15.6* 16.8* 10.6 9.6  HGB 12.4 11.8* 10.0* 9.9*  HCT 38.7 36.8 29.4* 29.6*  MCV 90.4 91.7 86.6 87.7  PLT 422 382 324 306   Cardiac Enzymes:  Recent Labs Lab 07/01/15 0830  TROPONINI <0.03    CBG:  Recent Labs Lab 07/06/15 1645 07/06/15 2226 07/07/15 0740 07/07/15 0850 07/07/15 1135  GLUCAP 153* 120* 171* 249* 169*    Recent Results (from the past 240 hour(s))  Urine culture     Status: Abnormal   Collection Time: 07/01/15  8:30 AM  Result Value Ref Range Status   Specimen Description URINE, CATHETERIZED  Final   Special Requests NONE  Final   Culture (A)  Final    <10,000 COLONIES/mL INSIGNIFICANT  GROWTH [facility] [place]    Report Status 07/02/2015 FINAL  Final  MRSA PCR Screening     Status: None   Collection Time: 07/01/15  1:38 PM  Result Value Ref Range Status   MRSA by PCR NEGATIVE NEGATIVE Final    Comment:        The GeneXpert MRSA Assay (FDA approved for NASAL specimens only), is one component of a comprehensive MRSA colonization surveillance program. It is not intended to diagnose MRSA infection nor to guide or monitor treatment for MRSA infections.   Urine culture     Status: Abnormal   Collection Time: 07/02/15  1:37 AM  Result Value Ref Range Status   Specimen Description URINE, CATHETERIZED  Final   Special Requests NONE  Final   Culture MULTIPLE SPECIES PRESENT, SUGGEST RECOLLECTION (A)  Final   Report Status 07/03/2015 FINAL  Final  C difficile quick scan w PCR reflex     Status: None   Collection Time: 07/02/15  4:00 PM  Result Value Ref Range Status   C Diff antigen NEGATIVE NEGATIVE Final   C Diff toxin NEGATIVE NEGATIVE Final   C Diff interpretation Negative for C. difficile  Final      Scheduled Meds: . amLODipine  5 mg Oral Daily  . ammonium lactate   Topical BID  . atorvastatin  40 mg Oral q1800  . buPROPion  300 mg Oral Daily  . clopidogrel  75 mg Oral Daily  . enoxaparin (LOVENOX) injection  40 mg Subcutaneous Q12H  . insulin aspart  0-15 Units Subcutaneous TID WC  . insulin aspart  0-5 Units Subcutaneous QHS  . insulin aspart  4 Units Subcutaneous TID WC  . insulin glargine  30 Units Subcutaneous Daily  . lisinopril  40 mg Oral Daily  . metoCLOPramide  10 mg Oral TID AC & HS  . metoprolol succinate  100 mg Oral Daily  . pneumococcal 23 valent vaccine  0.5 mL Intramuscular Tomorrow-1000  . predniSONE  5 mg Oral Q breakfast    Assessment/Plan:  1. Slurred speech.  I ordered her Plavix and high dose Lipitor. MRI does not show any acute infarct. Shows progressive small vessel ischemia. 2. Diabetic ketoacidosis. This  has resolved. Continue sliding scale and Lantus insulin. Noncompliance with medications at home 3. Nausea and poor appetite. Could be diabetic gastroparesis. Switched over to oral Reglan.  4. Weakness physical therapy  recommended rehabilitation. Stable today to go. 5. Essential hypertension continue lisinopril, metoprolol, amlodipine 6. Depression.  On increased dose of Wellbutrin. 7. History of bullous pemphigus. low-dose prednisone 5 mg daily.  8. Hypokalemia replaced  Progress note written today just in case insurance authorization does not approve patient to go to rehabilitation.  Code Status:     Code Status Orders        Start     Ordered   07/01/15 1052  Do not attempt resuscitation (DNR)   Continuous    Question Answer Comment  In the event of cardiac or respiratory ARREST Do not call a "code blue"   In the event of cardiac or respiratory ARREST Do not perform Intubation, CPR, defibrillation or ACLS   In the event of cardiac or respiratory ARREST Use medication by any route, position, wound care, and other measures to relive pain and suffering. May use oxygen, suction and manual treatment of airway obstruction as needed for comfort.      07/01/15 1051    Code Status History    Date Active Date Inactive Code Status Order ID Comments User Context   07/01/2015 10:31 AM 07/01/2015 10:51 AM Full Code [id number]  Erin Fulling, MD ED   02/22/2013  4:38 AM 02/23/2013  4:50 PM Full Code [id number]  Dorothea Ogle, MD Inpatient     Family Communication: Spoke with Daughter on the phone this morning Disposition Plan: Out to rehab Today  Time spent: 35 minutes  Alford Highland  [employer]

## 2015-07-07 NOTE — Discharge Summary (Signed)
Discharge summary [facility] - [place] at [facility]   PATIENT NAME: Sylvia Black    MR#:  [id number]  DATE OF BIRTH:  April 12, 1945  DATE OF ADMISSION:  07/01/2015 ADMITTING PHYSICIAN: Erin Fulling, MD  DATE OF DISCHARGE: 07/07/2015  PRIMARY CARE PHYSICIAN: Emogene Morgan, MD    ADMISSION DIAGNOSIS:  Altered mental status, unspecified altered mental status type [R41.82] Diabetic ketoacidosis without coma associated with type 2 diabetes mellitus (HCC) [E13.10]  DISCHARGE DIAGNOSIS:  Active Problems:   Acidosis   Altered mental status   Diabetic ketoacidosis without coma associated with type 2 diabetes mellitus (HCC)   Encounter for central line placement   Depression   SECONDARY DIAGNOSIS:   Past Medical History  Diagnosis Date  . Hypertension   . Frequent UTI   . PONV (postoperative nausea and vomiting)     "makes me violently ill" (02/22/2013)  . Family history of anesthesia complication     "daughter's w/PONV" (02/22/2013)  . High cholesterol   . Type II diabetes mellitus (HCC)   . Pneumonia 1990's    "once"  . Sleep apnea     "dx'd in the 1990's; don't wear mask" (02/22/2013)  . GERD (gastroesophageal reflux disease)   . Stroke [facility]) 2010; 02/2012    denies residual; "balance issues since & weak on left side since" (02/22/2013)  . 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 Gout attack     "twice" (02/22/2013)  . Anxiety   . Depression   . Bipolar disorder (HCC)   . Vertigo     HOSPITAL COURSE:   1. Diabetic ketoacidosis. Patient was on insulin drip protocol and switch over to Lantus insulin and short acting insulin prior to meals. Sugars have been good during the hospital course after stopping insulin drip. Noncompliance with medication is the etiology and also being on prednisone. 2. Labored breathing on presentation. This has resolved breathing comfortably on room air. 3. Acute encephalopathy. This is also resolved. Likely  from the diabetic ketoacidosis. 4. 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 I started Reglan and this has improved and appetite is better. 5. Essential hypertension patient was restarted on blood pressure medications and this has improved 6. Slurred speech. MRI of the brain was negative for acute event did show microvascular disease. Patient was restarted on Plavix and switch over to Lipitor. 7. Hyperlipidemia unspecified. Patient restarted on statin 8. Depression. Patient on increased dose of Wellbutrin. 9. History of bullous pemphigus. Patient on chronic prednisone I decreased the dose down to 5 mg. 10. Hypokalemia this has replaced. 11. Chronic lower extremity erythema. Patient states that this has been there for a long time. I was hesitant on doing any antibiotics because of her allergies and because of her nausea vomiting during the hospital course. 12. Weakness patient will be discharged to rehabilitation today  DISCHARGE CONDITIONS:   Satisfactory  CONSULTS OBTAINED:  Treatment Team:  Audery Amel, MD  DRUG ALLERGIES:   Allergies  Allergen Reactions  . Ciprofloxacin     Other reaction(s): Hallucinations  . Penicillins Hives    Has patient had a PCN reaction causing immediate rash, facial/tongue/throat swelling, SOB or lightheadedness with hypotension: Yes Has patient had a PCN reaction causing severe rash involving mucus membranes or skin necrosis: No Has patient had a PCN reaction that required hospitalization Yes Has patient had a PCN reaction occurring within the last 10 years: No If all of the above answers  are "NO", then may proceed with Cephalosporin use.  . Sulfa Antibiotics     "hives"  . Tetanus Toxoids     "I had a strong reaction".    DISCHARGE MEDICATIONS:   Current Discharge Medication List    START taking these medications   Details  ammonium lactate (LAC-HYDRIN) 12 % lotion Apply twice a day to feet bilaterally Qty: 400 g, Refills: 0     atorvastatin (LIPITOR) 40 MG tablet Take 1 tablet (40 mg total) by mouth daily at 6 PM. Qty: 30 tablet, Refills: 0    insulin aspart (NOVOLOG) 100 UNIT/ML injection Inject 4 Units into the skin 3 (three) times daily with meals. Qty: 10 mL, Refills: 0    insulin glargine (LANTUS) 100 UNIT/ML injection Inject 0.3 mLs (30 Units total) into the skin daily. Qty: 10 mL, Refills: 0    metoCLOPramide (REGLAN) 10 MG tablet Take 1 tablet (10 mg total) by mouth 4 (four) times daily -  before meals and at bedtime. Qty: 120 tablet, Refills: 0      CONTINUE these medications which have CHANGED   Details  amLODipine (NORVASC) 5 MG tablet Take 1 tablet (5 mg total) by mouth daily. Qty: 30 tablet, Refills: 0    buPROPion (WELLBUTRIN XL) 300 MG 24 hr tablet Take 1 tablet (300 mg total) by mouth daily. Qty: 30 tablet, Refills: 0    clopidogrel (PLAVIX) 75 MG tablet Take 1 tablet (75 mg total) by mouth daily. Qty: 30 tablet, Refills: 0    lisinopril (PRINIVIL,ZESTRIL) 40 MG tablet Take 1 tablet (40 mg total) by mouth daily. Qty: 30 tablet, Refills: 0    metoprolol succinate (TOPROL-XL) 100 MG 24 hr tablet Take 1 tablet (100 mg total) by mouth daily. Take with or immediately following a meal. Qty: 30 tablet, Refills: 0    predniSONE (DELTASONE) 5 MG tablet Take 1 tablet (5 mg total) by mouth daily with breakfast. Qty: 30 tablet, Refills: 0      STOP taking these medications     clonazePAM (KLONOPIN) 1 MG tablet      Cranberry-Vitamin C-Probiotic (AZO CRANBERRY PO)      diclofenac sodium (VOLTAREN) 1 % GEL      LANTUS SOLOSTAR 100 UNIT/ML Solostar Pen      lidocaine (LIDODERM) 5 %      meclizine (ANTIVERT) 25 MG tablet      metFORMIN (GLUCOPHAGE-XR) 500 MG 24 hr tablet      pravastatin (PRAVACHOL) 20 MG tablet      tiZANidine (ZANAFLEX) 4 MG tablet          DISCHARGE INSTRUCTIONS:    Follow-up with Dr. rehabilitation 1 day  If you experience worsening of your admission  symptoms, develop shortness of breath, life threatening emergency, suicidal or homicidal thoughts you must seek medical attention immediately by calling 911 or calling your MD immediately  if symptoms less severe.  You Must read complete instructions/literature along with all the possible adverse reactions/side effects for all the Medicines you take and that have been prescribed to you. Take any new Medicines after you have completely understood and accept all the possible adverse reactions/side effects.   Please note  You were cared for by a hospitalist during your hospital stay. If you have any questions about your discharge medications or the care you received while you were in the hospital after you are discharged, you can call the unit and asked to speak with the hospitalist on call if the hospitalist that  took care of you is not available. Once you are discharged, your primary care physician will handle any further medical issues. Please note that NO REFILLS for any discharge medications will be authorized once you are discharged, as it is imperative that you return to your primary care physician (or establish a relationship with a primary care physician if you do not have one) for your aftercare needs so that they can reassess your need for medications and monitor your lab values.    Today   CHIEF COMPLAINT:   Chief Complaint  Patient presents with  . Emesis  . Hyperglycemia    HISTORY OF PRESENT ILLNESS:  Sylvia Black  is a 70 y.o. female patient brought in not feeling well had labored breathing, vomiting and was in diabetic ketoacidosis   VITAL SIGNS:  Blood pressure 138/52, pulse 85, temperature 97.7 F (36.5 C), temperature source Oral, resp. rate 20, height 5\' 3"  (1.6 m), weight 105.235 kg (232 lb), SpO2 97 %.    PHYSICAL EXAMINATION:  GENERAL:  70 y.o.-year-old patient lying in the bed with no acute distress.  EYES: Pupils equal, round, reactive to light and  accommodation. No scleral icterus. Extraocular muscles intact.  HEENT: Head atraumatic, normocephalic. Oropharynx and nasopharynx clear.  NECK:  Supple, no jugular venous distention. No thyroid enlargement, no tenderness.  LUNGS: Normal breath sounds bilaterally, no wheezing, rales,rhonchi or crepitation. No use of accessory muscles of respiration.  CARDIOVASCULAR: S1, S2 normal. 2/6 systolic murmurs. No rubs, or gallops.  ABDOMEN: Soft, non-tender, non-distended. Bowel sounds present. No organomegaly or mass.  EXTREMITIES: 2+ pedal edema, no cyanosis, or clubbing.  NEUROLOGIC: Cranial nerves II through XII are intact.  Sensation intact. Gait not checked.  PSYCHIATRIC: The patient is alert and oriented x 3.  SKIN: Bruising on the upper extremities. Scaling on the feet. No bullous lesions seen. Chronic erythema bilateral shins.  DATA REVIEW:   CBC  Recent Labs Lab 07/03/15 0543  WBC 9.6  HGB 9.9*  HCT 29.6*  PLT 306    Chemistries   Recent Labs Lab 07/01/15 0830  07/03/15 0543  07/06/15 0505  NA 134*  < > 135  < > 138  K 5.2*  < > 3.9  < > 3.9  CL 106  < > 107  < > 106  CO2 <7*  < > 22  < > 23  GLUCOSE 488*  < > 210*  < > 140*  BUN 13  < > 7  < > 8  CREATININE 0.96  < > 0.68  < > 0.47  CALCIUM 8.9  < > 7.8*  < > 8.0*  MG  --   < > 2.0  --   --   AST 16  --   --   --   --   ALT 20  --   --   --   --   ALKPHOS 93  --   --   --   --   BILITOT 1.6*  --   --   --   --   < > = values in this interval not displayed.  Cardiac Enzymes  Recent Labs Lab 07/01/15 0830  TROPONINI <0.03    Microbiology Results  Results for orders placed or performed during the hospital encounter of 07/01/15  Urine culture     Status: Abnormal   Collection Time: 07/01/15  8:30 AM  Result Value Ref Range Status   Specimen Description URINE, CATHETERIZED  Final   Special  Requests NONE  Final   Culture (A)  Final    <10,000 COLONIES/mL INSIGNIFICANT GROWTH Performed at [facility]    Report Status 07/02/2015 FINAL  Final  MRSA PCR Screening     Status: None   Collection Time: 07/01/15  1:38 PM  Result Value Ref Range Status   MRSA by PCR NEGATIVE NEGATIVE Final    Comment:        The GeneXpert MRSA Assay (FDA approved for NASAL specimens only), is one component of a comprehensive MRSA colonization surveillance program. It is not intended to diagnose MRSA infection nor to guide or monitor treatment for MRSA infections.   Urine culture     Status: Abnormal   Collection Time: 07/02/15  1:37 AM  Result Value Ref Range Status   Specimen Description URINE, CATHETERIZED  Final   Special Requests NONE  Final   Culture MULTIPLE SPECIES PRESENT, SUGGEST RECOLLECTION (A)  Final   Report Status 07/03/2015 FINAL  Final  C difficile quick scan w PCR reflex     Status: None   Collection Time: 07/02/15  4:00 PM  Result Value Ref Range Status   C Diff antigen NEGATIVE NEGATIVE Final   C Diff toxin NEGATIVE NEGATIVE Final   C Diff interpretation Negative for C. difficile  Final    RADIOLOGY:  Ct Head Wo Contrast  07/05/2015  CLINICAL DATA:  Slurred speech for 3 weeks. History of stroke x3, most recently 6-8 months ago. EXAM: CT HEAD WITHOUT CONTRAST TECHNIQUE: Contiguous axial images were obtained from the base of the skull through the vertex without intravenous contrast. COMPARISON:  CT head 07/01/2015 and 03/01/2012 FINDINGS: Brain: There is no evidence of acute intracranial hemorrhage, mass lesion, brain edema or extra-axial fluid collection. The ventricles and subarachnoid spaces are prominent but stable. There is no CT evidence of acute cortical infarction. There is a small low-density lesion in the left thalamus on image 14 which is not well seen on the axial images from the prior study. However, based on the reformatted images, this was probably present on the most recent study from 4 days ago. Patchy hypodensities in the periventricular white matter are  otherwise stable. Bones/sinuses/visualized face: The visualized paranasal sinuses, mastoid air cells and middle ears are clear. The calvarium is intact. IMPRESSION: No definite acute intracranial findings. Small low-density focus in the left thalamus is better seen currently, although probably present on the most recent study from 4 days ago. This could reflect a subacute lacunar stroke. Electronically Signed   By: Carey Bullocks M.D.   On: 07/05/2015 11:35   Mr Brain Wo Contrast  07/06/2015  CLINICAL DATA:  70 year old female with metabolic acidosis. Lethargy, slurred speech, confusion. Initial encounter. EXAM: MRI HEAD WITHOUT CONTRAST TECHNIQUE: Multiplanar, multiecho pulse sequences of the brain and surrounding structures were obtained without intravenous contrast. COMPARISON:  Head CT without contrast 07/05/2015. [facility] Brain MRI and intracranial MRA 08/31/2008 FINDINGS: Major intracranial vascular flow voids are stable. No restricted diffusion or evidence of acute infarction. Chronic but increased abnormal T2 and FLAIR hyperintensity in the bilateral cerebral white matter, body of the corpus callosum, left basal ganglia, both thalami, and newly involving the pons (greater on the right) and both cerebellar hemispheres. There are associated occasional chronic micro hemorrhages re- demonstrated in both cerebral hemispheres, which are stable. No cortical encephalomalacia identified. No midline shift, mass effect, evidence of mass lesion, ventriculomegaly, extra-axial collection or acute intracranial hemorrhage. Cervicomedullary junction and pituitary are within normal  limits. Grossly negative visualized cervical spine. Mild left mastoid effusion is new. Negative nasopharynx. Right mastoids are clear. Paranasal sinuses are clear. Interval postoperative changes to both globes. Negative scalp soft tissues. Normal bone marrow signal. IMPRESSION: 1. No acute infarct or acute intracranial abnormality  identified. 2. Chronic but progressed since 2010 signal abnormality affecting the cerebral white matter and deep gray matter nuclei. New similar signal changes in the brainstem and cerebellum. Strongly favor these changes reflect progressed and now advanced chronic small vessel ischemia the main differential consideration is demyelinating disease. Electronically Signed   By: Odessa Fleming M.D.   On: 07/06/2015 13:27   US Carotid Bilateral  07/06/2015  CLINICAL DATA:  71 year old female with a history of slurred speech. Cardiovascular risk factors include hypertension, stroke/ TIA, hyperlipidemia, diabetes EXAM: BILATERAL CAROTID DUPLEX ULTRASOUND TECHNIQUE: Wallace Cullens scale imaging, color Doppler and duplex ultrasound were performed of bilateral carotid and vertebral arteries in the neck. COMPARISON:  No prior duplex FINDINGS: Criteria: Quantification of carotid stenosis is based on velocity parameters that correlate the residual internal carotid diameter with NASCET-based stenosis levels, using the diameter of the distal internal carotid lumen as the denominator for stenosis measurement. The following velocity measurements were obtained: RIGHT ICA:  Systolic 48 cm/sec, Diastolic 11 cm/sec CCA:  53 cm/sec SYSTOLIC ICA/CCA RATIO:  0.9 ECA:  60 cm/sec LEFT ICA:  Systolic 48 cm/sec, Diastolic 11 cm/sec CCA:  51 cm/sec SYSTOLIC ICA/CCA RATIO:  0.9 ECA:  73 cm/sec Right Brachial SBP: Not acquired Left Brachial SBP: Not acquired RIGHT CAROTID ARTERY: No significant calcified disease of the right common carotid artery. Intermediate waveform maintained. Heterogeneous plaque without significant calcifications at the right carotid bifurcation. Low resistance waveform of the right ICA. No significant tortuosity. RIGHT VERTEBRAL ARTERY: Antegrade flow with low resistance waveform. LEFT CAROTID ARTERY: No significant calcified disease of the left common carotid artery. Intermediate waveform maintained. Heterogeneous plaque at the left  carotid bifurcation without significant calcifications. Low resistance waveform of the left ICA. LEFT VERTEBRAL ARTERY:  Antegrade flow with low resistance waveform. IMPRESSION: Color duplex indicates minimal heterogeneous plaque, with no hemodynamically significant stenosis by duplex criteria in the extracranial cerebrovascular circulation. Signed, Yvone Neu. Loreta Ave, DO [facility] Radiology Electronically Signed   By: Gilmer Mor D.O.   On: 07/06/2015 17:11     Management plans discussed with the patient, family and they are in agreement.  CODE STATUS:     Code Status Orders        Start     Ordered   07/01/15 1052  Do not attempt resuscitation (DNR)   Continuous    Question Answer Comment  In the event of cardiac or respiratory ARREST Do not call a "code blue"   In the event of cardiac or respiratory ARREST Do not perform Intubation, CPR, defibrillation or ACLS   In the event of cardiac or respiratory ARREST Use medication by any route, position, wound care, and other measures to relive pain and suffering. May use oxygen, suction and manual treatment of airway obstruction as needed for comfort.      07/01/15 1051    Code Status History    Date Active Date Inactive Code Status Order ID Comments User Context   07/01/2015 10:31 AM 07/01/2015 10:51 AM Full Code [id number]  Erin Fulling, MD ED   02/22/2013  4:38 AM 02/23/2013  4:50 PM Full Code [id number]  Dorothea Ogle, MD Inpatient      TOTAL TIME TAKING CARE  OF THIS PATIENT: 35 minutes.    Alford Highland M.D on 07/07/2015 at 7:42 AM  Between 7am to 6pm - Pager - [phone]  After 6pm go to [email or web address] - password [email or web address]  [facility] Office  [phone]  CC: Primary care physician; Emogene Morgan, MD

## 2015-07-07 NOTE — Discharge Instructions (Signed)
Diabetic Ketoacidosis °Diabetic ketoacidosis is a life-threatening complication of diabetes. If it is not treated, it can cause severe dehydration and organ damage and can lead to a coma or death. °CAUSES °This condition develops when there is not enough of the hormone insulin in the body. Insulin helps the body to break down sugar for energy. Without insulin, the body cannot break down sugar, so it breaks down fats instead. This leads to the production of acids that are called ketones. Ketones are poisonous at high levels. °This condition can be triggered by: °· Stress on the body that is brought on by an illness. °· Medicines that raise blood glucose levels. °· Not taking diabetes medicine. °SYMPTOMS °Symptoms of this condition include: °· Fatigue. °· Weight loss. °· Excessive thirst. °· Light-headedness. °· Fruity or sweet-smelling breath. °· Excessive urination. °· Vision changes. °· Confusion or irritability. °· Nausea. °· Vomiting. °· Rapid breathing. °· Abdominal pain. °· Feeling flushed. °DIAGNOSIS °This condition is diagnosed based on a medical history, a physical exam, and blood tests. You may also have a urine test that checks for ketones. °TREATMENT °This condition may be treated with: °· Fluid replacement. This may be done to correct dehydration. °· Insulin injections. These may be given through the skin or through an IV tube. °· Electrolyte replacement. Electrolytes, such as potassium and sodium, may be given in pill form or through an IV tube. °· Antibiotic medicines. These may be prescribed if your condition was caused by an infection. °HOME CARE INSTRUCTIONS °Eating and Drinking °· Drink enough fluids to keep your urine clear or pale yellow. °· If you cannot eat, alternate between drinking fluids with sugar (such as juice) and salty fluids (such as broth or bouillon). °· If you can eat, follow your usual diet and drink sugar-free liquids, such as water. °Other Instructions °· Take insulin as  directed by your health care provider. Do not skip insulin injections. Do not use expired insulin. °· If your blood sugar is over 240 mg/dL, monitor your urine ketones every 4-6 hours. °· If you were prescribed an antibiotic medicine, finish all of it even if you start to feel better. °· Rest and exercise only as directed by your health care provider. °· If you get sick, call your health care provider and begin treatment quickly. Your body often needs extra insulin to fight an illness. °· Check your blood glucose levels regularly. If your blood glucose is high, drink plenty of fluids. This helps to flush out ketones. °SEEK MEDICAL CARE IF: °· Your blood glucose level is too high or too low. °· You have ketones in your urine. °· You have a fever. °· You cannot eat. °· You cannot tolerate fluids. °· You have been vomiting for more than 2 hours. °· You continue to have symptoms of this condition. °· You develop new symptoms. °SEEK IMMEDIATE MEDICAL CARE IF: °· Your blood glucose levels continue to be high (elevated). °· Your monitor reads "high" even when you are taking insulin. °· You faint. °· You have chest pain. °· You have trouble breathing. °· You have a sudden, severe headache. °· You have sudden weakness in one arm or one leg. °· You have sudden trouble speaking or swallowing. °· You have vomiting or diarrhea that gets worse after 3 hours. °· You feel severely fatigued. °· You have trouble thinking. °· You have abdominal pain. °· You are severely dehydrated. Symptoms of severe dehydration include: °¨ Extreme thirst. °¨ Dry mouth. °¨ Blue lips. °¨   Cold hands and feet. °¨ Rapid breathing. °  °This information is not intended to replace advice given to you by your health care provider. Make sure you discuss any questions you have with your health care provider. °  °Document Released: 01/05/2000 Document Revised: 05/24/2014 Document Reviewed: 12/15/2013 °Elsevier Interactive Patient Education ©2016 [employer] ° °

## 2015-07-07 NOTE — Progress Notes (Signed)
Physical Therapy Treatment Patient Details Name: Sylvia Black MRN: [id number] DOB: 1945/04/10 Today's Date: 07/07/2015    History of Present Illness 70 y/o female here with acidosis.  She has h/o CVA, she has been lethargic with AMS - difficult to assess deifference from baseline.     PT Comments    Pt in bed ready for session. Participated in exercises as described below.  She was more engaged today with increased participation.  She remains with global weakness which limits all mobility skills.  She did better with rolling initiation today but remains max a x 2 for functional care.  She was able to reach handrails with only min assist to grab and was able to initiate rolling when opposite leg was helped into a flexed position.  Bridges remain challenging for patient with poor clearance.     Follow Up Recommendations  SNF     Equipment Recommendations       Recommendations for Other Services       Precautions / Restrictions Precautions Precautions: Fall Restrictions Weight Bearing Restrictions: No    Mobility  Bed Mobility Overal bed mobility: Needs Assistance;+2 for physical assistance Bed Mobility: Rolling Rolling: Max assist (able to reach handrails with min assist.)         General bed mobility comments: remains limited but imprved initiation  for rolling  Transfers                    Ambulation/Gait                 Stairs            Wheelchair Mobility    Modified Rankin (Stroke Patients Only)       Balance                                    Cognition Arousal/Alertness: Awake/alert Behavior During Therapy: WFL for tasks assessed/performed Overall Cognitive Status: Within Functional Limits for tasks assessed                      Exercises General Exercises - Lower Extremity Ankle Circles/Pumps: AAROM;Both;20 reps;Supine Quad Sets: AROM;Both;10 reps;Supine Gluteal Sets: AROM;Both;5 reps;Supine Heel  Slides: AAROM;Both;20 reps;Supine Hip ABduction/ADduction: AAROM;Both;20 reps;Supine Straight Leg Raises: AAROM;Both;20 reps;Supine Other Exercises Other Exercises: bridges x 5 with poor clearance Other Exercises: rolling left and tright in bed x 10 with max assist    General Comments        Pertinent Vitals/Pain Pain Assessment: No/denies pain    Home Living                      Prior Function            PT Goals (current goals can now be found in the care plan section)      Frequency  Min 2X/week    PT Plan Current plan remains appropriate    Co-evaluation             End of Session   Activity Tolerance: Patient tolerated treatment well Patient left: in bed;with call bell/phone within reach;with bed alarm set;with nursing/sitter in room     Time: 4098-1191 PT Time Calculation (min) (ACUTE ONLY): 18 min  Charges:                       G Codes:  Danielle DessSarah Earnest Mcgillis, PTA 07/07/2015, 9:46 AM

## 2015-07-13 NOTE — Clinical Social Work Note (Signed)
Pt is ready for discharge. [facility], pt's primary choice, had a bed become available. CSW updated family and they would like to go to [facility]. LOG was approved as [employer] Auth still pending. [name] able to accept pt at discharge as they have received discharge information. CSW updated [facility] regarding change in facility. [name] will have to start a new auth, even though [employer] [place] gave [place] ([id number]). [name] is aware. RN called report and EMS provided transporation. CSW singing off as no further needs identified.   Dede QuerySarah Emmalou Hunger, MSW, LCSW Clinical Social Worker [phone]

## 2015-07-13 NOTE — Clinical Social Work Placement (Signed)
   CLINICAL SOCIAL WORK PLACEMENT  NOTE  Date:  07/13/2015  Patient Details  Name: Sylvia Black MRN: [id number] Date of Birth: 06/05/1945  Clinical Social Work is seeking post-discharge placement for this patient at the Skilled  Nursing Facility level of care (*CSW will initial, date and re-position this form in  chart as items are completed):  Yes   Patient/family provided with [place] Clinical Social Work Department's list of facilities offering this level of care within the geographic area requested by the patient (or if unable, by the patient's family).  Yes   Patient/family informed of their freedom to choose among providers that offer the needed level of care, that participate in Medicare, Medicaid or managed care program needed by the patient, have an available bed and are willing to accept the patient.  Yes   Patient/family informed of [facility]'s ownership interest in [facility] and [facility], as well as of the fact that they are under no obligation to receive care at these facilities.  PASRR submitted to EDS on       PASRR number received on       Existing PASRR number confirmed on 07/03/15     FL2 transmitted to all facilities in geographic area requested by pt/family on 07/03/15     FL2 transmitted to all facilities within larger geographic area on       Patient informed that his/her managed care company has contracts with or will negotiate with certain facilities, including the following:        Yes   Patient/family informed of bed offers received.  Patient chooses bed at [facility]     Physician recommends and patient chooses bed at      Patient to be transferred to [facility] on 07/13/15.  Patient to be transferred to facility [facility]     Patient family notified on 07/07/15 of transfer.  Name of family member notified:  [name], daughter     PHYSICIAN Please sign FL2     Additional Comment:     _______________________________________________ Dede QuerySarah Brandolyn Shortridge, LCSW 07/13/2015, 3:04 PM

## 2015-08-06 ENCOUNTER — Inpatient Hospital Stay
Admission: EM | Admit: 2015-08-06 | Discharge: 2015-08-09 | DRG: 872 | Disposition: A | Discharge status: Home Health | Payer: Medicare HMO | Attending: Internal Medicine | Admitting: Internal Medicine

## 2015-08-06 ENCOUNTER — Encounter: Payer: Self-pay | Admitting: Emergency Medicine

## 2015-08-06 ENCOUNTER — Observation Stay: Payer: Medicare HMO

## 2015-08-06 DIAGNOSIS — Z794 Long term (current) use of insulin: Secondary | ICD-10-CM

## 2015-08-06 DIAGNOSIS — R112 Nausea with vomiting, unspecified: Secondary | ICD-10-CM | POA: Diagnosis present

## 2015-08-06 DIAGNOSIS — Z79899 Other long term (current) drug therapy: Secondary | ICD-10-CM

## 2015-08-06 DIAGNOSIS — F319 Bipolar disorder, unspecified: Secondary | ICD-10-CM | POA: Diagnosis present

## 2015-08-06 DIAGNOSIS — A419 Sepsis, unspecified organism: Secondary | ICD-10-CM | POA: Diagnosis not present

## 2015-08-06 DIAGNOSIS — Z880 Allergy status to penicillin: Secondary | ICD-10-CM

## 2015-08-06 DIAGNOSIS — Z6841 Body Mass Index (BMI) 40.0 and over, adult: Secondary | ICD-10-CM

## 2015-08-06 DIAGNOSIS — E162 Hypoglycemia, unspecified: Secondary | ICD-10-CM

## 2015-08-06 DIAGNOSIS — R404 Transient alteration of awareness: Secondary | ICD-10-CM

## 2015-08-06 DIAGNOSIS — N179 Acute kidney failure, unspecified: Secondary | ICD-10-CM | POA: Diagnosis present

## 2015-08-06 DIAGNOSIS — Z8249 Family history of ischemic heart disease and other diseases of the circulatory system: Secondary | ICD-10-CM

## 2015-08-06 DIAGNOSIS — Z7902 Long term (current) use of antithrombotics/antiplatelets: Secondary | ICD-10-CM

## 2015-08-06 DIAGNOSIS — I10 Essential (primary) hypertension: Secondary | ICD-10-CM | POA: Diagnosis present

## 2015-08-06 DIAGNOSIS — F419 Anxiety disorder, unspecified: Secondary | ICD-10-CM | POA: Diagnosis present

## 2015-08-06 DIAGNOSIS — E785 Hyperlipidemia, unspecified: Secondary | ICD-10-CM | POA: Diagnosis present

## 2015-08-06 DIAGNOSIS — N390 Urinary tract infection, site not specified: Secondary | ICD-10-CM | POA: Diagnosis present

## 2015-08-06 DIAGNOSIS — E875 Hyperkalemia: Secondary | ICD-10-CM | POA: Diagnosis present

## 2015-08-06 DIAGNOSIS — E11649 Type 2 diabetes mellitus with hypoglycemia without coma: Secondary | ICD-10-CM | POA: Diagnosis present

## 2015-08-06 DIAGNOSIS — Z993 Dependence on wheelchair: Secondary | ICD-10-CM

## 2015-08-06 DIAGNOSIS — R197 Diarrhea, unspecified: Secondary | ICD-10-CM | POA: Diagnosis present

## 2015-08-06 DIAGNOSIS — G4733 Obstructive sleep apnea (adult) (pediatric): Secondary | ICD-10-CM | POA: Diagnosis present

## 2015-08-06 DIAGNOSIS — I69354 Hemiplegia and hemiparesis following cerebral infarction affecting left non-dominant side: Secondary | ICD-10-CM

## 2015-08-06 DIAGNOSIS — L120 Bullous pemphigoid: Secondary | ICD-10-CM | POA: Diagnosis present

## 2015-08-06 DIAGNOSIS — E1165 Type 2 diabetes mellitus with hyperglycemia: Secondary | ICD-10-CM | POA: Diagnosis present

## 2015-08-06 DIAGNOSIS — T383X1A Poisoning by insulin and oral hypoglycemic [antidiabetic] drugs, accidental (unintentional), initial encounter: Secondary | ICD-10-CM | POA: Diagnosis present

## 2015-08-06 DIAGNOSIS — Z95828 Presence of other vascular implants and grafts: Secondary | ICD-10-CM

## 2015-08-06 DIAGNOSIS — Z87440 Personal history of urinary (tract) infections: Secondary | ICD-10-CM

## 2015-08-06 DIAGNOSIS — Z833 Family history of diabetes mellitus: Secondary | ICD-10-CM

## 2015-08-06 DIAGNOSIS — Z887 Allergy status to serum and vaccine status: Secondary | ICD-10-CM

## 2015-08-06 DIAGNOSIS — K219 Gastro-esophageal reflux disease without esophagitis: Secondary | ICD-10-CM | POA: Diagnosis present

## 2015-08-06 DIAGNOSIS — E7800 Pure hypercholesterolemia, unspecified: Secondary | ICD-10-CM | POA: Diagnosis present

## 2015-08-06 DIAGNOSIS — Y92009 Unspecified place in unspecified non-institutional (private) residence as the place of occurrence of the external cause: Secondary | ICD-10-CM

## 2015-08-06 DIAGNOSIS — Z882 Allergy status to sulfonamides status: Secondary | ICD-10-CM

## 2015-08-06 DIAGNOSIS — Z7952 Long term (current) use of systemic steroids: Secondary | ICD-10-CM

## 2015-08-06 DIAGNOSIS — E119 Type 2 diabetes mellitus without complications: Secondary | ICD-10-CM

## 2015-08-06 HISTORY — DX: Bullous pemphigoid: L12.0

## 2015-08-06 LAB — CBC
HCT: 31.9 % — ABNORMAL LOW (ref 35.0–47.0)
HEMOGLOBIN: 10.8 g/dL — AB (ref 12.0–16.0)
MCH: 29.7 pg (ref 26.0–34.0)
MCHC: 34 g/dL (ref 32.0–36.0)
MCV: 87.5 fL (ref 80.0–100.0)
Platelets: 314 10*3/uL (ref 150–440)
RBC: 3.65 MIL/uL — AB (ref 3.80–5.20)
RDW: 14.9 % — ABNORMAL HIGH (ref 11.5–14.5)
WBC: 8.4 10*3/uL (ref 3.6–11.0)

## 2015-08-06 LAB — COMPREHENSIVE METABOLIC PANEL
ALBUMIN: 2.2 g/dL — AB (ref 3.5–5.0)
ALK PHOS: 123 U/L (ref 38–126)
ALT: 21 U/L (ref 14–54)
ANION GAP: 7 (ref 5–15)
AST: 36 U/L (ref 15–41)
BUN: 14 mg/dL (ref 6–20)
CALCIUM: 7.8 mg/dL — AB (ref 8.9–10.3)
CO2: 28 mmol/L (ref 22–32)
CREATININE: 0.6 mg/dL (ref 0.44–1.00)
Chloride: 102 mmol/L (ref 101–111)
GFR calc Af Amer: >60 mL/min (ref >60)
GFR calc non Af Amer: >60 mL/min (ref >60)
GLUCOSE: 187 mg/dL — AB (ref 65–99)
Potassium: 2.8 mmol/L — ABNORMAL LOW (ref 3.5–5.1)
SODIUM: 137 mmol/L (ref 135–145)
Total Bilirubin: 0.6 mg/dL (ref 0.3–1.2)
Total Protein: 5.5 g/dL — ABNORMAL LOW (ref 6.5–8.1)

## 2015-08-06 LAB — GLUCOSE, CAPILLARY
GLUCOSE-CAPILLARY: 85 mg/dL (ref 65–99)
Glucose-Capillary: 185 mg/dL — ABNORMAL HIGH (ref 65–99)
Glucose-Capillary: 224 mg/dL — ABNORMAL HIGH (ref 65–99)
Glucose-Capillary: 248 mg/dL — ABNORMAL HIGH (ref 65–99)
Glucose-Capillary: 215 mg/dL — ABNORMAL HIGH (ref 65–99)
Glucose-Capillary: 169 mg/dL — ABNORMAL HIGH (ref 65–99)
Glucose-Capillary: 89 mg/dL (ref 65–99)
Glucose-Capillary: 131 mg/dL — ABNORMAL HIGH (ref 65–99)
Glucose-Capillary: 126 mg/dL — ABNORMAL HIGH (ref 65–99)

## 2015-08-06 MED ORDER — ACETAMINOPHEN 325 MG PO TABS: 650.0000 mg | ORAL_TABLET | Freq: Four times a day (QID) | ORAL | Status: DC | PRN

## 2015-08-06 MED ORDER — CLOPIDOGREL BISULFATE 75 MG PO TABS
75.0000 mg | ORAL_TABLET | Freq: Every day | ORAL | Status: DC
  Administered 2015-08-06 – 2015-08-09 (×4): 75 mg via ORAL
  Filled 2015-08-06 (×4): qty 1

## 2015-08-06 MED ORDER — CLONAZEPAM 1 MG PO TABS
2.0000 mg | ORAL_TABLET | Freq: Every day | ORAL | Status: DC
  Administered 2015-08-06: 23:00:00 2 mg via ORAL
  Filled 2015-08-06: qty 2

## 2015-08-06 MED ORDER — PREDNISONE 20 MG PO TABS
40.0000 mg | ORAL_TABLET | Freq: Every day | ORAL | Status: DC
  Administered 2015-08-07 – 2015-08-08 (×2): 40 mg via ORAL
  Filled 2015-08-06 (×2): qty 2

## 2015-08-06 MED ORDER — ONDANSETRON HCL 4 MG PO TABS: 4.0000 mg | ORAL_TABLET | Freq: Four times a day (QID) | ORAL | Status: DC | PRN

## 2015-08-06 MED ORDER — METOCLOPRAMIDE HCL 5 MG/ML IJ SOLN
10.0000 mg | Freq: Three times a day (TID) | INTRAMUSCULAR | Status: DC
  Administered 2015-08-06 – 2015-08-08 (×5): 10 mg via INTRAVENOUS
  Filled 2015-08-06 (×5): qty 2

## 2015-08-06 MED ORDER — ENOXAPARIN SODIUM 40 MG/0.4ML ~~LOC~~ SOLN
40.0000 mg | Freq: Two times a day (BID) | SUBCUTANEOUS | Status: DC
  Administered 2015-08-06 – 2015-08-09 (×6): 40 mg via SUBCUTANEOUS
  Filled 2015-08-06 (×6): qty 0.4

## 2015-08-06 MED ORDER — LISINOPRIL 20 MG PO TABS
40.0000 mg | ORAL_TABLET | Freq: Every day | ORAL | Status: DC
Start: 2015-08-06 — End: 2015-08-07
  Administered 2015-08-06: 23:00:00 40 mg via ORAL
  Filled 2015-08-06 (×2): qty 2

## 2015-08-06 MED ORDER — TIZANIDINE HCL 4 MG PO TABS
8.0000 mg | ORAL_TABLET | Freq: Every evening | ORAL | Status: DC
  Administered 2015-08-06: 23:00:00 8 mg via ORAL
  Filled 2015-08-06: qty 2

## 2015-08-06 MED ORDER — POTASSIUM CHLORIDE 20 MEQ PO PACK
40.0000 meq | PACK | Freq: Once | ORAL | Status: AC
  Administered 2015-08-06: 40 meq via ORAL

## 2015-08-06 MED ORDER — BUPROPION HCL ER (XL) 150 MG PO TB24
150.0000 mg | ORAL_TABLET | Freq: Three times a day (TID) | ORAL | Status: DC
  Administered 2015-08-06 – 2015-08-08 (×5): 150 mg via ORAL
  Filled 2015-08-06 (×6): qty 1

## 2015-08-06 MED ORDER — DICLOFENAC SODIUM 1 % TD GEL
2.0000 g | Freq: Four times a day (QID) | TRANSDERMAL | Status: DC
  Administered 2015-08-07 – 2015-08-09 (×6): 2 g via TOPICAL
  Filled 2015-08-06: qty 100

## 2015-08-06 MED ORDER — POTASSIUM CHLORIDE 20 MEQ PO PACK
40.0000 meq | PACK | Freq: Two times a day (BID) | ORAL | Status: DC
  Filled 2015-08-06: qty 2

## 2015-08-06 MED ORDER — TIZANIDINE HCL 4 MG PO TABS: 4.0000 mg | ORAL_TABLET | Freq: Every morning | ORAL | Status: DC

## 2015-08-06 MED ORDER — LIVING WELL WITH DIABETES BOOK
Freq: Once | Status: AC
  Administered 2015-08-06: 22:00:00
  Filled 2015-08-06: qty 1

## 2015-08-06 MED ORDER — METOPROLOL SUCCINATE ER 50 MG PO TB24
100.0000 mg | ORAL_TABLET | Freq: Every day | ORAL | Status: DC
  Administered 2015-08-06: 100 mg via ORAL
  Filled 2015-08-06 (×2): qty 2

## 2015-08-06 MED ORDER — ACETAMINOPHEN 650 MG RE SUPP: 650.0000 mg | Freq: Four times a day (QID) | RECTAL | Status: DC | PRN

## 2015-08-06 MED ORDER — INSULIN ASPART 100 UNIT/ML ~~LOC~~ SOLN
0.0000 [IU] | Freq: Three times a day (TID) | SUBCUTANEOUS | Status: DC
  Administered 2015-08-07: 11 [IU] via SUBCUTANEOUS
  Administered 2015-08-07: 12:00:00 3 [IU] via SUBCUTANEOUS
  Administered 2015-08-08 (×2): 7 [IU] via SUBCUTANEOUS
  Administered 2015-08-08: 11 [IU] via SUBCUTANEOUS
  Administered 2015-08-09 (×2): 4 [IU] via SUBCUTANEOUS
  Filled 2015-08-06: qty 11
  Filled 2015-08-06: qty 4
  Filled 2015-08-06: qty 11
  Filled 2015-08-06: qty 7
  Filled 2015-08-06: qty 3
  Filled 2015-08-06: qty 7
  Filled 2015-08-06: qty 4

## 2015-08-06 MED ORDER — INSULIN ASPART 100 UNIT/ML ~~LOC~~ SOLN
0.0000 [IU] | Freq: Every day | SUBCUTANEOUS | Status: DC
  Administered 2015-08-07: 23:00:00 4 [IU] via SUBCUTANEOUS
  Administered 2015-08-08: 5 [IU] via SUBCUTANEOUS
  Filled 2015-08-06: qty 5
  Filled 2015-08-06: qty 4

## 2015-08-06 MED ORDER — LIDOCAINE 5 % EX PTCH
1.0000 | MEDICATED_PATCH | CUTANEOUS | Status: DC
  Administered 2015-08-07 – 2015-08-08 (×2): 1 via TRANSDERMAL
  Filled 2015-08-06 (×4): qty 1

## 2015-08-06 MED ORDER — CLOBETASOL PROPIONATE 0.05 % EX CREA
1.0000 "application " | TOPICAL_CREAM | Freq: Two times a day (BID) | CUTANEOUS | Status: DC
  Administered 2015-08-07 – 2015-08-09 (×4): 1 via TOPICAL
  Filled 2015-08-06: qty 15

## 2015-08-06 MED ORDER — AMLODIPINE BESYLATE 10 MG PO TABS
10.0000 mg | ORAL_TABLET | Freq: Every day | ORAL | Status: DC
  Administered 2015-08-06: 10 mg via ORAL
  Filled 2015-08-06 (×2): qty 1

## 2015-08-06 MED ORDER — ONDANSETRON HCL 4 MG/2ML IJ SOLN
4.0000 mg | Freq: Four times a day (QID) | INTRAMUSCULAR | Status: DC | PRN
  Administered 2015-08-06: 20:00:00 4 mg via INTRAVENOUS
  Filled 2015-08-06 (×3): qty 2

## 2015-08-06 MED ORDER — ONDANSETRON HCL 4 MG/2ML IJ SOLN
4.0000 mg | Freq: Once | INTRAMUSCULAR | Status: AC
  Administered 2015-08-06: 4 mg via INTRAVENOUS
  Filled 2015-08-06: qty 2

## 2015-08-06 NOTE — ED Notes (Signed)
Patient given meal try to see if she is able to tolerate food.

## 2015-08-06 NOTE — ED Notes (Signed)
Patient brought in by ems from home. Patient was found unresponsive with fsbs of 42. Patient was given glugacon and amp of D50 and fsbs increased to 250. Ems reports that they have been called out twice in 24 hours for the same. Patient reports that she has had nausea, vomiting and diarrhea times three days. Patient reports that she has increased her lantus insulin per her mds orders.

## 2015-08-06 NOTE — ED Provider Notes (Addendum)
-----------------------------------------   8:36 AM on 08/06/2015 -----------------------------------------   Blood pressure 97/76, pulse 62, temperature 97.5 F (36.4 C), temperature source Axillary, resp. rate 16, height 5\' 3"  (1.6 m), weight 260 lb (117.935 kg), SpO2 99 %.  Assuming care from Dr. Manson PasseyBrown.  In short, Sylvia Black is a 70 y.o. female with a chief complaint of Hypoglycemia; Emesis; and Diarrhea  In short, this is a 70F who presented after being found unresponsive with a glucose of 42. Patient started taking a higher dose of Lantus last night and she was started on prednisone for her bolus benefit going. She reports she's been having diarrhea and poor by mouth intake. Patient received D50 per EMS.   The current plan of care is to observe for 4 hours and if patient remains with normal glucose and tolerating by mouth we'll discharge home.   _________________________ 3:10 PM on 08/06/2015 -----------------------------------------  Patient's glucose now 89 after eating and having ice cream. I am afraid patient will have another hypoglycemic episode as sugars are not improving with PO meals. According to patient she has not changed her lantus yet and has been on 60U daily for over one year. Will admit for observation  Nita Sicklearolina Ardythe Klute, MD 08/06/15 1512  Nita Sicklearolina Zissy Hamlett, MD 08/06/15 1513

## 2015-08-06 NOTE — ED Notes (Signed)
Patient given ice cream cup

## 2015-08-06 NOTE — ED Notes (Signed)
Patient daughter Sylvia ChestnutChrystal [phone]

## 2015-08-06 NOTE — H&P (Signed)
[employer] - [place] at [facility]   PATIENT NAME: Sylvia Black    MR#:  [id number]  DATE OF BIRTH:  [birth year]  DATE OF ADMISSION:  08/06/2015  PRIMARY CARE PHYSICIAN: Emogene Morgan, MD   REQUESTING/REFERRING PHYSICIAN: Dr. Cecil Cobbs  CHIEF COMPLAINT:   Chief Complaint  Patient presents with  . Hypoglycemia  . Emesis  . Diarrhea    HISTORY OF PRESENT ILLNESS:  Sylvia Black  is a 70 y.o. female with a known history of Hypertension, diabetes, history of previous UTIs, obstructive sleep apnea, morbid obesity, previous CVA, chronic pain, gout, anxiety who presents to the hospital as she was found unresponsive and to be altered. Patient's family checked her blood sugar and she was noted to be hypoglycemic in the 40s. EMS was called and they give her some dextrose and her blood sugars improved. Patient has had 2 episodes of hypoglycemia and unresponsiveness since yesterday and therefore was brought to the ER for further evaluation. Patient was discharged from the hospital about a month or so ago on 30 units of Lantus daily. According to her family they have been giving her 60 units of Lantus at home. Patient on her previous hospitalization in June was here due to diabetic ketoacidosis and therefore her insulin was adjusted upon discharge, but she was sent to a skilled nursing facility and recently discharge from skilled nursing facility and the daughter has been giving her the higher dose since she's been home. They have not been checking her sugars on a regular basis until the past day when she was lethargic and unresponsive. As per the daughter patient's by mouth intake has been adequate but she has been having some intractable nausea and vomiting for the past few days. Patient was given some dextrose and also given something to eat but her blood sugar still remains somewhat on the low side and therefore hospitalist services were contacted for further treatment and  evaluation.  PAST MEDICAL HISTORY:   Past Medical History  Diagnosis Date  . Hypertension   . Frequent UTI   . PONV (postoperative nausea and vomiting)     "makes me violently ill" (02/22/2013)  . Family history of anesthesia complication     "daughter's w/PONV" (02/22/2013)  . High cholesterol   . Type II diabetes mellitus (HCC)   . Pneumonia 1990's    "once"  . Sleep apnea     "dx'd in the 1990's; don't wear mask" (02/22/2013)  . GERD (gastroesophageal reflux disease)   . Stroke [facility] 2010; 02/2012    denies residual; "balance issues since & weak on left side since" (02/22/2013)  . 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 Gout attack     "twice" (02/22/2013)  . Anxiety   . Depression   . Bipolar disorder (HCC)   . Vertigo   . Bullous pemphigoid     PAST SURGICAL HISTORY:   Past Surgical History  Procedure Laterality Date  . Posterior fusion cervical spine  ~ 1988  . Anterior cervical decomp/discectomy fusion  1989  . Cholecystectomy  1984  . Appendectomy  1984  . Tubal ligation  1973  . Wrist surgery Right ~ 1985    "tied off damagaed nerve" (02/22/2013)  . Cataract extraction w/ intraocular lens  implant, bilateral Bilateral ~ 2008    SOCIAL HISTORY:   Social History  Substance Use Topics  . Smoking status: Passive Smoke Exposure - Never Smoker  .  Smokeless tobacco: Never Used  . Alcohol Use: Yes     Comment: 02/22/2013 "mixed drink on special occasions; glass of wine q 2-3 months"    FAMILY HISTORY:   Family History  Problem Relation Age of Onset  . Heart disease Father   . Heart disease Mother   . Diabetes Mother     DRUG ALLERGIES:   Allergies  Allergen Reactions  . Ciprofloxacin     Other reaction(s): Hallucinations  . Penicillins Hives    Has patient had a PCN reaction causing immediate rash, facial/tongue/throat swelling, SOB or lightheadedness with hypotension: Yes Has patient had a PCN reaction causing severe rash  involving mucus membranes or skin necrosis: No Has patient had a PCN reaction that required hospitalization Yes Has patient had a PCN reaction occurring within the last 10 years: No If all of the above answers are "NO", then may proceed with Cephalosporin use.  . Sulfa Antibiotics     "hives"  . Tetanus Toxoids     "I had a strong reaction".    REVIEW OF SYSTEMS:   Review of Systems  Constitutional: Negative for fever and weight loss.  HENT: Negative for congestion, nosebleeds and tinnitus.   Eyes: Negative for blurred vision, double vision and redness.  Respiratory: Negative for cough, hemoptysis and shortness of breath.   Cardiovascular: Negative for chest pain, orthopnea, leg swelling and PND.  Gastrointestinal: Positive for nausea and vomiting. Negative for diarrhea and melena.  Genitourinary: Negative for dysuria, urgency and hematuria.  Musculoskeletal: Negative for joint pain and falls.  Neurological: Positive for weakness (generalized). Negative for dizziness, tingling, sensory change, focal weakness, seizures and headaches.  Endo/Heme/Allergies: Negative for polydipsia. Does not bruise/bleed easily.  Psychiatric/Behavioral: Negative for depression and memory loss. The patient is not nervous/anxious.     MEDICATIONS AT HOME:   Prior to Admission medications   Medication Sig Start Date End Date Taking? Authorizing Provider  amLODipine (NORVASC) 10 MG tablet Take 10 mg by mouth daily.   Yes Historical Provider, MD  buPROPion (WELLBUTRIN XL) 150 MG 24 hr tablet Take 150 mg by mouth 3 (three) times daily.   Yes Historical Provider, MD  clobetasol cream (TEMOVATE) 0.05 % Apply 1 application topically daily as needed.   Yes Historical Provider, MD  clonazePAM (KLONOPIN) 1 MG tablet Take 2 mg by mouth at bedtime as needed for anxiety.   Yes Historical Provider, MD  clopidogrel (PLAVIX) 75 MG tablet Take 1 tablet (75 mg total) by mouth daily. 07/07/15  Yes Alford Highland, MD   diclofenac sodium (VOLTAREN) 1 % GEL Apply topically 4 (four) times daily.   Yes Historical Provider, MD  insulin glargine (LANTUS) 100 UNIT/ML injection Inject 60 Units into the skin daily.    Yes Historical Provider, MD  lidocaine (LIDODERM) 5 % Place 1 patch onto the skin daily. Remove & Discard patch within 12 hours or as directed by MD   Yes Historical Provider, MD  lisinopril (PRINIVIL,ZESTRIL) 40 MG tablet Take 1 tablet (40 mg total) by mouth daily. 07/07/15  Yes Alford Highland, MD  metoprolol succinate (TOPROL-XL) 100 MG 24 hr tablet Take 1 tablet (100 mg total) by mouth daily. Take with or immediately following a meal. 07/07/15  Yes Alford Highland, MD  predniSONE (DELTASONE) 10 MG tablet Take 40 mg by mouth daily with breakfast.   Yes Historical Provider, MD  tiZANidine (ZANAFLEX) 4 MG tablet Take 4-8 mg by mouth 2 (two) times daily. 4mg  in the morning and 8mg  at  night   Yes Historical Provider, MD  amLODipine (NORVASC) 5 MG tablet Take 1 tablet (5 mg total) by mouth daily. 07/07/15   Alford Highland, MD  ammonium lactate (LAC-HYDRIN) 12 % lotion Apply twice a day to feet bilaterally 07/07/15   Alford Highland, MD  atorvastatin (LIPITOR) 40 MG tablet Take 1 tablet (40 mg total) by mouth daily at 6 PM. 07/07/15   Alford Highland, MD  buPROPion (WELLBUTRIN XL) 300 MG 24 hr tablet Take 1 tablet (300 mg total) by mouth daily. 07/07/15   Alford Highland, MD  insulin aspart (NOVOLOG) 100 UNIT/ML injection Inject 4 Units into the skin 3 (three) times daily with meals. 07/07/15   Alford Highland, MD  insulin glargine (LANTUS) 100 UNIT/ML injection Inject 0.3 mLs (30 Units total) into the skin daily. 07/07/15   Alford Highland, MD  metoCLOPramide (REGLAN) 10 MG tablet Take 1 tablet (10 mg total) by mouth 4 (four) times daily -  before meals and at bedtime. 07/07/15   Alford Highland, MD  predniSONE (DELTASONE) 5 MG tablet Take 1 tablet (5 mg total) by mouth daily with breakfast. 07/07/15   Alford Highland,  MD      VITAL SIGNS:  Blood pressure 161/50, pulse 85, temperature 97.5 F (36.4 C), temperature source Axillary, resp. rate 18, height 5\' 3"  (1.6 m), weight 117.935 kg (260 lb), SpO2 94 %.  PHYSICAL EXAMINATION:  Physical Exam  GENERAL:  70 y.o.-year-old obese patient lying in the bed in no acute distress.  EYES: Pupils equal, round, reactive to light and accommodation. No scleral icterus. Extraocular muscles intact.  HEENT: Head atraumatic, normocephalic. Oropharynx and nasopharynx clear. No oropharyngeal erythema, moist oral mucosa  NECK:  Supple, no jugular venous distention. No thyroid enlargement, no tenderness.  LUNGS: Normal breath sounds bilaterally, no wheezing, rales, rhonchi. No use of accessory muscles of respiration.  CARDIOVASCULAR: S1, S2 RRR. No murmurs, rubs, gallops, clicks.  ABDOMEN: Soft, nontender, nondistended. Bowel sounds present. No organomegaly or mass.  EXTREMITIES: +1-2 edema b/l, No cyanosis, or clubbing. + 2 pedal & radial pulses b/l.   NEUROLOGIC: Cranial nerves II through XII are intact. No focal Motor or sensory deficits appreciated b/l PSYCHIATRIC: The patient is alert and oriented x 3. Good affect.  SKIN: No obvious rash, lesion, or ulcer.   LABORATORY PANEL:   CBC  Recent Labs Lab 08/06/15 0446  WBC 8.4  HGB 10.8*  HCT 31.9*  PLT 314   ------------------------------------------------------------------------------------------------------------------  Chemistries   Recent Labs Lab 08/06/15 0446  NA 137  K 2.8*  CL 102  CO2 28  GLUCOSE 187*  BUN 14  CREATININE 0.60  CALCIUM 7.8*  AST 36  ALT 21  ALKPHOS 123  BILITOT 0.6   ------------------------------------------------------------------------------------------------------------------  Cardiac Enzymes No results for input(s): TROPONINI in the last 168  hours. ------------------------------------------------------------------------------------------------------------------  RADIOLOGY:  No results found.   IMPRESSION AND PLAN:   70 year old female with past medical history of diabetes, hypertension, morbid obesity, obstructive sleep apnea, chronic pain, gout, anxiety/depression, bipolar disorder, history of bullous pemphigoid, hyperlipidemia presents to the hospital due to altered mental status and noted to be hypoglycemic.  1. Hypoglycemia-patient's sugars were in the 40s. There is improved with some dextrose and as patient has eaten into the 80s. This is likely secondary to the fact that she was taking high doses of Lantus than she was supposed to. -We will discontinue her Lantus for now, place on sliding scale insulin. -The diabetes coordinator consultation in the morning. Follow blood sugars  closely.  2. Nausea/vomiting-patient has been having some intractable nausea and vomiting now for the past few days. No abdominal pain, afebrile. -I suspect this could be diabetic gastroparesis. I will place on some scheduled Reglan and follow clinically.  3. History of bullous pemphigoid-continue maintenance prednisone.  4. Anxiety-continue Klonopin.  5. Essential hypertension-continue lisinopril, metoprolol, Norvasc.  6. History of previous CVA-continue Plavix.  All the records are reviewed and case discussed with ED provider. Management plans discussed with the patient, family and they are in agreement.  CODE STATUS: Full  TOTAL TIME TAKING CARE OF THIS PATIENT: 45 minutes.    Houston SirenSAINANI,Horton Ellithorpe J M.D on 08/06/2015 at 4:07 PM  Between 7am to 6pm - Pager - [phone]  After 6pm go to [email or web address] - password [email or web address] [facility]  [facility]  Office  [phone]  CC: Primary care physician; Emogene MorganAYCOCK, NGWE A, MD

## 2015-08-06 NOTE — Progress Notes (Signed)
Anticoagulation monitoring(Lovenox):  70 yo  ordered Lovenox 40 mg Q24h  Filed Weights   08/06/15 0439  Weight: 260 lb (117.935 kg)   BMI 46 Lab Results  Component Value Date   CREATININE 0.60 08/06/2015   CREATININE 0.47 07/06/2015   CREATININE 0.58 07/05/2015   Estimated Creatinine Clearance: 83.5 mL/min (by C-G formula based on Cr of 0.6). Hemoglobin & Hematocrit     Component Value Date/Time   HGB 10.8* 08/06/2015 0446   HGB 10.6* 10/01/2013 0416   HCT 31.9* 08/06/2015 0446   HCT 33.3* 10/01/2013 0416     Per Protocol for Patient with estCrcl > 30 ml/min and BMI >  40, will transition to Lovenox 40 mg Q12h.

## 2015-08-06 NOTE — Care Management Obs Status (Signed)
MEDICARE OBSERVATION STATUS NOTIFICATION   Patient Details  Name: Sylvia Black MRN: [id number] Date of Birth: 05/09/1945   Medicare Observation Status Notification Given:  Yes    Caren MacadamMichelle Daniella Dewberry, RN 08/06/2015, 5:05 PM

## 2015-08-06 NOTE — ED Provider Notes (Signed)
[facility] Emergency Department Provider Note  ____________________________________________  Time seen: 4:45 AM  I have reviewed the triage vital signs and the nursing notes.   HISTORY  Chief Complaint Hypoglycemia; Emesis; and Diarrhea      HPI Sylvia Black is a 70 y.o. female presents via EMS after being found unresponsive by family members at home with a glucose of 42. Patient was given glucagon and D50 in route with resultant glucose 250. 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 However patient states that she has had very poor by mouth intake as well as well as diarrhea.     Past Medical History  Diagnosis Date  . Hypertension   . Frequent UTI   . PONV (postoperative nausea and vomiting)     "makes me violently ill" (02/22/2013)  . Family history of anesthesia complication     "daughter's w/PONV" (02/22/2013)  . High cholesterol   . Type II diabetes mellitus (HCC)   . Pneumonia 1990's    "once"  . Sleep apnea     "dx'd in the 1990's; don't wear mask" (02/22/2013)  . GERD (gastroesophageal reflux disease)   . Stroke [facility]) 2010; 02/2012    denies residual; "balance issues since & weak on left side since" (02/22/2013)  . 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 Gout attack     "twice" (02/22/2013)  . Anxiety   . Depression   . Bipolar disorder (HCC)   . Vertigo     Patient Active Problem List   Diagnosis Date Noted  . Depression 07/04/2015  . Altered mental status   . Diabetic ketoacidosis without coma associated with type 2 diabetes mellitus (HCC)   . Encounter for central line placement   . Acidosis 07/01/2015  . Stroke (HCC)   . Hypertension   . Frequent UTI   . Diabetes mellitus without complication (HCC)   . Hypoglycemia 02/21/2013    Past Surgical  History  Procedure Laterality Date  . Posterior fusion cervical spine  ~ 1988  . Anterior cervical decomp/discectomy fusion  1989  . Cholecystectomy  1984  . Appendectomy  1984  . Tubal ligation  1973  . Wrist surgery Right ~ 1985    "tied off damagaed nerve" (02/22/2013)  . Cataract extraction w/ intraocular lens  implant, bilateral Bilateral ~ 2008    Current Outpatient Rx  Name  Route  Sig  Dispense  Refill  . amLODipine (NORVASC) 5 MG tablet   Oral   Take 1 tablet (5 mg total) by mouth daily.   30 tablet   0   . ammonium lactate (LAC-HYDRIN) 12 % lotion      Apply twice a day to feet bilaterally   400 g   0   . atorvastatin (LIPITOR) 40 MG tablet   Oral   Take 1 tablet (40 mg total) by mouth daily at 6 PM.   30 tablet   0   . buPROPion (WELLBUTRIN XL) 300 MG 24 hr tablet   Oral   Take 1 tablet (300 mg total) by mouth daily.   30 tablet   0   . clopidogrel (PLAVIX) 75 MG tablet   Oral   Take 1 tablet (75 mg total) by mouth daily.   30 tablet   0   .  insulin aspart (NOVOLOG) 100 UNIT/ML injection   Subcutaneous   Inject 4 Units into the skin 3 (three) times daily with meals.   10 mL   0   . insulin glargine (LANTUS) 100 UNIT/ML injection   Subcutaneous   Inject 0.3 mLs (30 Units total) into the skin daily.   10 mL   0   . lisinopril (PRINIVIL,ZESTRIL) 40 MG tablet   Oral   Take 1 tablet (40 mg total) by mouth daily.   30 tablet   0   . metoCLOPramide (REGLAN) 10 MG tablet   Oral   Take 1 tablet (10 mg total) by mouth 4 (four) times daily -  before meals and at bedtime.   120 tablet   0   . metoprolol succinate (TOPROL-XL) 100 MG 24 hr tablet   Oral   Take 1 tablet (100 mg total) by mouth daily. Take with or immediately following a meal.   30 tablet   0   . predniSONE (DELTASONE) 5 MG tablet   Oral   Take 1 tablet (5 mg total) by mouth daily with breakfast.   30 tablet   0     Allergies Ciprofloxacin; Penicillins; Sulfa antibiotics;  and Tetanus toxoids  No family history on file.  Social History Social History  Substance Use Topics  . Smoking status: Passive Smoke Exposure - Never Smoker  . Smokeless tobacco: Never Used  . Alcohol Use: Yes     Comment: 02/22/2013 "mixed drink on special occasions; glass of wine q 2-3 months"    Review of Systems  Constitutional: Negative for fever. Eyes: Negative for visual changes. ENT: Negative for sore throat. Cardiovascular: Negative for chest pain. Respiratory: Negative for shortness of breath. Gastrointestinal: Negative for abdominal pain, vomiting and diarrhea. Genitourinary: Negative for dysuria. Musculoskeletal: Negative for back pain. Skin: Negative for rash. Neurological: Negative for headaches, focal weakness or numbness.   10-point ROS otherwise negative.  ____________________________________________   PHYSICAL EXAM:  VITAL SIGNS: ED Triage Vitals  Enc Vitals Group     BP 08/06/15 0438 111/52 mmHg     Pulse Rate 08/06/15 0438 68     Resp 08/06/15 0438 18     Temp 08/06/15 0445 97.5 F (36.4 C)     Temp Source 08/06/15 0445 Axillary     SpO2 08/06/15 0438 93 %     Weight 08/06/15 0439 260 lb (117.935 kg)     Height 08/06/15 0438 5\' 3"  (1.6 m)     Head Cir --      Peak Flow --      Pain Score 08/06/15 0440 5     Pain Loc --      Pain Edu? --      Excl. in GC? --     Constitutional: Alert and oriented. Well appearing and in no distress. Eyes: Conjunctivae are normal. PERRL. Normal extraocular movements. ENT   Head: Normocephalic and atraumatic.   Nose: No congestion/rhinnorhea.   Mouth/Throat: Mucous membranes are moist.   Neck: No stridor. Hematological/Lymphatic/Immunilogical: No cervical lymphadenopathy. Cardiovascular: Normal rate, regular rhythm. Normal and symmetric distal pulses are present in all extremities. No murmurs, rubs, or gallops. Respiratory: Normal respiratory effort without tachypnea nor retractions. Breath  sounds are clear and equal bilaterally. No wheezes/rales/rhonchi. Gastrointestinal: Soft and nontender. No distention. There is no CVA tenderness. Genitourinary: deferred Musculoskeletal: Nontender with normal range of motion in all extremities. No joint effusions.  No lower extremity tenderness nor edema. Neurologic:  Normal speech and  language. No gross focal neurologic deficits are appreciated. Speech is normal.  Skin:  Skin is warm, dry and intact. No rash noted. Psychiatric: Mood and affect are normal. Speech and behavior are normal. Patient exhibits appropriate insight and judgment.  ____________________________________________    LABS (pertinent positives/negatives)  Labs Reviewed  GLUCOSE, CAPILLARY - Abnormal; Notable for the following:    Glucose-Capillary >600 (*)    All other components within normal limits  GLUCOSE, CAPILLARY - Abnormal; Notable for the following:    Glucose-Capillary 185 (*)    All other components within normal limits  GLUCOSE, CAPILLARY - Abnormal; Notable for the following:    Glucose-Capillary 224 (*)    All other components within normal limits  GLUCOSE, CAPILLARY - Abnormal; Notable for the following:    Glucose-Capillary 248 (*)    All other components within normal limits  CBC - Abnormal; Notable for the following:    RBC 3.65 (*)    Hemoglobin 10.8 (*)    HCT 31.9 (*)    RDW 14.9 (*)    All other components within normal limits  COMPREHENSIVE METABOLIC PANEL - Abnormal; Notable for the following:    Potassium 2.8 (*)    Glucose, Bld 187 (*)    Calcium 7.8 (*)    Total Protein 5.5 (*)    Albumin 2.2 (*)    All other components within normal limits      Procedures     INITIAL IMPRESSION / ASSESSMENT AND PLAN / ED COURSE  Pertinent labs & imaging results that were available during my care of the patient were reviewed by me and considered in my medical decision making (see chart for details).  Patient with hypoglycemia received  glucagon and D50 from EMS. Patient's care be transferred to Dr. Merdis Delay with recommendation to admit the patient if glucose decrease again  ____________________________________________   FINAL CLINICAL IMPRESSION(S) / ED DIAGNOSES  Final diagnoses:  Hypoglycemia      Darci Current, MD 08/07/15 [phone]

## 2015-08-07 ENCOUNTER — Observation Stay: Payer: Medicare HMO

## 2015-08-07 LAB — URINALYSIS COMPLETE WITH MICROSCOPIC (ARMC ONLY)
BILIRUBIN URINE: NEGATIVE
Glucose, UA: NEGATIVE mg/dL
Nitrite: POSITIVE — AB
PROTEIN: 100 mg/dL — AB
Specific Gravity, Urine: 1.016 (ref 1.005–1.030)
pH: 5 (ref 5.0–8.0)

## 2015-08-07 LAB — CBC
HCT: 32.5 % — ABNORMAL LOW (ref 35.0–47.0)
Hemoglobin: 10.9 g/dL — ABNORMAL LOW (ref 12.0–16.0)
MCH: 29.1 pg (ref 26.0–34.0)
MCHC: 33.5 g/dL (ref 32.0–36.0)
MCV: 86.8 fL (ref 80.0–100.0)
PLATELETS: 254 10*3/uL (ref 150–440)
RBC: 3.74 MIL/uL — ABNORMAL LOW (ref 3.80–5.20)
RDW: 15.3 % — AB (ref 11.5–14.5)
WBC: 12.8 10*3/uL — AB (ref 3.6–11.0)

## 2015-08-07 LAB — BASIC METABOLIC PANEL
ANION GAP: 8 (ref 5–15)
ANION GAP: 7 (ref 5–15)
BUN: 18 mg/dL (ref 6–20)
BUN: 20 mg/dL (ref 6–20)
CALCIUM: 7.6 mg/dL — AB (ref 8.9–10.3)
CO2: 22 mmol/L (ref 22–32)
CO2: 25 mmol/L (ref 22–32)
CREATININE: 1 mg/dL (ref 0.44–1.00)
Calcium: 7.7 mg/dL — ABNORMAL LOW (ref 8.9–10.3)
Chloride: 107 mmol/L (ref 101–111)
Chloride: 102 mmol/L (ref 101–111)
Creatinine, Ser: 0.8 mg/dL (ref 0.44–1.00)
GFR calc Af Amer: >60 mL/min (ref >60)
GFR, EST NON AFRICAN AMERICAN: 57 mL/min — AB (ref >60)
GLUCOSE: 210 mg/dL — AB (ref 65–99)
Glucose, Bld: 105 mg/dL — ABNORMAL HIGH (ref 65–99)
POTASSIUM: 4.4 mmol/L (ref 3.5–5.1)
Potassium: 5.9 mmol/L — ABNORMAL HIGH (ref 3.5–5.1)
SODIUM: 137 mmol/L (ref 135–145)
Sodium: 134 mmol/L — ABNORMAL LOW (ref 135–145)

## 2015-08-07 LAB — GLUCOSE, CAPILLARY
GLUCOSE-CAPILLARY: 346 mg/dL — AB (ref 65–99)
Glucose-Capillary: 83 mg/dL (ref 65–99)
Glucose-Capillary: 127 mg/dL — ABNORMAL HIGH (ref 65–99)
Glucose-Capillary: 265 mg/dL — ABNORMAL HIGH (ref 65–99)

## 2015-08-07 MED ORDER — NICOTINE 14 MG/24HR TD PT24
14.0000 mg | MEDICATED_PATCH | Freq: Every day | TRANSDERMAL | Status: DC
  Filled 2015-08-07 (×2): qty 1

## 2015-08-07 MED ORDER — SODIUM CHLORIDE 0.9 % IV BOLUS (SEPSIS)
1000.0000 mL | Freq: Once | INTRAVENOUS | Status: AC
  Administered 2015-08-07: 05:00:00 1000 mL via INTRAVENOUS

## 2015-08-07 MED ORDER — SODIUM CHLORIDE 0.9 % IV SOLN
1.0000 g | Freq: Three times a day (TID) | INTRAVENOUS | Status: DC
  Administered 2015-08-07 – 2015-08-09 (×5): 1 g via INTRAVENOUS
  Filled 2015-08-07 (×7): qty 1

## 2015-08-07 MED ORDER — INSULIN GLARGINE 100 UNIT/ML ~~LOC~~ SOLN
10.0000 [IU] | Freq: Every day | SUBCUTANEOUS | Status: DC
  Administered 2015-08-07: 23:00:00 10 [IU] via SUBCUTANEOUS
  Filled 2015-08-07: qty 0.1

## 2015-08-07 MED ORDER — SODIUM CHLORIDE 0.9 % IV BOLUS (SEPSIS)
500.0000 mL | Freq: Once | INTRAVENOUS | Status: AC
  Administered 2015-08-07: 18:00:00 500 mL via INTRAVENOUS

## 2015-08-07 NOTE — Progress Notes (Signed)
Dr. Nemiah CommanderKalisetti informed of inability to get an IV on patient. MD came to see patient in person. Patient alert and oriented, pleasant, reports no complaints. MD ordered a PICC line to be placed to allow for IV access. Patient reports she has had them often in the past due to lack of IV access.

## 2015-08-07 NOTE — Progress Notes (Signed)
PICC line placed, x-ray confirmation back of adequate position, confirmed by PICC RN as well.

## 2015-08-07 NOTE — Clinical Social Work Note (Signed)
CSW consulted for a Hospital Bed. CSW udpated RNCM, RNCM will make arrangments at discharge. CSW will sign off at this time. Please reconsult if a need arises prior to discharge.   Sylvia QuerySarah Nahiara Black, MSW, LCSW  Clinical Social Worker  [phone]

## 2015-08-07 NOTE — Progress Notes (Signed)
Spoke to Dr. Nemiah CommanderKalisetti on the phone about patient's blood pressure. Patient alert and oriented at this time, able to hold conversation, reports no distress. MD ordered blood pressure medication held for today and a 500 mL bolus of NS to be given IV. Will continue to monitor.

## 2015-08-07 NOTE — Progress Notes (Signed)
[facility] [place] at [facility]   PATIENT NAME: Sylvia Black    MR#:  [id number]  DATE OF BIRTH:  18-Jan-1946  SUBJECTIVE:  CHIEF COMPLAINT:   Chief Complaint  Patient presents with  . Hypoglycemia  . Emesis  . Diarrhea   - Admitted with infusion and hyperglycemia and poor by mouth intake. -Sugars are improved. Off of D5 solution. But noted to be hypotensive today.  REVIEW OF SYSTEMS:  Review of Systems  Constitutional: Positive for malaise/fatigue. Negative for fever and chills.  HENT: Negative for ear discharge, ear pain and nosebleeds.   Eyes: Negative for blurred vision and double vision.  Respiratory: Negative for cough, shortness of breath and wheezing.   Cardiovascular: Positive for leg swelling. Negative for chest pain and palpitations.  Gastrointestinal: Negative for nausea, vomiting, abdominal pain, diarrhea and constipation.  Genitourinary: Negative for dysuria.  Musculoskeletal: Negative for myalgias.  Neurological: Negative for dizziness, sensory change, speech change, focal weakness, seizures and headaches.  Psychiatric/Behavioral: Negative for depression.    DRUG ALLERGIES:   Allergies  Allergen Reactions  . Ciprofloxacin     Other reaction(s): Hallucinations  . Penicillins Hives    Has patient had a PCN reaction causing immediate rash, facial/tongue/throat swelling, SOB or lightheadedness with hypotension: Yes Has patient had a PCN reaction causing severe rash involving mucus membranes or skin necrosis: No Has patient had a PCN reaction that required hospitalization Yes Has patient had a PCN reaction occurring within the last 10 years: No If all of the above answers are "NO", then may proceed with Cephalosporin use.  . Sulfa Antibiotics     "hives"  . Tetanus Toxoids     "I had a strong reaction".    VITALS:  Blood pressure 102/57, pulse 67, temperature 98.2 F (36.8 C), temperature source Oral, resp. rate 20, height   (1.6 m), weight 117.935 kg (260 lb), SpO2 99 %.  PHYSICAL EXAMINATION:  Physical Exam  GENERAL:  70 y.o.-year-old Obese patient lying in the bed with no acute distress. Not symptomatic with low blood pressure. EYES: Pupils equal, round, reactive to light and accommodation. No scleral icterus. Extraocular muscles intact.  HEENT: Head atraumatic, normocephalic. Oropharynx and nasopharynx clear.  NECK:  Supple, no jugular venous distention. No thyroid enlargement, no tenderness.  LUNGS: Normal breath sounds bilaterally, no wheezing, rales,rhonchi or crepitation. No use of accessory muscles of respiration. Increased bibasilar breath sounds. CARDIOVASCULAR: S1, S2 normal. No murmurs, rubs, or gallops.  ABDOMEN: Soft, nontender, nondistended. Bowel sounds present. No organomegaly or mass.  EXTREMITIES: No pedal edema, cyanosis, or clubbing. Cold hands to touch. Weak peripheral pulses on palpation. NEUROLOGIC: Cranial nerves II through XII are intact. Global weakness noted. Muscle strength 5/5 in RUE and 4/5 on LUE and 3/5 in left lower extremity and 4/5 on the right. Sensation intact. Gait not checked. Prior h/o CVA with left sided weakness noted. PSYCHIATRIC: The patient is alert and oriented x 3.  SKIN: No obvious rash, lesion, or ulcer. Scattered bruising noted on her arms.   LABORATORY PANEL:   CBC  Recent Labs Lab 08/07/15 0659  WBC 12.8*  HGB 10.9*  HCT 32.5*  PLT 254   ------------------------------------------------------------------------------------------------------------------  Chemistries   Recent Labs Lab 08/06/15 0446 08/07/15 0659  NA 137 137  K 2.8* 5.9*  CL 102 107  CO2 28 22  GLUCOSE 187* 105*  BUN 14 18  CREATININE 0.60 0.80  CALCIUM 7.8* 7.6*  AST 36  --  ALT 21  --   ALKPHOS 123  --   BILITOT 0.6  --    ------------------------------------------------------------------------------------------------------------------  Cardiac Enzymes No results  for input(s): TROPONINI in the last 168 hours. ------------------------------------------------------------------------------------------------------------------  RADIOLOGY:  Ct Head Wo Contrast  08/07/2015  CLINICAL DATA:  Transient altered awareness EXAM: CT HEAD WITHOUT CONTRAST TECHNIQUE: Contiguous axial images were obtained from the base of the skull through the vertex without intravenous contrast. COMPARISON:  July 05, 2015 FINDINGS: Paranasal sinuses, mastoid air cells, bones, and extracranial soft tissues are within normal limits. No subdural, epidural, or subarachnoid hemorrhage. Ventricles and sulci are prominent but stable. Scattered white matter changes and tiny lacunar infarcts are stable. No acute cortical ischemia or infarct. No mass, mass effect, or midline shift. Cerebellum, brainstem, and basal cisterns are normal. IMPRESSION: No acute intracranial abnormality or change. Electronically Signed   By: Gerome Samavid  Williams III M.D   On: 08/07/2015 01:58    EKG:   Orders placed or performed during the hospital encounter of 07/01/15  . EKG 12-Lead  . EKG 12-Lead    ASSESSMENT AND PLAN:   70 year old female with history of diabetes mellitus, insulin-dependent, obesity, hypertension, sleep apnea, chronic pain, anxiety and depression, bolus pemphigoid presents to the hospital secondary to hypoglycemia  #1 Hypoglycemia-due to incorrect increased Lantus dose at home. -Discontinue Lantus for now. Continue sliding scale insulin. -Sugars are improved. Watch closely as history of diabetic ketoacidosis recently.  #2 hypotension- has poor circulation, incorrect BP measurements likely due to body habitus - fluid bolus as needed to keep MAP at 60 - hold her antihypertensives - check UA to r/o infection - patient is not symptomatic  #3 hyperkalemia-likely hemolyzed sample. Repeat potassium is within normal limits.  #4 bipolar with depression and anxiety-continue Wellbutrin  #5 bullous  pemphigoid-on prednisone.  #6 tobacco use disorder-nicotine patch  #8 DVT prophylaxis-on Lovenox   Patient recently discharged from rehabilitation. At home. Wheelchair bound and has a Tax inspectorfoyer lift.   All the records are reviewed and case discussed with Care Management/Social Workerr. Management plans discussed with the patient, family and they are in agreement.  CODE STATUS: DO NOT RESUSCITATE  TOTAL TIME TAKING CARE OF THIS PATIENT: 38 minutes.   POSSIBLE D/C IN 1-2 DAYS, DEPENDING ON CLINICAL CONDITION.   Enid BaasKALISETTI,Nykeria Mealing M.D on 08/07/2015 at 3:49 PM  Between 7am to 6pm - Pager - [phone]  After 6pm go to [email or web address] - password [email or web address] [facility]  Office  [phone]  CC: Primary care physician; Emogene MorganAYCOCK, NGWE A, MD

## 2015-08-07 NOTE — Consult Note (Signed)
Pharmacy Antibiotic Note  Sylvia Black is a 70 y.o. female admitted on 08/06/2015 with UTI.  Pharmacy has been consulted for meropenem dosing.  Plan: meropenem 1g q 8 hours. pt has a PNC allery (hives). very low risk of cross sensitivty. RN called and asked to monitor pt for any reaction.  Height: 5\' 3"  (160 cm) Weight: 260 lb (117.935 kg) IBW/kg (Calculated) : 52.4  Temp (24hrs), Avg:97.9 F (36.6 C), Min:97.5 F (36.4 C), Max:98.5 F (36.9 C)   Recent Labs Lab 08/06/15 0446 08/07/15 0659 08/07/15 1521  WBC 8.4 12.8*  --   CREATININE 0.60 0.80 1.00    Estimated Creatinine Clearance: 66.8 mL/min (by C-G formula based on Cr of 1).    Allergies  Allergen Reactions  . Ciprofloxacin     Other reaction(s): Hallucinations  . Penicillins Hives    Has patient had a PCN reaction causing immediate rash, facial/tongue/throat swelling, SOB or lightheadedness with hypotension: Yes Has patient had a PCN reaction causing severe rash involving mucus membranes or skin necrosis: No Has patient had a PCN reaction that required hospitalization Yes Has patient had a PCN reaction occurring within the last 10 years: No If all of the above answers are "NO", then may proceed with Cephalosporin use.  . Sulfa Antibiotics     "hives"  . Tetanus Toxoids     "I had a strong reaction".    Antimicrobials this admission: meropenem 7/17 >>   Dose adjustments this admission:   Microbiology results:  7/17 UCx: pending    Thank you for allowing pharmacy to be a part of this patient's care.  Olene FlossMelissa D Cass Vandermeulen 08/07/2015 8:52 PM

## 2015-08-07 NOTE — Progress Notes (Signed)
Called to evaluate patient for unresponsive state.  Vitals stable, patient snoring.  Localizes to pain.  PERRLA.  CT head ordered and showed no abnormality.  ABG showed some hypoxia on room air, otherwise ok.  Patient received klonipin 2mg  and zanaflex 8mg  which were listed as home doses of these medicines.  Prior to this she had been alert and oriented.  Came in for hypoglycemia, but glucose currently has been >100.  Suspect medication effect here.  Will continue to monitor closely.  If no improvement by AM she will need further workup.  Kristeen MissWILLIS, Azyiah Bo FIELDING [facility] 08/07/2015, 3:16 AM

## 2015-08-07 NOTE — Progress Notes (Signed)
Patient's one IV occluded when attempted to hang bolus. This RN attempted to place new IV but was unsuccessful, only one potential site seen and that was the one attempted. This RN checked with charge RN Lupita LeashDonna, who is going to try and obtain IV.

## 2015-08-07 NOTE — Progress Notes (Signed)
At 2315, patient very groggy and not responsive. Pt would barely open eyes and not interact. Sugar was 126, VSS, pt took home medications less than an hour ago. MD notified, stat CT ordered, telemetry with continuous pulse ox and labs ordered. Continue to assess.

## 2015-08-07 NOTE — Plan of Care (Signed)
Problem: Education: Goal: Ability to describe self-care measures that may prevent or decrease complications (Diabetes Survival Skills Education) will improve Outcome: Progressing Patient reports not checking her blood sugar at home though she has meter and strips. RN reinforced importance of checking blood sugar on prescribed regimen. Patient's daughter also reports that patient's other daughter was helping patient administer 60 units of lantus rather than the 30 units prescribed last discharge. Patient able to recognize signs and symptoms of hypoglycemia and reports that she will drink regular pepsi to bring her sugar up.

## 2015-08-08 DIAGNOSIS — N179 Acute kidney failure, unspecified: Secondary | ICD-10-CM | POA: Diagnosis present

## 2015-08-08 DIAGNOSIS — I69354 Hemiplegia and hemiparesis following cerebral infarction affecting left non-dominant side: Secondary | ICD-10-CM | POA: Diagnosis not present

## 2015-08-08 DIAGNOSIS — Z87440 Personal history of urinary (tract) infections: Secondary | ICD-10-CM | POA: Diagnosis not present

## 2015-08-08 DIAGNOSIS — G4733 Obstructive sleep apnea (adult) (pediatric): Secondary | ICD-10-CM | POA: Diagnosis present

## 2015-08-08 DIAGNOSIS — Z79899 Other long term (current) drug therapy: Secondary | ICD-10-CM | POA: Diagnosis not present

## 2015-08-08 DIAGNOSIS — E1165 Type 2 diabetes mellitus with hyperglycemia: Secondary | ICD-10-CM | POA: Diagnosis present

## 2015-08-08 DIAGNOSIS — R197 Diarrhea, unspecified: Secondary | ICD-10-CM | POA: Diagnosis present

## 2015-08-08 DIAGNOSIS — E7800 Pure hypercholesterolemia, unspecified: Secondary | ICD-10-CM | POA: Diagnosis present

## 2015-08-08 DIAGNOSIS — E162 Hypoglycemia, unspecified: Secondary | ICD-10-CM | POA: Diagnosis present

## 2015-08-08 DIAGNOSIS — E785 Hyperlipidemia, unspecified: Secondary | ICD-10-CM | POA: Diagnosis present

## 2015-08-08 DIAGNOSIS — R112 Nausea with vomiting, unspecified: Secondary | ICD-10-CM | POA: Diagnosis present

## 2015-08-08 DIAGNOSIS — E875 Hyperkalemia: Secondary | ICD-10-CM | POA: Diagnosis present

## 2015-08-08 DIAGNOSIS — Z993 Dependence on wheelchair: Secondary | ICD-10-CM | POA: Diagnosis not present

## 2015-08-08 DIAGNOSIS — F319 Bipolar disorder, unspecified: Secondary | ICD-10-CM | POA: Diagnosis present

## 2015-08-08 DIAGNOSIS — E11649 Type 2 diabetes mellitus with hypoglycemia without coma: Secondary | ICD-10-CM | POA: Diagnosis present

## 2015-08-08 DIAGNOSIS — Z6841 Body Mass Index (BMI) 40.0 and over, adult: Secondary | ICD-10-CM | POA: Diagnosis not present

## 2015-08-08 DIAGNOSIS — N390 Urinary tract infection, site not specified: Secondary | ICD-10-CM | POA: Diagnosis present

## 2015-08-08 DIAGNOSIS — L120 Bullous pemphigoid: Secondary | ICD-10-CM | POA: Diagnosis present

## 2015-08-08 DIAGNOSIS — I10 Essential (primary) hypertension: Secondary | ICD-10-CM | POA: Diagnosis present

## 2015-08-08 DIAGNOSIS — T383X1A Poisoning by insulin and oral hypoglycemic [antidiabetic] drugs, accidental (unintentional), initial encounter: Secondary | ICD-10-CM | POA: Diagnosis present

## 2015-08-08 DIAGNOSIS — Z794 Long term (current) use of insulin: Secondary | ICD-10-CM | POA: Diagnosis not present

## 2015-08-08 DIAGNOSIS — K219 Gastro-esophageal reflux disease without esophagitis: Secondary | ICD-10-CM | POA: Diagnosis present

## 2015-08-08 DIAGNOSIS — Y92009 Unspecified place in unspecified non-institutional (private) residence as the place of occurrence of the external cause: Secondary | ICD-10-CM | POA: Diagnosis not present

## 2015-08-08 DIAGNOSIS — F419 Anxiety disorder, unspecified: Secondary | ICD-10-CM | POA: Diagnosis present

## 2015-08-08 DIAGNOSIS — A419 Sepsis, unspecified organism: Secondary | ICD-10-CM | POA: Diagnosis present

## 2015-08-08 LAB — CBC
HEMATOCRIT: 31.2 % — AB (ref 35.0–47.0)
HEMOGLOBIN: 10.3 g/dL — AB (ref 12.0–16.0)
MCH: 29.4 pg (ref 26.0–34.0)
MCHC: 33.2 g/dL (ref 32.0–36.0)
MCV: 88.6 fL (ref 80.0–100.0)
Platelets: 314 10*3/uL (ref 150–440)
RBC: 3.52 MIL/uL — ABNORMAL LOW (ref 3.80–5.20)
RDW: 15 % — AB (ref 11.5–14.5)
WBC: 9.3 10*3/uL (ref 3.6–11.0)

## 2015-08-08 LAB — BASIC METABOLIC PANEL
Anion gap: 8 (ref 5–15)
BUN: 25 mg/dL — AB (ref 6–20)
CALCIUM: 7.7 mg/dL — AB (ref 8.9–10.3)
CHLORIDE: 104 mmol/L (ref 101–111)
CO2: 24 mmol/L (ref 22–32)
CREATININE: 1.08 mg/dL — AB (ref 0.44–1.00)
GFR calc non Af Amer: 52 mL/min — ABNORMAL LOW (ref >60)
GFR, EST AFRICAN AMERICAN: 60 mL/min — AB (ref >60)
GLUCOSE: 296 mg/dL — AB (ref 65–99)
Potassium: 4.1 mmol/L (ref 3.5–5.1)
Sodium: 136 mmol/L (ref 135–145)

## 2015-08-08 LAB — GLUCOSE, CAPILLARY
GLUCOSE-CAPILLARY: 239 mg/dL — AB (ref 65–99)
GLUCOSE-CAPILLARY: 333 mg/dL — AB (ref 65–99)
Glucose-Capillary: 290 mg/dL — ABNORMAL HIGH (ref 65–99)
Glucose-Capillary: 210 mg/dL — ABNORMAL HIGH (ref 65–99)

## 2015-08-08 LAB — HEMOGLOBIN A1C: HEMOGLOBIN A1C: 9.1 % — AB (ref 4.0–6.0)

## 2015-08-08 MED ORDER — INSULIN ASPART 100 UNIT/ML ~~LOC~~ SOLN
4.0000 [IU] | Freq: Three times a day (TID) | SUBCUTANEOUS | Status: DC
  Administered 2015-08-08 – 2015-08-09 (×4): 4 [IU] via SUBCUTANEOUS
  Filled 2015-08-08 (×4): qty 4

## 2015-08-08 MED ORDER — PREDNISONE 20 MG PO TABS
20.0000 mg | ORAL_TABLET | Freq: Every day | ORAL | Status: DC
  Administered 2015-08-09: 09:00:00 20 mg via ORAL
  Filled 2015-08-08: qty 1

## 2015-08-08 MED ORDER — BUPROPION HCL ER (XL) 150 MG PO TB24
150.0000 mg | ORAL_TABLET | Freq: Two times a day (BID) | ORAL | Status: DC
  Administered 2015-08-08 – 2015-08-09 (×2): 150 mg via ORAL
  Filled 2015-08-08 (×2): qty 1

## 2015-08-08 MED ORDER — METOCLOPRAMIDE HCL 10 MG PO TABS
10.0000 mg | ORAL_TABLET | Freq: Three times a day (TID) | ORAL | Status: DC
Start: 2015-08-08 — End: 2015-08-09
  Administered 2015-08-08 – 2015-08-09 (×5): 10 mg via ORAL
  Filled 2015-08-08 (×5): qty 1

## 2015-08-08 MED ORDER — SODIUM CHLORIDE 0.9 % IV SOLN
INTRAVENOUS | Status: DC
Start: 2015-08-08 — End: 2015-08-09
  Administered 2015-08-08 – 2015-08-09 (×2): via INTRAVENOUS

## 2015-08-08 MED ORDER — INSULIN GLARGINE 100 UNIT/ML ~~LOC~~ SOLN
20.0000 [IU] | Freq: Every day | SUBCUTANEOUS | Status: DC
  Administered 2015-08-08: 23:00:00 20 [IU] via SUBCUTANEOUS
  Filled 2015-08-08 (×2): qty 0.2

## 2015-08-08 NOTE — Progress Notes (Signed)
MD made aware Sylvia Commander(Kalisetti) that patient BP upon routine check is 114/35. Patient is alert and oriented x4. Will continue to monitor. No new orders. Bo McclintockBrewer,Keyri Salberg S, RN

## 2015-08-08 NOTE — Progress Notes (Signed)
Inpatient Diabetes Program Recommendations  AACE/ADA: New Consensus Statement on Inpatient Glycemic Control (2015)  Target Ranges:  Prepandial:   less than 140 mg/dL      Peak postprandial:   less than 180 mg/dL (1-2 hours)      Critically ill patients:  140 - 180 mg/dL   Lab Results  Component Value Date   GLUCAP 290* 08/08/2015   HGBA1C 9.1* 08/06/2015    Review of Glycemic Control Results for Sylvia Black, Sylvia Black (MRN [id number]) as of 08/08/2015 08:12  Ref. Range 08/07/2015 07:32 08/07/2015 11:33 08/07/2015 17:14 08/07/2015 22:20 08/08/2015 07:46  Glucose-Capillary Latest Ref Range: 65-99 mg/dL 83 782127 (H) 956265 (H) 213346 (H) 290 (H)   Diabetes history: Type 2 diabetes Outpatient Diabetes medications: Lantus 60 units q HS -however patient was only ordered Lantus 30 units q HS per d/c summary- Novolog 4 units tid with meals. Current orders for Inpatient glycemic control:  Lantus 20 units q HS,   Inpatient Diabetes Program Recommendations:    Please consider restart of Novolog meal coverage 4 units tid with meals.  Also may consider increasing Lantus to 30 units q HS.    Thanks, Beryl MeagerJenny Theoplis Garciagarcia, RN, BC-ADM Inpatient Diabetes Coordinator Pager [phone] (8a-5p)

## 2015-08-08 NOTE — Care Management Note (Signed)
Case Management Note  Patient Details  Name: Philippa Chestereresa P Hasan MRN: [id number] Date of Birth: 02/21/1945  Subjective/Objective:        Received a request for a hospital bed for Ms Yetta BarreJones and have requested Feliberto GottronJason Hinton review the diagnosis to determine whether Ms Yetta BarreJones meets criteria for a semi-automatic hospital bed in her home.             Action/Plan:   Expected Discharge Date:                  Expected Discharge Plan:     In-House Referral:     Discharge planning Services     Post Acute Care Choice:    Choice offered to:     DME Arranged:    DME Agency:     HH Arranged:    HH Agency:     Status of Service:     If discussed at [employer] Length of Stay Meetings, dates discussed:    Additional Comments:  Carolin Quang A, RN 08/08/2015, 4:13 PM

## 2015-08-08 NOTE — Progress Notes (Addendum)
Spoke with patient regarding hypoglycemia.  She states that she "passed out" and thinks that her low blood sugars were b/c she was not eating.  Patient had been in the NH and was only on Lantus 30 units, however upon return home resumed previous home dose of Lantus 60 units.  She states that her daughter gives her insulin.  Discussed signs and symptoms of hypoglycemia and warning signs.  Also discussed importance of monitoring blood sugars at least 4 times a day and with symptoms.  Gave patient handout on hypoglycemia symptoms, treatment and blood sugar log.  Discussed 15/15 rule for treatment of low blood sugars.  Patient verbalized understanding.  She will also need Dr. To follow diabetes and other chronic issues.  She states she is in between doctors. Will need close follow up after hospitalization regarding glycemic control.  Thanks, Beryl MeagerJenny Rayce Brahmbhatt, RN, BC-ADM Inpatient Diabetes Coordinator Pager [phone] (8a-5p)

## 2015-08-08 NOTE — Progress Notes (Signed)
PT Cancellation Note  Patient Details Name: Sylvia Black MRN: [id number] DOB: 05/05/1945   Cancelled Treatment:    Reason Eval/Treat Not Completed: Medical issues which prohibited therapy.  Very low BP and will check later as time allows.   Ivar DrapeStout, Marcha Licklider E 08/08/2015, 1:30 PM    Samul Dadauth Danyon Mcginness, PT MS Acute Rehab Dept. Number: [facility] [phone] and [facility] [phone]

## 2015-08-08 NOTE — Progress Notes (Signed)
[facility] [place] [facility]   PATIENT NAME: Sylvia Black    MR#:  [id number]  DATE OF BIRTH:  06/25/1945  SUBJECTIVE:  CHIEF COMPLAINT:   Chief Complaint  Patient presents with  . Hypoglycemia  . Emesis  . Diarrhea   - Admitted with infusion and hyperglycemia and poor by mouth intake. -Sugars are improved. Off of D5 solution. But noted to be hypotensive today.  REVIEW OF SYSTEMS:  Review of Systems  Constitutional: Negative for fever, chills and malaise/fatigue.  HENT: Negative for ear discharge, ear pain and nosebleeds.   Eyes: Negative for blurred vision and double vision.  Respiratory: Negative for cough, shortness of breath and wheezing.   Cardiovascular: Positive for leg swelling. Negative for chest pain and palpitations.  Gastrointestinal: Negative for nausea, vomiting, abdominal pain, diarrhea and constipation.  Genitourinary: Negative for dysuria.  Musculoskeletal: Negative for myalgias.  Neurological: Negative for dizziness, sensory change, speech change, focal weakness, seizures and headaches.  Psychiatric/Behavioral: Negative for depression.    DRUG ALLERGIES:   Allergies  Allergen Reactions  . Ciprofloxacin     Other reaction(s): Hallucinations  . Penicillins Hives    Has patient had a PCN reaction causing immediate rash, facial/tongue/throat swelling, SOB or lightheadedness with hypotension: Yes Has patient had a PCN reaction causing severe rash involving mucus membranes or skin necrosis: No Has patient had a PCN reaction that required hospitalization Yes Has patient had a PCN reaction occurring within the last 10 years: No If all of the above answers are "NO", then may proceed with Cephalosporin use.  . Sulfa Antibiotics     "hives"  . Tetanus Toxoids     "I had a strong reaction".    VITALS:  Blood pressure 129/41, pulse 73, temperature 97.3 F (36.3 C), temperature source Oral, resp. rate 16, height 5\' 3"  (1.6 m),  weight 117.935 kg (260 lb), SpO2 100 %.  PHYSICAL EXAMINATION:  Physical Exam  GENERAL:  70 y.o.-year-old Obese patient lying in the bed with no acute distress. Not symptomatic with low blood pressure. EYES: Pupils equal, round, reactive to light and accommodation. No scleral icterus. Extraocular muscles intact.  HEENT: Head atraumatic, normocephalic. Oropharynx and nasopharynx clear.  NECK:  Supple, no jugular venous distention. No thyroid enlargement, no tenderness.  LUNGS: Normal breath sounds bilaterally, no wheezing, rales,rhonchi or crepitation. No use of accessory muscles of respiration. Increased bibasilar breath sounds. CARDIOVASCULAR: S1, S2 normal. No murmurs, rubs, or gallops.  ABDOMEN: Soft, nontender, nondistended. Bowel sounds present. No organomegaly or mass.  EXTREMITIES: No pedal edema, cyanosis, or clubbing. Cold hands to touch. Weak peripheral pulses on palpation. NEUROLOGIC: Cranial nerves II through XII are intact. Global weakness noted. Muscle strength 5/5 in RUE and 4/5 on LUE and 3/5 in left lower extremity and 4/5 on the right. Sensation intact. Gait not checked. Prior h/o CVA with left sided weakness noted. PSYCHIATRIC: The patient is alert and oriented x 3.  SKIN: No obvious rash, lesion, or ulcer. Scattered bruising noted on her arms.   LABORATORY PANEL:   CBC  Recent Labs Lab 08/08/15 0618  WBC 9.3  HGB 10.3*  HCT 31.2*  PLT 314   ------------------------------------------------------------------------------------------------------------------  Chemistries   Recent Labs Lab 08/06/15 0446  08/08/15 0618  NA 137  < > 136  K 2.8*  < > 4.1  CL 102  < > 104  CO2 28  < > 24  GLUCOSE 187*  < > 296*  BUN 14  < >  25*  CREATININE 0.60  < > 1.08*  CALCIUM 7.8*  < > 7.7*  AST 36  --   --   ALT 21  --   --   ALKPHOS 123  --   --   BILITOT 0.6  --   --   < > = values in this interval not  displayed. ------------------------------------------------------------------------------------------------------------------  Cardiac Enzymes No results for input(s): TROPONINI in the last 168 hours. ------------------------------------------------------------------------------------------------------------------  RADIOLOGY:  Ct Head Wo Contrast  08/07/2015  CLINICAL DATA:  Transient altered awareness EXAM: CT HEAD WITHOUT CONTRAST TECHNIQUE: Contiguous axial images were obtained from the base of the skull through the vertex without intravenous contrast. COMPARISON:  July 05, 2015 FINDINGS: Paranasal sinuses, mastoid air cells, bones, and extracranial soft tissues are within normal limits. No subdural, epidural, or subarachnoid hemorrhage. Ventricles and sulci are prominent but stable. Scattered white matter changes and tiny lacunar infarcts are stable. No acute cortical ischemia or infarct. No mass, mass effect, or midline shift. Cerebellum, brainstem, and basal cisterns are normal. IMPRESSION: No acute intracranial abnormality or change. Electronically Signed   By: Gerome Sam III M.D   On: 08/07/2015 01:58   Dg Chest Port 1 View  08/07/2015  CLINICAL DATA:  Status post PICC line placement.  Low blood sugar. EXAM: PORTABLE CHEST 1 VIEW COMPARISON:  Chest x-rays dated 07/02/2015, 07/01/2015 and 08/30/2008. FINDINGS: Heart size is upper normal, stable. Atherosclerotic changes at the aortic arch better seen on earlier exams. Prominence of the pulmonary hilar shadows suggests chronic pulmonary artery hypertension. Lungs are clear. No pleural effusion or pneumothorax seen. Right-sided PICC line in place with tip adequately positioned at the level of the mid SVC. IMPRESSION: 1. Right-sided PICC line in place with tip adequately positioned at the level of the mid SVC. 2. Lungs are clear.  No pleural effusion or pneumothorax seen. 3. Aortic atherosclerosis. 4. Probable chronic pulmonary artery  hypertension. Electronically Signed   By: Bary Richard M.D.   On: 08/07/2015 17:18    EKG:   Orders placed or performed during the hospital encounter of 07/01/15  . EKG 12-Lead  . EKG 12-Lead    ASSESSMENT AND PLAN:   70 year old female with history of diabetes mellitus, insulin-dependent, obesity, hypertension, sleep apnea, chronic pain, anxiety and depression, bolus pemphigoid presents to the hospital secondary to hypoglycemia  #1 Hypoglycemia-due to incorrect increased Lantus dose at home. Instead of 30 units, she was getting 60 units at home. -A1c at 9.1 -Lantus restarted and dose increased. Pre meal insulin added. Sugars are elevated today.. Continue sliding scale insulin.  #2 sepsis-blood pressure was low yesterday. Had to give fluid boluses. Urine analysis ordered showing strongly positive UA. Urine cultures sent. -Started on meropenem. -Continue gentle hydration with IV fluids today. -Hold her blood pressure medications  #3 acute renal failure-secondary to sepsis and hypotension yesterday. Fluids being restarted today. 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 Slowly decrease the dose to her maintenance dose of 5 mg. Change to 20 mg from tomorrow  #6 tobacco use disorder-nicotine patch  #8 DVT prophylaxis-on Lovenox   Patient recently discharged from rehabilitation. At home. Wheelchair bound and has a Tax inspector. If continues to improve, anticipate discharge in the next 1-2 days.   All the records are reviewed and case discussed with Care Management/Social Workerr. Management plans discussed with the patient, family and they are in agreement.  CODE STATUS: DO NOT RESUSCITATE  TOTAL  TIME TAKING CARE OF THIS PATIENT: 38 minutes.   POSSIBLE D/C IN 1-2 DAYS, DEPENDING ON CLINICAL CONDITION.   Enid Baas M.D on 08/08/2015 at 10:08 AM  Between 7am to 6pm - Pager -  [phone]  After 6pm go to [email or web address] - password [email or web address] [facility]  [place] [facility]  Office  [phone]  CC: Primary care physician; Emogene Morgan, MD

## 2015-08-09 LAB — BASIC METABOLIC PANEL
ANION GAP: 7 (ref 5–15)
BUN: 20 mg/dL (ref 6–20)
CHLORIDE: 108 mmol/L (ref 101–111)
CO2: 24 mmol/L (ref 22–32)
Calcium: 7.9 mg/dL — ABNORMAL LOW (ref 8.9–10.3)
Creatinine, Ser: 0.76 mg/dL (ref 0.44–1.00)
GFR calc non Af Amer: >60 mL/min (ref >60)
GLUCOSE: 224 mg/dL — AB (ref 65–99)
Potassium: 3.6 mmol/L (ref 3.5–5.1)
Sodium: 139 mmol/L (ref 135–145)

## 2015-08-09 LAB — URINE CULTURE

## 2015-08-09 LAB — GLUCOSE, CAPILLARY
GLUCOSE-CAPILLARY: 187 mg/dL — AB (ref 65–99)
GLUCOSE-CAPILLARY: 246 mg/dL — AB (ref 65–99)
Glucose-Capillary: 194 mg/dL — ABNORMAL HIGH (ref 65–99)

## 2015-08-09 MED ORDER — INSULIN GLARGINE 100 UNIT/ML SOLOSTAR PEN: 30.0000 [IU] | PEN_INJECTOR | Freq: Every day | SUBCUTANEOUS | Status: DC

## 2015-08-09 MED ORDER — INSULIN PEN NEEDLE 32G X 4 MM MISC: Status: AC

## 2015-08-09 MED ORDER — INSULIN ASPART 100 UNIT/ML FLEXPEN: 4.0000 [IU] | PEN_INJECTOR | Freq: Three times a day (TID) | SUBCUTANEOUS | Status: AC

## 2015-08-09 MED ORDER — TIZANIDINE HCL 4 MG PO TABS: 4.0000 mg | ORAL_TABLET | Freq: Two times a day (BID) | ORAL | Status: DC

## 2015-08-09 MED ORDER — CEPHALEXIN 250 MG PO CAPS: 250.0000 mg | ORAL_CAPSULE | Freq: Three times a day (TID) | ORAL | Status: DC

## 2015-08-09 MED ORDER — INSULIN GLARGINE 100 UNIT/ML ~~LOC~~ SOLN
30.0000 [IU] | Freq: Every day | SUBCUTANEOUS | Status: DC
  Filled 2015-08-09: qty 0.3

## 2015-08-09 MED ORDER — CEPHALEXIN 250 MG PO CAPS
250.0000 mg | ORAL_CAPSULE | Freq: Three times a day (TID) | ORAL | Status: DC
  Administered 2015-08-09: 14:00:00 250 mg via ORAL
  Filled 2015-08-09: qty 1

## 2015-08-09 NOTE — Progress Notes (Signed)
Physical Therapy Evaluation Patient Details Name: Sylvia Black MRN: [id number] DOB: 05/16/1945 Today's Date: 08/09/2015   History of Present Illness  Pt is a 70 y/o female admitted for hypoglycemia. Pt was found lethargic and unresponsive by family with a blood sugar of 40. Per MD notes low blood sugar due to pt taking increased Lantus dose at home. PMH includes HTN, DM, and history of CVA (1 year ago, with residual deficits)  Clinical Impression  Pt is a pleasant and cooperative 70 y/o female. Per CSW notes, hospital bed for home has been requested. Hospital bed will be helpful to decrease risk of pressure sores and allow pt to move into more upright posture on her own. PLOF: Pt has not stood in over a year and has not been able to sit on EOB. Pt reports she does not have much feeling in her toes, attributes that to CVA 1 year ago. Pt is essentially bed bound, lives with daughter and 68 y/o grandson who assist with cooking, diaper changes, Hoyer [facility], and mobility with manual WC. Pt was recently discharged from [facility] where they working on rolling in bed. Pt requires mod assist with bed mobility (HOB elevated) requiring B UE to pull on L bed rail and assistance from PT to position R LE over L LE to maintain sidelying position. Pt demonstrates gross 3/5 UE strength, able to reach mouth to feed herself. LE strength grossly 2/5, unable to actively lift B LE. Pt will benefit from skilled PT to improve bed mobility, strength, ROM, and home accessibility. Per patient history and PT assessment, pt appears to be at or close to her baseline and was receiving HHPT prior to hospital admission. Taking into account that patient has 24 hour assistance at home from daughter and appears to be at her baseline, pt is appropriate for HHPT at this time.    Follow Up Recommendations Home health PT;Supervision/Assistance - 24 hour (Daughter is home)    Equipment Recommendations       Recommendations for  Other Services       Precautions / Restrictions Precautions Precautions: Fall Restrictions Weight Bearing Restrictions: No      Mobility  Bed Mobility Overal bed mobility: Needs Assistance Bed Mobility: Rolling Rolling: Mod assist         General bed mobility comments: Mod assist for supine to sidelying bed mobility. Pt requires B UE support to pull herself using L bed rail. Pt also requires PT assistance to place R LE over L to maintain sidelying position. Pt reports it is easier for her to roll to L.   Transfers Overall transfer level:  (Not attempted)               General transfer comment: Transfers not attempted due to the fact pt uses Hoyer lift to transfer at baseline  Ambulation/Gait Ambulation/Gait assistance:  (Not attempted)           General Gait Details: Does not ambulate at baseline  Stairs            Wheelchair Mobility    Modified Rankin (Stroke Patients Only)       Balance                                             Pertinent Vitals/Pain Pain Assessment: No/denies pain    Home Living Family/patient expects  to be discharged to:: Private residence Living Arrangements: Children (daughter and 31 y/o grandson) Available Help at Discharge: Family;Available 24 hours/day (daughter works from home) Type of Home: House Home Access: Ramped entrance (3 Ramps)       Home Equipment: Wheelchair - manual      Prior Function Level of Independence: Needs assistance   Gait / Transfers Assistance Needed: daughter uses Hoyer lift to transfer pt to [facility]  ADL's / [employer] Assistance Needed: Daughter assists with all ADLs  Comments: Pt was bed bound prior to hospital admission. Pt is able to feed herself but relies on her daughter for assistance with cooking, diaper changes, WC mobility, wtc.     Hand Dominance        Extremity/Trunk Assessment   Upper Extremity Assessment: Generalized weakness (elbow flex/ext: 3/5,  grip strength 5/5)           Lower Extremity Assessment: Generalized weakness (LE grossly 2/5.)         Communication   Communication: No difficulties  Cognition Arousal/Alertness: Awake/alert Behavior During Therapy: WFL for tasks assessed/performed Overall Cognitive Status: Within Functional Limits for tasks assessed                      General Comments General comments (skin integrity, edema, etc.): Pt's LE examined, no indications of poor skin integrity or redness    Exercises        Assessment/Plan    PT Assessment Patient needs continued PT services  PT Diagnosis Generalized weakness   PT Problem List Decreased strength;Decreased range of motion;Decreased activity tolerance;Decreased balance;Decreased mobility;Decreased coordination;Impaired sensation  PT Treatment Interventions Functional mobility training;Therapeutic activities;Therapeutic exercise;Patient/family education   PT Goals (Current goals can be found in the Care Plan section) Acute Rehab PT Goals Patient Stated Goal: To return home PT Goal Formulation: With patient Time For Goal Achievement: 08/23/15 Potential to Achieve Goals: Good    Frequency Min 2X/week   Barriers to discharge        Co-evaluation               End of Session   Activity Tolerance: Patient limited by fatigue Patient left: in bed;with call bell/phone within reach;with bed alarm set Nurse Communication: Mobility status         Time: 1610-9604 PT Time Calculation (min) (ACUTE ONLY): 19 min   Charges:         PT G Codes:        Thereasa Parkin 08/29/15, 11:47 AM  Thereasa Parkin, SPT [phone]

## 2015-08-09 NOTE — Progress Notes (Signed)
Discharge instructions given and went over with patient at bedside. Awaiting EMS. Patient to be discharged home with family and home health services. Bo McclintockBrewer,Desera Graffeo S, RN

## 2015-08-09 NOTE — Progress Notes (Signed)
EMS called for transport. Amyrah Pinkhasov S, RN  

## 2015-08-09 NOTE — Care Management (Signed)
Discharge to home today per Dr. Geraldo DockerKalisett. Will be followed by [facility] for Home nursing, physical therapy and aide services. Hospital bed will be provided by [facility]. Spoke with Ms. Yetta BarreJones at the bedside. States her daughter will be at the home when the bed is delvered. Also, she has a follow-up bed that she can get in, if needed, before the bed is delivered. Requested to be transported to home per [facility] unit. Gwenette GreetBrenda S Cassanda Walmer RN MSN CCM Care Management [phone]

## 2015-08-09 NOTE — Discharge Summary (Signed)
[facility] - [place] at [facility]   PATIENT NAME: Sylvia Black    MR#:  [id number]  DATE OF BIRTH:  23-Feb-1945  DATE OF ADMISSION:  08/06/2015 ADMITTING PHYSICIAN: Houston Siren, MD  DATE OF DISCHARGE: 08/09/2015  PRIMARY CARE PHYSICIAN: Emogene Morgan, MD    ADMISSION DIAGNOSIS:  Hypoglycemia [E16.2]  DISCHARGE DIAGNOSIS:  Active Problems:   Hypoglycemia   SECONDARY DIAGNOSIS:   Past Medical History  Diagnosis Date  . Hypertension   . Frequent UTI   . PONV (postoperative nausea and vomiting)     "makes me violently ill" (02/22/2013)  . Family history of anesthesia complication     "daughter's w/PONV" (02/22/2013)  . High cholesterol   . Type II diabetes mellitus (HCC)   . Pneumonia 1990's    "once"  . Sleep apnea     "dx'd in the 1990's; don't wear mask" (02/22/2013)  . GERD (gastroesophageal reflux disease)   . Stroke [facility]) 2010; 02/2012    denies residual; "balance issues since & weak on left side since" (02/22/2013)  . 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 Gout attack     "twice" (02/22/2013)  . Anxiety   . Depression   . Bipolar disorder (HCC)   . Vertigo   . Bullous pemphigoid     HOSPITAL COURSE:   70 year old female with history of diabetes mellitus, insulin-dependent, obesity, hypertension, sleep apnea, chronic pain, anxiety and depression, bolus pemphigoid presents to the hospital secondary to hypoglycemia  #1 Hypoglycemia-due to incorrect increased Lantus dose at home. Instead of 30 units, she was getting 60 units at home. -A1c at 9.1 -Lantus restarted and at 30 units now, tolerating well- discharging on flex pens, also novolog tid with meals.  #2 sepsis-blood pressure was low - improving now Urine analysis ordered showing strongly positive UA. Urine cultures pending. -was on meropenem. Being discharged on keflex.  #3 acute renal failure-secondary to sepsis and hypotension. Improving  #4  bipolar with depression and anxiety-continue Wellbutrin  #5 bullous pemphigoid-on prednisone maintenance dose.   #6 HTN-better now. Discontinue her metoprolol, lisinopril and Norvasc at discharge  #7 history of CVA and residual left-sided weakness. Continue Plavix and statin. Patient has CVA, sleep apnea which requires her to reposition frequently, bed to be positioned to keep her head elevated to greater than 45 to prevent risk of aspiration and dyspnea. This cannot be achieved within normal bed. So she will need to hospital bed.   Patient will be discharged home with home health services today.  DISCHARGE CONDITIONS:   Guarded  CONSULTS OBTAINED:   none  DRUG ALLERGIES:   Allergies  Allergen Reactions  . Ciprofloxacin     Other reaction(s): Hallucinations  . Penicillins Hives    Has patient had a PCN reaction causing immediate rash, facial/tongue/throat swelling, SOB or lightheadedness with hypotension: Yes Has patient had a PCN reaction causing severe rash involving mucus membranes or skin necrosis: No Has patient had a PCN reaction that required hospitalization Yes Has patient had a PCN reaction occurring within the last 10 years: No If all of the above answers are "NO", then may proceed with Cephalosporin use.  . Sulfa Antibiotics     "hives"  . Tetanus Toxoids     "I had a strong reaction".    DISCHARGE MEDICATIONS:   Current Discharge Medication List    START taking these medications   Details  cephALEXin (KEFLEX) 250  MG capsule Take 1 capsule (250 mg total) by mouth every 8 (eight) hours. X 5 more days Qty: 15 capsule, Refills: 0    insulin aspart (NOVOLOG) 100 UNIT/ML FlexPen Inject 4 Units into the skin 3 (three) times daily with meals. Qty: 15 mL, Refills: 11    Insulin Glargine (LANTUS) 100 UNIT/ML Solostar Pen Inject 30 Units into the skin at bedtime. Qty: 15 mL, Refills: 11    Insulin Pen Needle (CARETOUCH PEN NEEDLES) 32G X 4 MM MISC Use with flex  pens for insulin Qty: 100 each, Refills: 11      CONTINUE these medications which have CHANGED   Details  tiZANidine (ZANAFLEX) 4 MG tablet Take 1 tablet (4 mg total) by mouth 2 (two) times daily. Qty: 30 tablet, Refills: 0      CONTINUE these medications which have NOT CHANGED   Details  buPROPion (WELLBUTRIN XL) 150 MG 24 hr tablet Take 150 mg by mouth 2 (two) times daily.     clobetasol cream (TEMOVATE) 0.05 % Apply 1 application topically daily as needed.    clopidogrel (PLAVIX) 75 MG tablet Take 1 tablet (75 mg total) by mouth daily. Qty: 30 tablet, Refills: 0    diclofenac sodium (VOLTAREN) 1 % GEL Apply topically 4 (four) times daily.    lidocaine (LIDODERM) 5 % Place 1 patch onto the skin daily. Remove & Discard patch within 12 hours or as directed by MD    ammonium lactate (LAC-HYDRIN) 12 % lotion Apply twice a day to feet bilaterally Qty: 400 g, Refills: 0    atorvastatin (LIPITOR) 40 MG tablet Take 1 tablet (40 mg total) by mouth daily at 6 PM. Qty: 30 tablet, Refills: 0    metoCLOPramide (REGLAN) 10 MG tablet Take 1 tablet (10 mg total) by mouth 4 (four) times daily -  before meals and at bedtime. Qty: 120 tablet, Refills: 0    predniSONE (DELTASONE) 5 MG tablet Take 1 tablet (5 mg total) by mouth daily with breakfast. Qty: 30 tablet, Refills: 0      STOP taking these medications     amLODipine (NORVASC) 10 MG tablet      clonazePAM (KLONOPIN) 1 MG tablet      insulin glargine (LANTUS) 100 UNIT/ML injection      lisinopril (PRINIVIL,ZESTRIL) 40 MG tablet      metoprolol succinate (TOPROL-XL) 100 MG 24 hr tablet      amLODipine (NORVASC) 5 MG tablet      insulin aspart (NOVOLOG) 100 UNIT/ML injection      insulin glargine (LANTUS) 100 UNIT/ML injection          DISCHARGE INSTRUCTIONS:   1. PCP follow-up in 1-2 weeks   If you experience worsening of your admission symptoms, develop shortness of breath, life threatening emergency, suicidal or  homicidal thoughts you must seek medical attention immediately by calling 911 or calling your MD immediately  if symptoms less severe.  You Must read complete instructions/literature along with all the possible adverse reactions/side effects for all the Medicines you take and that have been prescribed to you. Take any new Medicines after you have completely understood and accept all the possible adverse reactions/side effects.   Please note  You were cared for by a hospitalist during your hospital stay. If you have any questions about your discharge medications or the care you received while you were in the hospital after you are discharged, you can call the unit and asked to speak with the hospitalist on  call if the hospitalist that took care of you is not available. Once you are discharged, your primary care physician will handle any further medical issues. Please note that NO REFILLS for any discharge medications will be authorized once you are discharged, as it is imperative that you return to your primary care physician (or establish a relationship with a primary care physician if you do not have one) for your aftercare needs so that they can reassess your need for medications and monitor your lab values.    Today   CHIEF COMPLAINT:   Chief Complaint  Patient presents with  . Hypoglycemia  . Emesis  . Diarrhea    VITAL SIGNS:  Blood pressure 124/61, pulse 105, temperature 98.1 F (36.7 C), temperature source Oral, resp. rate 18, height  (1.6 m), weight 117.935 kg (260 lb), SpO2 98 %.  I/O:   Intake/Output Summary (Last 24 hours) at 08/09/15 1508 Last data filed at 08/09/15 1411  Gross per 24 hour  Intake   2448 ml  Output      0 ml  Net   2448 ml    PHYSICAL EXAMINATION:   Physical Exam  GENERAL: 70 y.o.-year-old Obese patient lying in the bed with no acute distress. Not symptomatic with low blood pressure. EYES: Pupils equal, round, reactive to light and  accommodation. No scleral icterus. Extraocular muscles intact.  HEENT: Head atraumatic, normocephalic. Oropharynx and nasopharynx clear.  NECK: Supple, no jugular venous distention. No thyroid enlargement, no tenderness.  LUNGS: Normal breath sounds bilaterally, no wheezing, rales,rhonchi or crepitation. No use of accessory muscles of respiration. Increased bibasilar breath sounds. CARDIOVASCULAR: S1, S2 normal. No murmurs, rubs, or gallops.  ABDOMEN: Soft, nontender, nondistended. Bowel sounds present. No organomegaly or mass.  EXTREMITIES: No pedal edema, cyanosis, or clubbing. Cold hands to touch. Weak peripheral pulses on palpation. NEUROLOGIC: Cranial nerves II through XII are intact. Global weakness noted. Muscle strength 5/5 in RUE and 4/5 on LUE and 3/5 in left lower extremity and 4/5 on the right. Sensation intact. Gait not checked. Prior h/o CVA with left sided weakness noted. PSYCHIATRIC: The patient is alert and oriented x 3.  SKIN: No obvious rash, lesion, or ulcer. Scattered bruising noted on her arms.  DATA REVIEW:   CBC  Recent Labs Lab 08/08/15 0618  WBC 9.3  HGB 10.3*  HCT 31.2*  PLT 314    Chemistries   Recent Labs Lab 08/06/15 0446  08/09/15 0500  NA 137  < > 139  K 2.8*  < > 3.6  CL 102  < > 108  CO2 28  < > 24  GLUCOSE 187*  < > 224*  BUN 14  < > 20  CREATININE 0.60  < > 0.76  CALCIUM 7.8*  < > 7.9*  AST 36  --   --   ALT 21  --   --   ALKPHOS 123  --   --   BILITOT 0.6  --   --   < > = values in this interval not displayed.  Cardiac Enzymes No results for input(s): TROPONINI in the last 168 hours.  Microbiology Results  Results for orders placed or performed during the hospital encounter of 08/06/15  Urine culture     Status: Abnormal   Collection Time: 08/07/15  6:17 PM  Result Value Ref Range Status   Specimen Description URINE, RANDOM  Final   Special Requests NONE  Final   Culture MULTIPLE SPECIES PRESENT, SUGGEST RECOLLECTION  (  A)  Final   Report Status 08/09/2015 FINAL  Final    RADIOLOGY:  Dg Chest Port 1 View  08/07/2015  CLINICAL DATA:  Status post PICC line placement.  Low blood sugar. EXAM: PORTABLE CHEST 1 VIEW COMPARISON:  Chest x-rays dated 07/02/2015, 07/01/2015 and 08/30/2008. FINDINGS: Heart size is upper normal, stable. Atherosclerotic changes at the aortic arch better seen on earlier exams. Prominence of the pulmonary hilar shadows suggests chronic pulmonary artery hypertension. Lungs are clear. No pleural effusion or pneumothorax seen. Right-sided PICC line in place with tip adequately positioned at the level of the mid SVC. IMPRESSION: 1. Right-sided PICC line in place with tip adequately positioned at the level of the mid SVC. 2. Lungs are clear.  No pleural effusion or pneumothorax seen. 3. Aortic atherosclerosis. 4. 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 Electronically Signed   By: Bary RichardStan  Maynard M.D.   On: 08/07/2015 17:18    EKG:   Orders placed or performed during the hospital encounter of 07/01/15  . EKG 12-Lead  . EKG 12-Lead      Management plans discussed with the patient, family and they are in agreement.  CODE STATUS:     Code Status Orders        Start     Ordered   08/06/15 1818  Do not attempt resuscitation (DNR)   Continuous    Question Answer Comment  In the event of cardiac or respiratory ARREST Do not call a "code blue"   In the event of cardiac or respiratory ARREST Do not perform Intubation, CPR, defibrillation or ACLS   In the event of cardiac or respiratory ARREST Use medication by any route, position, wound care, and other measures to relive pain and suffering. May use oxygen, suction and manual treatment of airway obstruction as needed for comfort.      08/06/15 1817    Code Status History    Date Active Date Inactive Code Status Order ID Comments User Context   07/01/2015 10:51 AM 07/07/2015  9:40 PM DNR [id number]  Erin FullingKurian Kasa, MD ED   07/01/2015 10:31  AM 07/01/2015 10:51 AM Full Code [id number]  Erin FullingKurian Kasa, MD ED   02/22/2013  4:38 AM 02/23/2013  4:50 PM Full Code [id number]  Dorothea OgleIskra M Myers, MD Inpatient    Advance Directive Documentation        Most Recent Value   Type of Advance Directive  Healthcare Power of Attorney, Living will   Pre-existing out of facility DNR order (yellow form or pink MOST form)     "MOST" Form in Place?        TOTAL TIME TAKING CARE OF THIS PATIENT: 38 minutes.    Enid BaasKALISETTI,Judaea Burgoon M.D on 08/09/2015 at 3:08 PM  Between 7am to 6pm - Pager - [phone]  After 6pm go to [email or web address] - password [email or web address] [facility]  [facility]  Office  [phone]  CC: Primary care physician; Emogene MorganAYCOCK, NGWE A, MD

## 2015-08-09 NOTE — Progress Notes (Signed)
Patient discharged home via EMS. Daughter made aware. Sylvia Black,Sylvia Dauria S, RN

## 2015-08-09 NOTE — Care Management (Signed)
Other Health Conditions Patient requires repositioning which can not be achieved with a regular bed.  Patient will require a hospital bed  Patient has osteoarthritis, sleep apnea which requires the upper body/head to be positioned in ways not feasible with a normal bed. Head must be elevated at least 45 degrees or Respiratory distress will occur.  Gwenette GreetBrenda S Jamilette Suchocki RN MSN CCM Care Management [phone]

## 2015-08-14 LAB — BLOOD GAS, ARTERIAL
ALLENS TEST (PASS/FAIL): POSITIVE — AB
Acid-Base Excess: 4.7 mmol/L — ABNORMAL HIGH (ref 0.0–3.0)
BICARBONATE: 29.2 meq/L — AB (ref 21.0–28.0)
FIO2: 0.21
O2 SAT: 84.7 %
PATIENT TEMPERATURE: 37
PO2 ART: 47 mmHg — AB (ref 83.0–108.0)
pCO2 arterial: 42 mmHg (ref 32.0–48.0)
pH, Arterial: 7.45 (ref 7.350–7.450)

## 2015-09-21 ENCOUNTER — Other Ambulatory Visit: Payer: Self-pay | Admitting: Gastroenterology

## 2015-09-21 DIAGNOSIS — R1112 Projectile vomiting: Secondary | ICD-10-CM

## 2015-10-05 ENCOUNTER — Ambulatory Visit
Admission: RE | Admit: 2015-10-05 | Discharge: 2015-10-05 | Disposition: A | Discharge status: Home / Self Care | Payer: Medicare HMO | Source: Ambulatory Visit | Attending: Gastroenterology | Admitting: Gastroenterology

## 2015-10-05 DIAGNOSIS — I700 Atherosclerosis of aorta: Secondary | ICD-10-CM | POA: Diagnosis not present

## 2015-10-05 DIAGNOSIS — Z9049 Acquired absence of other specified parts of digestive tract: Secondary | ICD-10-CM | POA: Insufficient documentation

## 2015-10-05 DIAGNOSIS — R1112 Projectile vomiting: Secondary | ICD-10-CM | POA: Insufficient documentation

## 2015-10-05 MED ORDER — IOPAMIDOL (ISOVUE-300) INJECTION 61%
100.0000 mL | Freq: Once | INTRAVENOUS | Status: AC | PRN
  Administered 2015-10-05: 100 mL via INTRAVENOUS

## 2015-10-11 ENCOUNTER — Other Ambulatory Visit: Payer: Self-pay | Admitting: Gastroenterology

## 2015-10-11 DIAGNOSIS — R935 Abnormal findings on diagnostic imaging of other abdominal regions, including retroperitoneum: Secondary | ICD-10-CM

## 2015-10-12 ENCOUNTER — Telehealth: Payer: Self-pay

## 2015-10-12 NOTE — Telephone Encounter (Signed)
  Oncology Nurse Navigator Documentation Received [facility]ice [facility]. [facility]. Noted to be on plavix.History lists diabetes. Voicemail left with Ms. Yetta BarreJones to return call for scheduling. Navigator Location: [facility] (10/12/15 1500) Navigator Encounter Type: Telephone (10/12/15 1500) Telephone: Outgoing Call (10/12/15 1500)             Barriers/Navigation Needs: Coordination of Care (10/12/15 1500)   Interventions: Coordination of Care (10/12/15 1500)   Coordination of Care: EUS (10/12/15 1500)        Acuity: Level 2 (10/12/15 1500)   Acuity Level 2: Initial guidance, education and coordination as needed;Educational needs;Ongoing guidance and education throughout treatment as needed (10/12/15 1500)     Time Spent with Patient: 30 (10/12/15 1500)

## 2015-10-13 ENCOUNTER — Telehealth: Payer: Self-pay

## 2015-10-13 NOTE — Telephone Encounter (Signed)
  Oncology Nurse Navigator Documentation Spoke with Sylvia Black (daughter and POA) on the phone. Arranged EUS for 10/19 [facility]. Went over instructions and copy mailed to home address. Clearance for Plavix hold faxed to Dr Let[facility]Aycock at [facility]. Notified Crystal that I would contact her regarding holding Plavix once clearance received.  INSTRUCTIONS FOR ENDOSCOPIC ULTRASOUND -Your procedure has been scheduled for October 19th with [facility]Jowell [facility] -The hospital will contact you to pre-register over the phone.  -To get your scheduled arrival time, please call the Endoscopy unit at  [phone] between 1-3pm on:  October 18th   -ON THE DAY OF YOU PROCEDURE:   1. If you are scheduled for a morning procedure, nothing to drink after midnight  -If you are scheduled for an afternoon procedure, you may have clear liquids until 5 hours prior  to the procedure but no carbonated drinks or broth  2. NO FOOD THE DAY OF YOUR PROCEDURE  3. You may take your heart, seizure, blood pressure, Parkinson's or breathing medications at  6am with just enough water to get your pills down  4. Do not take any oral Diabetic medications the morning of your procedure.  5. If you are a diabetic and are using insulin, please notify your prescribing physician of this  procedure as your dose may need to be altered related to not being able to eat or drink.   5. Do not take Vitamins for 5 days before your procedure     -On the day of your procedure, come to the [facility] Admitting/Registration desk (First desk on the right) at the scheduled arrival time. You MUST have someone drive you home from your procedure. You must have a responsible adult with a valid driver's license who is on site throughout your entire procedure and who can stay with you for several hours after your procedure. You may not go home alone in a taxi, shuttle [place] or bus, as the drivers will not be responsible for you.  --If you  have any questions please call me at the above contact  Navigator Location: [facility] (10/13/15 1100) Navigator Encounter Type: Telephone (10/13/15 1100)               Barriers/Navigation Needs: Coordination of Care (10/13/15 1100)   Interventions: Coordination of Care (10/13/15 1100)   Coordination of Care: EUS (10/13/15 1100)                  Time Spent with Patient: 60 (10/13/15 1100)

## 2015-10-17 ENCOUNTER — Ambulatory Visit
Admission: RE | Admit: 2015-10-17 | Discharge: 2015-10-17 | Disposition: A | Discharge status: Home / Self Care | Payer: Medicare HMO | Source: Ambulatory Visit | Attending: Gastroenterology | Admitting: Gastroenterology

## 2015-10-17 DIAGNOSIS — R935 Abnormal findings on diagnostic imaging of other abdominal regions, including retroperitoneum: Secondary | ICD-10-CM | POA: Insufficient documentation

## 2015-10-17 DIAGNOSIS — N858 Other specified noninflammatory disorders of uterus: Secondary | ICD-10-CM | POA: Insufficient documentation

## 2015-10-23 ENCOUNTER — Telehealth: Payer: Self-pay

## 2015-10-23 NOTE — Telephone Encounter (Signed)
  Oncology Nurse Navigator Documentation Received call over the weekend from [place] [facility]. Returned call. She reports that Dr Letta PateAycock needed to see patient before she could hold plavix for EUS. When they contacted daughter regarding she told them she didn't need to bring her in, it was just to hold plavix. They ask for me to refax clearance to them. [facility] Navigator Location: [facility] (10/23/15 0900) Navigator Encounter Type: Telephone;Other;Letter/Fax/Email (10/23/15 0900)                                          Time Spent with Patient: 15 (10/23/15 0900)

## 2015-10-27 ENCOUNTER — Telehealth: Payer: Self-pay

## 2015-10-27 NOTE — Telephone Encounter (Signed)
  Oncology Nurse Navigator Documentation Received clearanace to hold Plavix for 5 days before EUS. Voicemail left with daughter Crystal regarding. Last dose of Plavix will be 10/13. Dr Nanda QuintonJowell will instruct on when to resume after EUS. Ask her to call to confirm that she received these instructions. Clearance filed to medical records.  Navigator Location: [facility] (10/27/15 1000) Navigator Encounter Type: Telephone (10/27/15 1000)                                          Time Spent with Patient: 15 (10/27/15 1000)

## 2015-10-30 NOTE — Progress Notes (Signed)
  Oncology Nurse Navigator Documentation Received confirmation from Crystal (daughter) regarding holding Plavix 5 days prior to EUS Navigator Location: [facility] (10/30/15 1000) Navigator Encounter Type: Telephone (10/30/15 1000) Telephone: Education (10/30/15 1000)                                        Time Spent with Patient: 15 (10/30/15 1000)

## 2015-11-09 ENCOUNTER — Ambulatory Visit
Admission: RE | Admit: 2015-11-09 | Discharge: 2015-11-09 | Disposition: A | Discharge status: Home / Self Care | Payer: Medicare HMO | Source: Ambulatory Visit | Attending: Gastroenterology | Admitting: Gastroenterology

## 2015-11-09 ENCOUNTER — Encounter: Payer: Self-pay | Admitting: *Deleted

## 2015-11-09 ENCOUNTER — Ambulatory Visit: Payer: Medicare HMO | Admitting: Anesthesiology

## 2015-11-09 ENCOUNTER — Encounter
Admission: RE | Disposition: A | Discharge status: Home / Self Care | Payer: Self-pay | Source: Ambulatory Visit | Attending: Gastroenterology

## 2015-11-09 DIAGNOSIS — Z8673 Personal history of transient ischemic attack (TIA), and cerebral infarction without residual deficits: Secondary | ICD-10-CM | POA: Insufficient documentation

## 2015-11-09 DIAGNOSIS — K862 Cyst of pancreas: Secondary | ICD-10-CM | POA: Diagnosis present

## 2015-11-09 DIAGNOSIS — E119 Type 2 diabetes mellitus without complications: Secondary | ICD-10-CM | POA: Insufficient documentation

## 2015-11-09 DIAGNOSIS — F329 Major depressive disorder, single episode, unspecified: Secondary | ICD-10-CM | POA: Diagnosis not present

## 2015-11-09 DIAGNOSIS — I10 Essential (primary) hypertension: Secondary | ICD-10-CM | POA: Insufficient documentation

## 2015-11-09 DIAGNOSIS — Z794 Long term (current) use of insulin: Secondary | ICD-10-CM | POA: Insufficient documentation

## 2015-11-09 DIAGNOSIS — E7800 Pure hypercholesterolemia, unspecified: Secondary | ICD-10-CM | POA: Diagnosis not present

## 2015-11-09 DIAGNOSIS — G4730 Sleep apnea, unspecified: Secondary | ICD-10-CM | POA: Diagnosis not present

## 2015-11-09 DIAGNOSIS — Z79899 Other long term (current) drug therapy: Secondary | ICD-10-CM | POA: Insufficient documentation

## 2015-11-09 DIAGNOSIS — Z7902 Long term (current) use of antithrombotics/antiplatelets: Secondary | ICD-10-CM | POA: Insufficient documentation

## 2015-11-09 HISTORY — PX: UPPER ESOPHAGEAL ENDOSCOPIC ULTRASOUND (EUS): SHX6562

## 2015-11-09 LAB — GLUCOSE, CAPILLARY: GLUCOSE-CAPILLARY: 183 mg/dL — AB (ref 65–99)

## 2015-11-09 SURGERY — UPPER ESOPHAGEAL ENDOSCOPIC ULTRASOUND (EUS)
Anesthesia: General

## 2015-11-09 MED ORDER — SODIUM CHLORIDE 0.9 % IV SOLN
INTRAVENOUS | Status: DC
  Administered 2015-11-09: 1000 mL via INTRAVENOUS

## 2015-11-09 MED ORDER — FENTANYL CITRATE (PF) 100 MCG/2ML IJ SOLN
INTRAMUSCULAR | Status: DC | PRN
  Administered 2015-11-09: 50 ug via INTRAVENOUS

## 2015-11-09 MED ORDER — FENTANYL CITRATE (PF) 100 MCG/2ML IJ SOLN: 25.0000 ug | INTRAMUSCULAR | Status: DC | PRN

## 2015-11-09 MED ORDER — DEXAMETHASONE SODIUM PHOSPHATE 10 MG/ML IJ SOLN
INTRAMUSCULAR | Status: DC | PRN
  Administered 2015-11-09: 5 mg via INTRAVENOUS

## 2015-11-09 MED ORDER — SUCCINYLCHOLINE CHLORIDE 20 MG/ML IJ SOLN
INTRAMUSCULAR | Status: DC | PRN
  Administered 2015-11-09: 100 mg via INTRAVENOUS

## 2015-11-09 MED ORDER — MIDAZOLAM HCL 2 MG/2ML IJ SOLN
INTRAMUSCULAR | Status: DC | PRN
  Administered 2015-11-09: 1 mg via INTRAVENOUS

## 2015-11-09 MED ORDER — EPHEDRINE SULFATE 50 MG/ML IJ SOLN
INTRAMUSCULAR | Status: DC | PRN
  Administered 2015-11-09: 10 mg via INTRAVENOUS

## 2015-11-09 MED ORDER — PROPOFOL 10 MG/ML IV BOLUS
INTRAVENOUS | Status: DC | PRN
  Administered 2015-11-09: 150 mg via INTRAVENOUS
  Administered 2015-11-09: 50 mg via INTRAVENOUS

## 2015-11-09 MED ORDER — ONDANSETRON HCL 4 MG/2ML IJ SOLN: 4.0000 mg | Freq: Once | INTRAMUSCULAR | Status: DC | PRN

## 2015-11-09 MED ORDER — LIDOCAINE HCL (CARDIAC) 20 MG/ML IV SOLN
INTRAVENOUS | Status: DC | PRN
  Administered 2015-11-09: 50 mg via INTRAVENOUS

## 2015-11-09 MED ORDER — LIDOCAINE HCL (PF) 1 % IJ SOLN
INTRAMUSCULAR | Status: AC
  Administered 2015-11-09: 0.03 mL via INTRADERMAL
  Filled 2015-11-09: qty 2

## 2015-11-09 MED ORDER — GLYCOPYRROLATE 0.2 MG/ML IJ SOLN
INTRAMUSCULAR | Status: DC | PRN
  Administered 2015-11-09: 0.2 mg via INTRAVENOUS

## 2015-11-09 MED ORDER — PHENYLEPHRINE HCL 10 MG/ML IJ SOLN
INTRAMUSCULAR | Status: DC | PRN
  Administered 2015-11-09 (×3): 100 ug via INTRAVENOUS

## 2015-11-09 MED ORDER — LIDOCAINE HCL (PF) 1 % IJ SOLN
2.0000 mL | Freq: Once | INTRAMUSCULAR | Status: AC
  Administered 2015-11-09: 0.03 mL via INTRADERMAL

## 2015-11-09 MED ORDER — ONDANSETRON HCL 4 MG/2ML IJ SOLN
INTRAMUSCULAR | Status: DC | PRN
  Administered 2015-11-09: 4 mg via INTRAVENOUS

## 2015-11-09 NOTE — Transfer of Care (Signed)
Immediate Anesthesia Transfer of Care Note  Patient: Sylvia Black  Procedure(s) Performed: Procedure(s): UPPER ESOPHAGEAL ENDOSCOPIC ULTRASOUND (EUS) (N/A)  Patient Location: PACU  Anesthesia Type:General  Level of Consciousness: awake and alert   Airway & Oxygen Therapy: Patient Spontanous Breathing and Patient connected to face mask oxygen  Post-op Assessment: Report given to RN and Post -op Vital signs reviewed and stable  Post vital signs: Reviewed  Last Vitals:  Vitals:   11/09/15 1508 11/09/15 1509  BP: (!) 154/49 (!) (P) 154/49  Pulse: 98   Resp: 20   Temp: (!) 36.1 C (!) (P) 35.6 C    Last Pain:  Vitals:   11/09/15 1327  TempSrc: Tympanic         Complications: No apparent anesthesia complications

## 2015-11-09 NOTE — Anesthesia Preprocedure Evaluation (Signed)
Anesthesia Evaluation  Patient identified by MRN, date of birth, ID band Patient awake    Reviewed: Allergy & Precautions, NPO status , Patient's Chart, lab work & pertinent test results  History of Anesthesia Complications (+) PONV and history of anesthetic complications  Airway Mallampati: III  TM Distance: <3 FB     Dental  (+) Lower Dentures, Upper Dentures   Pulmonary sleep apnea , pneumonia, resolved,    Pulmonary exam normal        Cardiovascular hypertension, Pt. on medications Normal cardiovascular exam     Neuro/Psych PSYCHIATRIC DISORDERS Anxiety Depression Bipolar Disorder CVA    GI/Hepatic Neg liver ROS, GERD  Medicated and Controlled,  Endo/Other  diabetes, Well Controlled, Type 2, Oral Hypoglycemic Agents  Renal/GU negative Renal ROS  negative genitourinary   Musculoskeletal negative musculoskeletal ROS (+)   Abdominal (+) + obese,   Peds negative pediatric ROS (+)  Hematology negative hematology ROS (+)   Anesthesia Other Findings Past Medical History: No date: Anxiety No date: Bipolar disorder (HCC) No date: Bullous pemphigoid No date: 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     Comment: "left; see pain dr for that" (02/22/2013) No date: Depression No date: Family history of anesthesia complication     Comment: "daughter's w/PONV" (02/22/2013) No date: Frequent UTI No date: GERD (gastroesophageal reflux disease) No date: Gout attack     Comment: "twice" (02/22/2013) No date: High cholesterol No date: Hypertension 1990's: Pneumonia     Comment: "once" No date: PONV (postoperative nausea and vomiting)     Comment: "makes me violently ill" (02/22/2013) No date: Sleep apnea     Comment: "dx'd in the 1990's; don't wear mask"               (02/22/2013) 2010; 02/2012: Stroke (HCC)     Comment: denies residual; "balance issues since & weak               on left side since" (02/22/2013) No date: Type II diabetes  mellitus (HCC) No date: Vertigo  Reproductive/Obstetrics                             Anesthesia Physical Anesthesia Plan  ASA: III  Anesthesia Plan: General   Post-op Pain Management:    Induction: Intravenous, Rapid sequence and Cricoid pressure planned  Airway Management Planned: Oral ETT  Additional Equipment:   Intra-op Plan:   Post-operative Plan: Extubation in OR  Informed Consent: I have reviewed the patients History and Physical, chart, labs and discussed the procedure including the risks, benefits and alternatives for the proposed anesthesia with the patient or authorized representative who has indicated his/her understanding and acceptance.   Dental advisory given  Plan Discussed with: CRNA and Surgeon  Anesthesia Plan Comments:         Anesthesia Quick Evaluation

## 2015-11-09 NOTE — Anesthesia Preprocedure Evaluation (Addendum)
Anesthesia Evaluation  Patient identified by MRN, date of birth, ID band Patient awake    Reviewed: Allergy & Precautions, H&P , NPO status , Patient's Chart, lab work & pertinent test results  History of Anesthesia Complications (+) PONV, Family history of anesthesia reaction and history of anesthetic complications  Airway Mallampati: III  TM Distance: <3 FB Neck ROM: limited    Dental  (+) Poor Dentition, Missing, Edentulous Upper, Edentulous Lower   Pulmonary sleep apnea , pneumonia, resolved,    Pulmonary exam normal breath sounds clear to auscultation       Cardiovascular Exercise Tolerance: Good hypertension, Normal cardiovascular exam Rhythm:regular Rate:Normal     Neuro/Psych PSYCHIATRIC DISORDERS Anxiety Depression Bipolar Disorder CVA, Residual Symptoms    GI/Hepatic Neg liver ROS, GERD  Controlled,  Endo/Other  diabetes, Type 2, Insulin Dependent  Renal/GU negative Renal ROS  negative genitourinary   Musculoskeletal   Abdominal   Peds  Hematology negative hematology ROS (+)   Anesthesia Other Findings Past Medical History: No date: Anxiety No date: Bipolar disorder (HCC) No date: Bullous pemphigoid No date: 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     Comment: "left; see pain dr for that" (02/22/2013) No date: Depression No date: Family history of anesthesia complication     Comment: "daughter's w/PONV" (02/22/2013) No date: Frequent UTI No date: GERD (gastroesophageal reflux disease) No date: Gout attack     Comment: "twice" (02/22/2013) No date: High cholesterol No date: Hypertension 1990's: Pneumonia     Comment: "once" No date: PONV (postoperative nausea and vomiting)     Comment: "makes me violently ill" (02/22/2013) No date: Sleep apnea     Comment: "dx'd in the 1990's; don't wear mask"               (02/22/2013) 2010; 02/2012: Stroke (HCC)     Comment: denies residual; "balance issues since & weak    on left side since" (02/22/2013) No date: Type II diabetes mellitus (HCC) No date: Vertigo  Past Surgical History: 1989: ANTERIOR CERVICAL DECOMP/DISCECTOMY FUSION 1984: APPENDECTOMY ~ 2008: CATARACT EXTRACTION W/ INTRAOCULAR LENS  IMPLA* Bilateral 1984: CHOLECYSTECTOMY ~ 1988: POSTERIOR FUSION CERVICAL SPINE 1973: TUBAL LIGATION ~ 1985: WRIST SURGERY Right     Comment: "tied off damagaed nerve" (02/22/2013)     Reproductive/Obstetrics negative OB ROS                             Anesthesia Physical Anesthesia Plan  ASA: III  Anesthesia Plan: General   Post-op Pain Management:    Induction:   Airway Management Planned:   Additional Equipment:   Intra-op Plan:   Post-operative Plan:   Informed Consent: I have reviewed the patients History and Physical, chart, labs and discussed the procedure including the risks, benefits and alternatives for the proposed anesthesia with the patient or authorized representative who has indicated his/her understanding and acceptance.   Dental Advisory Given  Plan Discussed with: Anesthesiologist, CRNA and Surgeon  Anesthesia Plan Comments: (Patient and family informed that patient is higher risk for complications from anesthesia during this procedure due to their medical history and age including but not limited to post operative cognitive dysfunction.  They voiced understanding. )       Anesthesia Quick Evaluation

## 2015-11-09 NOTE — H&P (Signed)
Outpatient short stay form Pre-procedure 11/09/2015 1:39 PM Sylvia Black,  Sylvia Gross, MD  Primary Physician: Emogene Morgan, MD   Reason for visit:  No chief complaint on file.   No diagnosis found.   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 No history of pancreatitis. No abd pain. 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 Not yet seen by GYN.  Still having N/V for past 6 months.    Current Facility-Administered Medications:  .  0.9 %  sodium chloride infusion, , Intravenous, Continuous, Rayann Heman, MD  Prescriptions Prior to Admission  Medication Sig Dispense Refill Last Dose  . diclofenac sodium (VOLTAREN) 1 % GEL Apply topically 4 (four) times daily.   Past Week at Unknown time  . ammonium lactate (LAC-HYDRIN) 12 % lotion Apply twice a day to feet bilaterally 400 g 0   . atorvastatin (LIPITOR) 40 MG tablet Take 1 tablet (40 mg total) by mouth daily at 6 PM. 30 tablet 0   . buPROPion (WELLBUTRIN XL) 150 MG 24 hr tablet Take 150 mg by mouth 2 (two) times daily.    08/05/2015 at Unknown time  . cephALEXin (KEFLEX) 250 MG capsule Take 1 capsule (250 mg total) by mouth every 8 (eight) hours. X 5 more days 15 capsule 0   . clobetasol cream (TEMOVATE) 0.05 % Apply 1 application topically daily as needed.   08/05/2015 at Unknown time  . clopidogrel (PLAVIX) 75 MG tablet Take 1 tablet (75 mg total) by mouth daily. 30 tablet 0 08/05/2015 at Unknown time  . insulin aspart (NOVOLOG) 100 UNIT/ML FlexPen Inject 4 Units into the skin 3 (three) times daily with meals. 15 mL 11   . Insulin Glargine (LANTUS) 100 UNIT/ML Solostar Pen Inject 30 Units into the skin at bedtime. 15 mL 11   . Insulin Pen Needle (CARETOUCH PEN NEEDLES) 32G X 4 MM MISC Use with flex pens for insulin 100 each 11   . lidocaine (LIDODERM) 5 % Place 1 patch onto the skin daily. Remove & Discard patch within 12 hours or as directed by MD   08/07/2015 at Unknown time  .  metoCLOPramide (REGLAN) 10 MG tablet Take 1 tablet (10 mg total) by mouth 4 (four) times daily -  before meals and at bedtime. 120 tablet 0   . predniSONE (DELTASONE) 5 MG tablet Take 1 tablet (5 mg total) by mouth daily with breakfast. 30 tablet 0   . tiZANidine (ZANAFLEX) 4 MG tablet Take 1 tablet (4 mg total) by mouth 2 (two) times daily. 30 tablet 0     Allergies  Allergen Reactions  . Ciprofloxacin     Other reaction(s): Hallucinations  . Latex   . Penicillins Hives    Has patient had a PCN reaction causing immediate rash, facial/tongue/throat swelling, SOB or lightheadedness with hypotension: Yes Has patient had a PCN reaction causing severe rash involving mucus membranes or skin necrosis: No Has patient had a PCN reaction that required hospitalization Yes Has patient had a PCN reaction occurring within the last 10 years: No If all of the above answers are "NO", then may proceed with Cephalosporin use.  . Sulfa Antibiotics     "hives"  . Tetanus Toxoids     "I had a strong reaction".    Past Medical History:  Diagnosis Date  . Anxiety   . Bipolar disorder (HCC)   . Bullous pemphigoid   . Chronic  pain in shoulder    "left; see pain dr for that" (02/22/2013)  . Depression   . Family history of anesthesia complication    "daughter's w/PONV" (02/22/2013)  . Frequent UTI   . GERD (gastroesophageal reflux disease)   . Gout attack    "twice" (02/22/2013)  . High cholesterol   . Hypertension   . Pneumonia 1990's   "once"  . PONV (postoperative nausea and vomiting)    "makes me violently ill" (02/22/2013)  . Sleep apnea    "dx'd in the 1990's; don't wear mask" (02/22/2013)  . Stroke [facility](HCC) 2010; 02/2012   denies residual; "balance issues since & weak on left side since" (02/22/2013)  . Type II diabetes mellitus (HCC)   . Vertigo     Review of systems:      Physical Exam  Alert  NAD   Heart and lungs: RRR and clear       Pertinant exam for procedure: Abd soft  nt      Planned proceedures: Upper EUS with EGD.  NOTE: I discussed with patient and daughter that I don't think the pancreas cysts are related to her symptoms and they are unlikely to cause a problem in near future and that her pelvic mass is the major concern.  I would consider canceling procedure if it was for the cyst evaluation alone.  However she clearly has significant ongoing n/v since about June that is distressing to her.   I think therefore doing the EGD as part of the EUS is appropriate indication for her procedure and I also discussed with anesthesiologist who felt ok going ahead with procedure. Both patient and daughter understand she is higher risk but would like to go ahead. I also explained that after the EGD, if she is doing well will continue with the EUS to look at the cysts but do not plan to sample unless worrisome finding such as a solid component or mural nodule. They are in agreement.    Emily FilbertPaul S Bradan Congrove MD Gastroenterology 11/09/2015  1:39 PM

## 2015-11-09 NOTE — Op Note (Signed)
[facility] Gastroenterology Patient Name: Sylvia Black Procedure Date: 11/09/2015 1:49 PM MRN: [id number] Account #: [id number] Date of Birth: 02/27/1945 Admit Type: Outpatient Age: [age] [facility]OM 3 Gender: Female Note Status: Finalized Procedure:            Upper EUS Indications:          Pancreatic cyst on CT scan, Nausea with vomiting Providers:            Emily FilbertPaul S. Carlon Davidson Referring MD:         Christena DeemMartin U. Skulskie, MD (Referring MD) Medicines:            General Anesthesia Complications:        No immediate complications. Procedure:            Pre-Anesthesia Assessment:                       - Please see pre-anesthesia assessment documentation                        already completed in Epic.                       After obtaining informed consent, the endoscope was                        passed under direct vision. Throughout the procedure,                        the patient's blood pressure, pulse, and oxygen                        saturations were monitored continuously. The EUS GI                        linear Array [id number] was introduced through the mouth,                        and advanced to the duodenum for ultrasound examination                        from the esophagus, stomach and duodenum. The Endoscope                        was introduced through the mouth, and advanced to the                        second part of duodenum. The upper EUS was accomplished                        without difficulty. The patient tolerated the procedure                        well. Findings:      Endoscopic Finding :      The examined esophagus was endoscopically normal.      The entire examined stomach was endoscopically normal.      Diffuse mild mucosal changes characterized by mild flattening of the       folds were found in the second portion of the duodenum. Biopsies were       taken with a cold  forceps for histology.      Endosonographic Finding :  An anechoic lesion suggestive of a cyst was identified in the pancreatic       tail. It is not in obvious communication with the pancreatic duct. The       lesion measured 12 mm by 10 mm in maximal cross-sectional diameter.       There was a single compartment without septae. The outer wall of the       lesion was not seen. There was no associated mass. There was no internal       debris within the fluid-filled cavity.      An anechoic lesion suggestive of a cyst was identified in the pancreatic       head. It is not in obvious communication with the pancreatic duct. The       lesion measured 13 mm by 7 mm in maximal cross-sectional diameter. There       was a single compartment without septae. The outer wall of the lesion       was not seen. There was no associated mass. There was no internal debris       within the fluid-filled cavity.      An anechoic lesion suggestive of a cyst was identified in the uncinate       process of the pancreas. It is not in obvious communication with the       pancreatic duct. The lesion measured 16 mm by 14 mm in maximal       cross-sectional diameter. There was a single compartment without septae.       The outer wall of the lesion was not seen. There was no associated mass.       There was no internal debris within the fluid-filled cavity.      Pancreatic parenchymal abnormalities were noted in the entire pancreas.       These consisted of atrophy and hyperechoic strands. Pancreatic duct was       normal - 2 mm in the head and 1.2 mm in the body.      There was mild dilation in the common bile duct and in the common       hepatic duct which measured up to 9 mm.      Endosonographic imaging in the visualized portion of the liver showed no       lesion.      No abnormal-appearing lymph nodes were seen during endosonographic       examination in the celiac region (level 20) and in the peripancreatic       region. Impression:           - Normal esophagus.                        - Normal stomach.                       - Mucosal changes in the duodenum. Biopsied.                       EUS:                       - A cystic lesion was seen in the pancreatic tail.                        Tissue has  not been obtained. 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                       - A cystic lesion was seen in the pancreatic head.                        Tissue has not been obtained. 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                       - A cystic lesion was seen in the uncinate process of                        the pancreas. Tissue has not been obtained. 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                       - The differential diagnosis of all the cystic lesions                        includes benign cysts and other cystic neoplasms. No                        worrisome features on exam and based on this as well as                        size less than 2 cm and the possibility of a pelvic                        malignancy (currently being worked up), no indication                        for FNA.                       - Pancreatic parenchymal abnormalities consisting of                        atrophy and hyperechoic strands were noted in the                        entire pancreas.                       - There was dilation in the  common bile duct and in the                        common hepatic duct which measured up to 9 mm. Recommendation:       - Await path results.                       - Consider cyst surveillance with MRCP/MRI (or CT if                        she cannot have an MRI) in one year  depending on                        overall medical condition.                       - Return to referring physician.                       - The findings and recommendations were discussed with                        the designated responsible adult. Procedure Code(s):    --- Professional ---                       [phone], Esophagogastroduodenoscopy, flexible, transoral;                        with endoscopic ultrasound examination limited to the                        esophagus, stomach or duodenum, and adjacent structures                       43239, 59, Esophagogastroduodenoscopy, flexible,                        transoral; with biopsy, single or multiple Diagnosis Code(s):    --- Professional ---                       K86.2, Cyst of pancreas                       K31.89, Other diseases of stomach and duodenum                       K86.9, Disease of pancreas, unspecified                       R11.2, Nausea with vomiting, unspecified                       K83.8, Other specified diseases of biliary tract CPT copyright 2016 [employer]. All rights reserved. The codes documented in this report are preliminary and upon coder review may  be revised to meet current compliance requirements. Emily Filbert,  11/09/2015 3:16:36 PM This report has been signed electronically. Number of Addenda: 0 Note Initiated On: 11/09/2015 1:49 PM      [facility]

## 2015-11-09 NOTE — Anesthesia Procedure Notes (Signed)
Procedure Name: Intubation Performed by: Chelcy Bolda Pre-anesthesia Checklist: Patient identified, Patient being monitored, Timeout performed, Emergency Drugs available and Suction available Patient Re-evaluated:Patient Re-evaluated prior to inductionOxygen Delivery Method: Circle system utilized Preoxygenation: Pre-oxygenation with 100% oxygen Intubation Type: IV induction, Rapid sequence and Cricoid Pressure applied Laryngoscope Size: Miller and 2 Grade View: Grade I Tube type: Oral Tube size: 7.0 mm Number of attempts: 1 Airway Equipment and Method: Stylet Placement Confirmation: ETT inserted through vocal cords under direct vision,  positive ETCO2 and breath sounds checked- equal and bilateral Secured at: 21 cm Tube secured with: Tape Dental Injury: Teeth and Oropharynx as per pre-operative assessment        

## 2015-11-10 NOTE — Anesthesia Postprocedure Evaluation (Signed)
Anesthesia Post Note  Patient: Sylvia Black  Procedure(s) Performed: Procedure(s) (LRB): UPPER ESOPHAGEAL ENDOSCOPIC ULTRASOUND (EUS) (N/A)  Patient location during evaluation: PACU Anesthesia Type: General Level of consciousness: awake and alert and oriented Pain management: pain level controlled Vital Signs Assessment: post-procedure vital signs reviewed and stable Respiratory status: spontaneous breathing Cardiovascular status: blood pressure returned to baseline Anesthetic complications: no    Last Vitals:  Vitals:   11/09/15 1524 11/09/15 1535  BP: 125/89 135/67  Pulse: 96 94  Resp: 13 19  Temp:  (!) 36.1 C    Last Pain:  Vitals:   11/10/15 0736  TempSrc:   PainSc: 0-No pain                 Kenyon Eshleman

## 2015-11-11 ENCOUNTER — Encounter: Payer: Self-pay | Admitting: Gastroenterology

## 2015-11-13 LAB — SURGICAL PATHOLOGY

## 2015-11-16 NOTE — Progress Notes (Signed)
  Oncology Nurse Navigator Documentation EUS report and pathology report routed to Tawni PummelJanice Woodard at [facility]. Navigator Location: [facility] (11/16/15 1400)   )Navigator Encounter Type: Letter/Fax/Email;Diagnostic Results (11/16/15 1400)                                                    Time Spent with Patient: 15 (11/16/15 1400)

## 2016-01-20 ENCOUNTER — Encounter: Payer: Self-pay | Admitting: Emergency Medicine

## 2016-01-20 ENCOUNTER — Emergency Department: Payer: Medicare HMO

## 2016-01-20 ENCOUNTER — Inpatient Hospital Stay
Admission: EM | Admit: 2016-01-20 | Discharge: 2016-01-25 | DRG: 637 | Disposition: A | Discharge status: Home / Self Care | Payer: Medicare HMO | Attending: Internal Medicine | Admitting: Internal Medicine

## 2016-01-20 DIAGNOSIS — Z87440 Personal history of urinary (tract) infections: Secondary | ICD-10-CM

## 2016-01-20 DIAGNOSIS — Z833 Family history of diabetes mellitus: Secondary | ICD-10-CM

## 2016-01-20 DIAGNOSIS — Z881 Allergy status to other antibiotic agents status: Secondary | ICD-10-CM

## 2016-01-20 DIAGNOSIS — R571 Hypovolemic shock: Secondary | ICD-10-CM | POA: Diagnosis present

## 2016-01-20 DIAGNOSIS — Z882 Allergy status to sulfonamides status: Secondary | ICD-10-CM

## 2016-01-20 DIAGNOSIS — K859 Acute pancreatitis without necrosis or infection, unspecified: Secondary | ICD-10-CM

## 2016-01-20 DIAGNOSIS — R32 Unspecified urinary incontinence: Secondary | ICD-10-CM | POA: Diagnosis present

## 2016-01-20 DIAGNOSIS — I10 Essential (primary) hypertension: Secondary | ICD-10-CM | POA: Diagnosis present

## 2016-01-20 DIAGNOSIS — K219 Gastro-esophageal reflux disease without esophagitis: Secondary | ICD-10-CM | POA: Diagnosis present

## 2016-01-20 DIAGNOSIS — E1010 Type 1 diabetes mellitus with ketoacidosis without coma: Principal | ICD-10-CM | POA: Diagnosis present

## 2016-01-20 DIAGNOSIS — F419 Anxiety disorder, unspecified: Secondary | ICD-10-CM | POA: Diagnosis present

## 2016-01-20 DIAGNOSIS — E1043 Type 1 diabetes mellitus with diabetic autonomic (poly)neuropathy: Secondary | ICD-10-CM | POA: Diagnosis present

## 2016-01-20 DIAGNOSIS — K529 Noninfective gastroenteritis and colitis, unspecified: Secondary | ICD-10-CM | POA: Diagnosis present

## 2016-01-20 DIAGNOSIS — E785 Hyperlipidemia, unspecified: Secondary | ICD-10-CM | POA: Diagnosis present

## 2016-01-20 DIAGNOSIS — F319 Bipolar disorder, unspecified: Secondary | ICD-10-CM | POA: Diagnosis present

## 2016-01-20 DIAGNOSIS — Z961 Presence of intraocular lens: Secondary | ICD-10-CM | POA: Diagnosis present

## 2016-01-20 DIAGNOSIS — Z8701 Personal history of pneumonia (recurrent): Secondary | ICD-10-CM

## 2016-01-20 DIAGNOSIS — Z66 Do not resuscitate: Secondary | ICD-10-CM | POA: Diagnosis present

## 2016-01-20 DIAGNOSIS — K3184 Gastroparesis: Secondary | ICD-10-CM | POA: Diagnosis present

## 2016-01-20 DIAGNOSIS — L22 Diaper dermatitis: Secondary | ICD-10-CM | POA: Diagnosis present

## 2016-01-20 DIAGNOSIS — Z91040 Latex allergy status: Secondary | ICD-10-CM

## 2016-01-20 DIAGNOSIS — Z794 Long term (current) use of insulin: Secondary | ICD-10-CM

## 2016-01-20 DIAGNOSIS — E876 Hypokalemia: Secondary | ICD-10-CM | POA: Diagnosis present

## 2016-01-20 DIAGNOSIS — Z880 Allergy status to penicillin: Secondary | ICD-10-CM | POA: Diagnosis not present

## 2016-01-20 DIAGNOSIS — R112 Nausea with vomiting, unspecified: Secondary | ICD-10-CM

## 2016-01-20 DIAGNOSIS — Z8673 Personal history of transient ischemic attack (TIA), and cerebral infarction without residual deficits: Secondary | ICD-10-CM

## 2016-01-20 DIAGNOSIS — E7800 Pure hypercholesterolemia, unspecified: Secondary | ICD-10-CM | POA: Diagnosis present

## 2016-01-20 DIAGNOSIS — Z7952 Long term (current) use of systemic steroids: Secondary | ICD-10-CM

## 2016-01-20 DIAGNOSIS — Z79899 Other long term (current) drug therapy: Secondary | ICD-10-CM

## 2016-01-20 DIAGNOSIS — M6281 Muscle weakness (generalized): Secondary | ICD-10-CM

## 2016-01-20 DIAGNOSIS — E1110 Type 2 diabetes mellitus with ketoacidosis without coma: Secondary | ICD-10-CM | POA: Diagnosis present

## 2016-01-20 LAB — GLUCOSE, CAPILLARY
GLUCOSE-CAPILLARY: 467 mg/dL — AB (ref 65–99)
Glucose-Capillary: 379 mg/dL — ABNORMAL HIGH (ref 65–99)
Glucose-Capillary: 347 mg/dL — ABNORMAL HIGH (ref 65–99)
Glucose-Capillary: 330 mg/dL — ABNORMAL HIGH (ref 65–99)
Glucose-Capillary: 297 mg/dL — ABNORMAL HIGH (ref 65–99)

## 2016-01-20 LAB — URINALYSIS, COMPLETE (UACMP) WITH MICROSCOPIC
BACTERIA UA: NONE SEEN
BILIRUBIN URINE: NEGATIVE
Glucose, UA: >=500 mg/dL — AB
KETONES UR: 80 mg/dL — AB
Leukocytes, UA: NEGATIVE
Nitrite: NEGATIVE
Protein, ur: 30 mg/dL — AB
Specific Gravity, Urine: 1.022 (ref 1.005–1.030)
pH: 6 (ref 5.0–8.0)

## 2016-01-20 LAB — CBC WITH DIFFERENTIAL/PLATELET
Basophils Absolute: 0.1 10*3/uL (ref 0–0.1)
Basophils Relative: 1 %
Eosinophils Absolute: 0 10*3/uL (ref 0–0.7)
Eosinophils Relative: 0 %
HCT: 37.4 % (ref 35.0–47.0)
HEMOGLOBIN: 12.4 g/dL (ref 12.0–16.0)
LYMPHS ABS: 3.4 10*3/uL (ref 1.0–3.6)
LYMPHS PCT: 29 %
MCH: 29.8 pg (ref 26.0–34.0)
MCHC: 33.2 g/dL (ref 32.0–36.0)
MCV: 89.8 fL (ref 80.0–100.0)
Monocytes Absolute: 1.1 10*3/uL — ABNORMAL HIGH (ref 0.2–0.9)
Monocytes Relative: 9 %
NEUTROS PCT: 61 %
Neutro Abs: 7.1 10*3/uL — ABNORMAL HIGH (ref 1.4–6.5)
Platelets: 425 10*3/uL (ref 150–440)
RBC: 4.16 MIL/uL (ref 3.80–5.20)
RDW: 14.7 % — ABNORMAL HIGH (ref 11.5–14.5)
WBC: 11.6 10*3/uL — AB (ref 3.6–11.0)

## 2016-01-20 LAB — LIPASE, BLOOD: LIPASE: 289 U/L — AB (ref 11–51)

## 2016-01-20 LAB — TROPONIN I

## 2016-01-20 LAB — COMPREHENSIVE METABOLIC PANEL
ALT: 9 U/L — ABNORMAL LOW (ref 14–54)
AST: 20 U/L (ref 15–41)
Albumin: 2.5 g/dL — ABNORMAL LOW (ref 3.5–5.0)
Alkaline Phosphatase: 80 U/L (ref 38–126)
Anion gap: 17 — ABNORMAL HIGH (ref 5–15)
BUN: 7 mg/dL (ref 6–20)
CHLORIDE: 101 mmol/L (ref 101–111)
CO2: 14 mmol/L — AB (ref 22–32)
Calcium: 9.5 mg/dL (ref 8.9–10.3)
Creatinine, Ser: 0.88 mg/dL (ref 0.44–1.00)
Glucose, Bld: 370 mg/dL — ABNORMAL HIGH (ref 65–99)
POTASSIUM: 3.5 mmol/L (ref 3.5–5.1)
SODIUM: 132 mmol/L — AB (ref 135–145)
Total Bilirubin: 2.4 mg/dL — ABNORMAL HIGH (ref 0.3–1.2)
Total Protein: 6.9 g/dL (ref 6.5–8.1)

## 2016-01-20 MED ORDER — ONDANSETRON HCL 4 MG/2ML IJ SOLN
4.0000 mg | Freq: Once | INTRAMUSCULAR | Status: AC
  Administered 2016-01-20: 4 mg via INTRAVENOUS
  Filled 2016-01-20: qty 2

## 2016-01-20 MED ORDER — SODIUM CHLORIDE 0.9 % IV SOLN
INTRAVENOUS | Status: DC
  Administered 2016-01-20: 2.4 [IU]/h via INTRAVENOUS
  Filled 2016-01-20: qty 2.5

## 2016-01-20 MED ORDER — DEXTROSE-NACL 5-0.45 % IV SOLN
INTRAVENOUS | Status: DC
  Administered 2016-01-21: 03:00:00 via INTRAVENOUS

## 2016-01-20 MED ORDER — SODIUM CHLORIDE 0.9 % IV BOLUS (SEPSIS)
1000.0000 mL | Freq: Once | INTRAVENOUS | Status: AC
  Administered 2016-01-20: 1000 mL via INTRAVENOUS

## 2016-01-20 MED ORDER — INSULIN ASPART 100 UNIT/ML ~~LOC~~ SOLN
8.0000 [IU] | Freq: Once | SUBCUTANEOUS | Status: AC
  Administered 2016-01-20: 8 [IU] via INTRAVENOUS
  Filled 2016-01-20: qty 8

## 2016-01-20 MED ORDER — SODIUM CHLORIDE 0.9 % IV SOLN
INTRAVENOUS | Status: DC
  Administered 2016-01-20: via INTRAVENOUS

## 2016-01-20 MED ORDER — SODIUM CHLORIDE 0.9 % IV BOLUS (SEPSIS)
500.0000 mL | Freq: Once | INTRAVENOUS | Status: AC
  Administered 2016-01-20: 500 mL via INTRAVENOUS

## 2016-01-20 MED ORDER — SODIUM CHLORIDE 0.9 % IV SOLN: INTRAVENOUS | Status: AC

## 2016-01-20 NOTE — ED Triage Notes (Signed)
Pt to ED by EMS after daughter called stating the pt was altered. Pt is diabetic and daughter told EMS that the Pt's CBG was in the 400s yesterday. Upon EMS arrival the's pt's CBG was 527. Pt appears disoriented and is not answering questions.

## 2016-01-20 NOTE — H&P (Signed)
[facility] - [place] at [facility]   PATIENT NAME: Sylvia Black    MR#:  [id number]  DATE OF BIRTH:  October 27, 1945  DATE OF ADMISSION:  01/20/2016  PRIMARY CARE PHYSICIAN: Emogene Morgan, MD   REQUESTING/REFERRING PHYSICIAN: Huel Cote, MD  CHIEF COMPLAINT:   Chief Complaint  Patient presents with  . Hyperglycemia    HISTORY OF PRESENT ILLNESS:  Sylvia Black  is a 70 y.o. female who presents with Altered mental status. Here in the ED she was found to be in DKA. She is unable to contribute significantly to her history of present illness given her confusion. She was brought here by her daughter who states that her blood sugar has been running high for the last few days. Hospitalists were called for admission  PAST MEDICAL HISTORY:   Past Medical History:  Diagnosis Date  . Anxiety   . Bipolar disorder (HCC)   . Bullous pemphigoid   . 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    "left; see pain dr for that" (02/22/2013)  . Depression   . Family history of anesthesia complication    "daughter's w/PONV" (02/22/2013)  . Frequent UTI   . GERD (gastroesophageal reflux disease)   . Gout attack    "twice" (02/22/2013)  . High cholesterol   . Hypertension   . Pneumonia 1990's   "once"  . PONV (postoperative nausea and vomiting)    "makes me violently ill" (02/22/2013)  . Sleep apnea    "dx'd in the 1990's; don't wear mask" (02/22/2013)  . Stroke [facility]) 2010; 02/2012   denies residual; "balance issues since & weak on left side since" (02/22/2013)  . Type II diabetes mellitus (HCC)   . Vertigo     PAST SURGICAL HISTORY:   Past Surgical History:  Procedure Laterality Date  . ANTERIOR CERVICAL DECOMP/DISCECTOMY FUSION  1989  . APPENDECTOMY  1984  . CATARACT EXTRACTION W/ INTRAOCULAR LENS  IMPLANT, BILATERAL Bilateral ~ 2008  . CHOLECYSTECTOMY  1984  . POSTERIOR FUSION CERVICAL SPINE  ~ 1988  . TUBAL LIGATION  1973  . UPPER ESOPHAGEAL ENDOSCOPIC ULTRASOUND (EUS) N/A 11/09/2015   Procedure: UPPER ESOPHAGEAL ENDOSCOPIC ULTRASOUND (EUS);  Surgeon: Rayann Heman, MD;  [facility] END[facility];  Service: Endoscopy;  Laterality: N/A;  . WRIST SURGERY Right ~ 1985   "tied off damagaed nerve" (02/22/2013)    SOCIAL HISTORY:   Social History  Substance Use Topics  . Smoking status: Passive Smoke Exposure - Never Smoker  . Smokeless tobacco: Never Used  . Alcohol use Yes     Comment: 02/22/2013 "mixed drink on special occasions; glass of wine q 2-3 months"    FAMILY HISTORY:   Family History  Problem Relation Age of Onset  . Heart disease Mother   . Diabetes Mother   . Heart disease Father     DRUG ALLERGIES:   Allergies  Allergen Reactions  . Ciprofloxacin     Other reaction(s): Hallucinations  . Latex   . Penicillins Hives    Has patient had a PCN reaction causing immediate rash, facial/tongue/throat swelling, SOB or lightheadedness with hypotension: Yes Has patient had a PCN reaction causing severe rash involving mucus membranes or skin necrosis: No Has patient had a PCN reaction that required hospitalization Yes Has patient had a PCN reaction occurring within the last 10 years: No If all of the above answers are "NO", then may proceed with Cephalosporin use.  . Sulfa Antibiotics     "hives"  . Tetanus Toxoids     "  I had a strong reaction".    MEDICATIONS AT HOME:   Prior to Admission medications   Medication Sig Start Date End Date Taking? Authorizing Provider  pantoprazole (PROTONIX) 40 MG tablet Take 40 mg by mouth 2 (two) times daily.   Yes Historical Provider, MD  ammonium lactate (LAC-HYDRIN) 12 % lotion Apply twice a day to feet bilaterally 07/07/15   Alford Highlandichard Wieting, MD  atorvastatin (LIPITOR) 40 MG tablet Take 1 tablet (40 mg total) by mouth daily at 6 PM. 07/07/15   Alford Highlandichard Wieting, MD  buPROPion (WELLBUTRIN XL) 150 MG 24 hr tablet Take 150 mg by mouth 2 (two) times daily.     Historical Provider, MD  clobetasol cream (TEMOVATE) 0.05 % Apply 1  application topically daily as needed.    Historical Provider, MD  clopidogrel (PLAVIX) 75 MG tablet Take 1 tablet (75 mg total) by mouth daily. 07/07/15   Alford Highlandichard Wieting, MD  diclofenac sodium (VOLTAREN) 1 % GEL Apply topically 4 (four) times daily.    Historical Provider, MD  insulin aspart (NOVOLOG) 100 UNIT/ML FlexPen Inject 4 Units into the skin 3 (three) times daily with meals. 08/09/15   Enid Baasadhika Kalisetti, MD  Insulin Glargine (LANTUS) 100 UNIT/ML Solostar Pen Inject 30 Units into the skin at bedtime. 08/09/15   Enid Baasadhika Kalisetti, MD  Insulin Pen Needle (CARETOUCH PEN NEEDLES) 32G X 4 MM MISC Use with flex pens for insulin 08/09/15   Enid Baasadhika Kalisetti, MD  lidocaine (LIDODERM) 5 % Place 1 patch onto the skin daily. Remove & Discard patch within 12 hours or as directed by MD    Historical Provider, MD  metoCLOPramide (REGLAN) 10 MG tablet Take 1 tablet (10 mg total) by mouth 4 (four) times daily -  before meals and at bedtime. 07/07/15   Alford Highlandichard Wieting, MD  predniSONE (DELTASONE) 5 MG tablet Take 1 tablet (5 mg total) by mouth daily with breakfast. 07/07/15   Alford Highlandichard Wieting, MD  tiZANidine (ZANAFLEX) 4 MG tablet Take 1 tablet (4 mg total) by mouth 2 (two) times daily. 08/09/15   Enid Baasadhika Kalisetti, MD    REVIEW OF SYSTEMS:  Review of Systems  Unable to perform ROS: Acuity of condition     VITAL SIGNS:   Vitals:   01/20/16 1940 01/20/16 2000 01/20/16 2100 01/20/16 2130  BP:  (!) 175/78 (!) 159/50 (!) 158/41  Pulse:  88 86 93  Resp:  (!) 21 18 17   Temp: 98.8 F (37.1 C)     TempSrc: Rectal     SpO2:  100% 99% 99%  Weight:      Height:       Wt Readings from Last 3 Encounters:  01/20/16 78.9 kg (173 lb 15.1 oz)  08/06/15 117.9 kg (260 lb)  07/07/15 105.2 kg (232 lb)    PHYSICAL EXAMINATION:  Physical Exam  Vitals reviewed. Constitutional: She appears well-developed and well-nourished. No distress.  HENT:  Head: Normocephalic and atraumatic.  Mouth/Throat: Oropharynx is clear  and moist.  Eyes: Conjunctivae and EOM are normal. Pupils are equal, round, and reactive to light. No scleral icterus.  Neck: Normal range of motion. Neck supple. No JVD present. No thyromegaly present.  Cardiovascular: Normal rate, regular rhythm and intact distal pulses.  Exam reveals no gallop and no friction rub.   No murmur heard. Respiratory: Effort normal and breath sounds normal. No respiratory distress. She has no wheezes. She has no rales.  GI: Soft. Bowel sounds are normal. She exhibits no distension. There is no tenderness.  Musculoskeletal: Normal range of motion. She exhibits no edema.  No arthritis, no gout  Lymphadenopathy:    She has no cervical adenopathy.  Neurological: She is alert. No cranial nerve deficit.  Patient follows commands and can answer simple questions, but is unable to answer any significant meaning. No dysarthria, no aphasia  Skin: Skin is warm and dry. No rash noted. No erythema.  Psychiatric:  Unable to assess due to patient condition    LABORATORY PANEL:   CBC  Recent Labs Lab 01/20/16 1913  WBC 11.6*  HGB 12.4  HCT 37.4  PLT 425   ------------------------------------------------------------------------------------------------------------------  Chemistries   Recent Labs Lab 01/20/16 1913  NA 132*  K 3.5  CL 101  CO2 14*  GLUCOSE 370*  BUN 7  CREATININE 0.88  CALCIUM 9.5  AST 20  ALT 9*  ALKPHOS 80  BILITOT 2.4*   ------------------------------------------------------------------------------------------------------------------  Cardiac Enzymes  Recent Labs Lab 01/20/16 1913  TROPONINI <0.03   ------------------------------------------------------------------------------------------------------------------  RADIOLOGY:  Dg Chest 2 View  Result Date: 01/20/2016 CLINICAL DATA:  Hyperglycemia; Pt to ED by EMS after daughter called stating the pt was altered. Pt is diabetic and daughter told EMS that the Pt's CBG was in  the 400s yesterday. Upon EMS arrival the's pt's CBG was 527. Patient appears disoriented. EXAM: CHEST  2 VIEW COMPARISON:  08/07/2015 FINDINGS: Heart is mildly enlarged. There are no focal consolidations or pleural effusions. No pulmonary edema. Stable elevation of right hemidiaphragm. Remote anterior fusion of the cervical spine. IMPRESSION: No evidence for acute cardiopulmonary abnormality. Electronically Signed   By: Norva Pavlov M.D.   On: 01/20/2016 18:49    EKG:   Orders placed or performed during the hospital encounter of 01/20/16  . EKG 12-Lead  . EKG 12-Lead  . ED EKG  . ED EKG    IMPRESSION AND PLAN:  Principal Problem:   Diabetic ketoacidosis without coma associated with type 2 diabetes mellitus (HCC) - anion gap 17 bicarbonate of 14. We'll admit her to step down unit with DKA insulin drip orders. Active Problems:   Hypertension - stable, continue home meds   Anxiety - home dose anxiolytics   HLD (hyperlipidemia) - home dose statin   GERD (gastroesophageal reflux disease) - home dose PPI  All the records are reviewed and case discussed with ED provider. Management plans discussed with the patient and/or family.  DVT PROPHYLAXIS: SubQ lovenox  GI PROPHYLAXIS: PPI  ADMISSION STATUS: Inpatient  CODE STATUS: DNR Code Status History    Date Active Date Inactive Code Status Order ID Comments User Context   08/06/2015  6:17 PM 08/09/2015  8:04 PM DNR [id number]  Houston Siren, MD Inpatient   07/01/2015 10:51 AM 07/07/2015  9:40 PM DNR [id number]  Erin Fulling, MD ED   07/01/2015 10:31 AM 07/01/2015 10:51 AM Full Code [id number]  Erin Fulling, MD ED   02/22/2013  4:38 AM 02/23/2013  4:50 PM Full Code [id number]  Dorothea Ogle, MD Inpatient    Questions for Most Recent Historical Code Status (Order [id number])    Question Answer Comment   In the event of cardiac or respiratory ARREST Do not call a "code blue"    In the event of cardiac or respiratory ARREST Do not perform Intubation,  CPR, defibrillation or ACLS    In the event of cardiac or respiratory ARREST Use medication by any route, position, wound care, and other measures to relive pain and suffering. May use oxygen, suction and manual treatment  of airway obstruction as needed for comfort.         Advance Directive Documentation   Flowsheet Row Most Recent Value  Type of Advance Directive  Out of facility DNR (pink MOST or yellow form), Healthcare Power of Attorney  Pre-existing out of facility DNR order (yellow form or pink MOST form)  Yellow form placed in chart (order not valid for inpatient use)  "MOST" Form in Place?  No data      TOTAL TIME TAKING CARE OF THIS PATIENT: 45 minutes.    Taylie Helder FIELDING 01/20/2016, 9:57 PM  [facility]  Office  [phone]  CC: Primary care physician; Emogene MorganAYCOCK, NGWE A, MD

## 2016-01-20 NOTE — ED Provider Notes (Signed)
Time Seen: Approximately 1746  I have reviewed the triage notes  Chief Complaint: Hyperglycemia   History of Present Illness: Sylvia Black is a 70 y.o. female who presents with some persistently elevated blood sugars at home. The patient's had this for the last 2 days ago according to her daughter has a long history of labile blood sugars. Patient has had her normal insulin dosages up until today. Her daughter states she did not receive any insulin today because patient had "" a decreased appetite and wasn't able to eat  anything "". Patient herself denies any chest pain or shortness of breath. Patient describes some frequent urination without burning or hematuria. She's had some persistent nausea and occasional vomiting at home   Past Medical History:  Diagnosis Date  . Anxiety   . Bipolar disorder (HCC)   . Bullous pemphigoid   . 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    "left; see pain dr for that" (02/22/2013)  . Depression   . Family history of anesthesia complication    "daughter's w/PONV" (02/22/2013)  . Frequent UTI   . GERD (gastroesophageal reflux disease)   . Gout attack    "twice" (02/22/2013)  . High cholesterol   . Hypertension   . Pneumonia 1990's   "once"  . PONV (postoperative nausea and vomiting)    "makes me violently ill" (02/22/2013)  . Sleep apnea    "dx'd in the 1990's; don't wear mask" (02/22/2013)  . Stroke [facility](HCC) 2010; 02/2012   denies residual; "balance issues since & weak on left side since" (02/22/2013)  . Type II diabetes mellitus (HCC)   . Vertigo     Patient Active Problem List   Diagnosis Date Noted  . HLD (hyperlipidemia) 01/20/2016  . GERD (gastroesophageal reflux disease) 01/20/2016  . Anxiety 01/20/2016  . DKA (diabetic ketoacidoses) (HCC) 01/20/2016  . Depression 07/04/2015  . Altered mental status   . Diabetic ketoacidosis without coma associated with type 2 diabetes mellitus (HCC)   . Encounter for central line placement   . Acidosis 07/01/2015  .  Stroke (HCC)   . Hypertension   . Frequent UTI   . Diabetes mellitus without complication (HCC)   . Hypoglycemia 02/21/2013    Past Surgical History:  Procedure Laterality Date  . ANTERIOR CERVICAL DECOMP/DISCECTOMY FUSION  1989  . APPENDECTOMY  1984  . CATARACT EXTRACTION W/ INTRAOCULAR LENS  IMPLANT, BILATERAL Bilateral ~ 2008  . CHOLECYSTECTOMY  1984  . POSTERIOR FUSION CERVICAL SPINE  ~ 1988  . TUBAL LIGATION  1973  . UPPER ESOPHAGEAL ENDOSCOPIC ULTRASOUND (EUS) N/A 11/09/2015   Procedure: UPPER ESOPHAGEAL ENDOSCOPIC ULTRASOUND (EUS);  Surgeon: Rayann HemanPaul Jowell, MD;  [facility];  Service: Endoscopy;  Laterality: N/A;  . WRIST SURGERY Right ~ 1985   "tied off damagaed nerve" (02/22/2013)    Past Surgical History:  Procedure Laterality Date  . ANTERIOR CERVICAL DECOMP/DISCECTOMY FUSION  1989  . APPENDECTOMY  1984  . CATARACT EXTRACTION W/ INTRAOCULAR LENS  IMPLANT, BILATERAL Bilateral ~ 2008  . CHOLECYSTECTOMY  1984  . POSTERIOR FUSION CERVICAL SPINE  ~ 1988  . TUBAL LIGATION  1973  . UPPER ESOPHAGEAL ENDOSCOPIC ULTRASOUND (EUS) N/A 11/09/2015   Procedure: UPPER ESOPHAGEAL ENDOSCOPIC ULTRASOUND (EUS);  Surgeon: Rayann HemanPaul Jowell, MD;  [facility];  Service: Endoscopy;  Laterality: N/A;  . WRIST SURGERY Right ~ 1985   "tied off damagaed nerve" (02/22/2013)    Current Outpatient Rx  . Order #: [id number] Class: Print  . Order #: [id number] Class:  Print  . Order #: [id number] Class: Historical Med  . Order #: [id number] Class: Print  . Order #: [id number] Class: Historical Med  . Order #: [id number] Class: Normal  . Order #: [id number] Class: Normal  . Order #: [id number] Class: Historical Med  . Order #: [id number] Class: Historical Med  . Order #: [id number] Class: Historical Med  . Order #: [id number] Class: Normal  . Order #: [id number] Class: Print  . Order #: [id number] Class: Normal    Allergies:  Ciprofloxacin; Latex; Penicillins; Sulfa antibiotics; and Tetanus  toxoids  Family History: Family History  Problem Relation Age of Onset  . Heart disease Mother   . Diabetes Mother   . Heart disease Father     Social History: Social History  Substance Use Topics  . Smoking status: Passive Smoke Exposure - Never Smoker  . Smokeless tobacco: Never Used  . Alcohol use Yes     Comment: 02/22/2013 "mixed drink on special occasions; glass of wine q 2-3 months"     Review of Systems:   10 point review of systems was performed and was otherwise negative:  Constitutional: No fever Eyes: No visual disturbances ENT: No sore throat, ear pain Cardiac: No chest pain Respiratory: No shortness of breath, wheezing, or stridor Abdomen: No abdominal pain, no vomiting, No diarrhea Endocrine: No weight loss, No night sweats Extremities: No peripheral edema, cyanosis Skin: No rashes, easy bruising Neurologic: No focal weakness, trouble with speech or swollowing Urologic: No dysuria, Hematuria, or urinary frequency   Physical Exam:  ED Triage Vitals  Enc Vitals Group     BP 01/20/16 1727 (!) 141/80     Pulse Rate 01/20/16 1727 85     Resp 01/20/16 1727 19     Temp 01/20/16 1727 (!) 96.6 F (35.9 C)     Temp Source 01/20/16 1727 Axillary     SpO2 01/20/16 1727 99 %     Weight 01/20/16 1724 173 lb 15.1 oz (78.9 kg)     Height 01/20/16 1724 5\' 3"  (1.6 m)     Head Circumference --      Peak Flow --      Pain Score --      Pain Loc --      Pain Edu? --      Excl. in GC? --     General: Awake , Alert , and Oriented times 3; GCS 15 Head: Normal cephalic , atraumatic Eyes: Pupils equal , round, reactive to light Nose/Throat: No nasal drainage, patent upper airway without erythema or exudate.  Neck: Supple, Full range of motion, No anterior adenopathy or palpable thyroid masses Lungs: Clear to ascultation without wheezes , rhonchi, or rales Heart: Regular rate, regular rhythm without murmurs , gallops , or rubs Abdomen: Soft, non tender without  rebound, guarding , or rigidity; bowel sounds positive and symmetric in all 4 quadrants. No organomegaly .        Extremities: 2 plus symmetric pulses. No edema, clubbing or cyanosis Neurologic: normal ambulation, Motor symmetric without deficits, sensory intact Skin: warm, dry, no rashes   Labs:   All laboratory work was reviewed including any pertinent negatives or positives listed below:  Labs Reviewed  GLUCOSE, CAPILLARY - Abnormal; Notable for the following:       Result Value   Glucose-Capillary 467 (*)    All other components within normal limits  COMPREHENSIVE METABOLIC PANEL - Abnormal; Notable for the following:    Sodium 132 (*)    CO2 14 (*)    Glucose, Bld 370 (*)  Albumin 2.5 (*)    ALT 9 (*)    Total Bilirubin 2.4 (*)    Anion gap 17 (*)    All other components within normal limits  CBC WITH DIFFERENTIAL/PLATELET - Abnormal; Notable for the following:    WBC 11.6 (*)    RDW 14.7 (*)    Neutro Abs 7.1 (*)    Monocytes Absolute 1.1 (*)    All other components within normal limits  URINALYSIS, COMPLETE (UACMP) WITH MICROSCOPIC - Abnormal; Notable for the following:    Color, Urine YELLOW (*)    APPearance CLEAR (*)    Glucose, UA >=500 (*)    Hgb urine dipstick SMALL (*)    Ketones, ur 80 (*)    Protein, ur 30 (*)    Squamous Epithelial / LPF 0-5 (*)    All other components within normal limits  LIPASE, BLOOD - Abnormal; Notable for the following:    Lipase 289 (*)    All other components within normal limits  GLUCOSE, CAPILLARY - Abnormal; Notable for the following:    Glucose-Capillary 379 (*)    All other components within normal limits  GLUCOSE, CAPILLARY - Abnormal; Notable for the following:    Glucose-Capillary 347 (*)    All other components within normal limits  GLUCOSE, CAPILLARY - Abnormal; Notable for the following:    Glucose-Capillary 330 (*)    All other components within normal limits  GLUCOSE, CAPILLARY - Abnormal; Notable for the  following:    Glucose-Capillary 297 (*)    All other components within normal limits  URINE CULTURE  TROPONIN I    EKG:  ED ECG REPORT I, Jennye Moccasin, the attending physician, personally viewed and interpreted this ECG.  Date: 01/20/2016 EKG Time: 1728 Rate: 84 Rhythm: normal sinus rhythm QRS Axis: normal Intervals: normal ST/T Wave abnormalities: normal Conduction Disturbances: none Narrative Interpretation: unremarkable Left ventricular hypertrophy      Radiology: * "Dg Chest 2 View  Result Date: 01/20/2016 CLINICAL DATA:  Hyperglycemia; Pt to ED by EMS after daughter called stating the pt was altered. Pt is diabetic and daughter told EMS that the Pt's CBG was in the 400s yesterday. Upon EMS arrival the's pt's CBG was 527. Patient appears disoriented. EXAM: CHEST  2 VIEW COMPARISON:  08/07/2015 FINDINGS: Heart is mildly enlarged. There are no focal consolidations or pleural effusions. No pulmonary edema. Stable elevation of right hemidiaphragm. Remote anterior fusion of the cervical spine. IMPRESSION: No evidence for acute cardiopulmonary abnormality. Electronically Signed   By: Norva Pavlov M.D.   On: 01/20/2016 18:49  "    I personally reviewed the radiologic studies    ED Course:  Patient was treated for diabetic ketoacidosis with initiation of IV fluids were established on insulin bolus. Patient otherwise appears to be hemodynamically stable. Of note is an elevated lipase on her laboratory work and the patient's had previous cholecystectomy. Pancreatitis may explain the patient's labile blood sugars. She apparently has had an upper endoscopic exam in the recent past which did not show any obvious significant pathology.  Clinical Course      Final Clinical Impression:   Final diagnoses:  Diabetic ketoacidosis without coma associated with type 1 diabetes mellitus (HCC)  Acute pancreatitis, unspecified complication status, unspecified pancreatitis type      Plan: * Inpatient            Jennye Moccasin, MD 01/20/16 2340

## 2016-01-21 LAB — BASIC METABOLIC PANEL
ANION GAP: 7 (ref 5–15)
Anion gap: 18 — ABNORMAL HIGH (ref 5–15)
Anion gap: 8 (ref 5–15)
Anion gap: 8 (ref 5–15)
Anion gap: 6 (ref 5–15)
BUN: 7 mg/dL (ref 6–20)
BUN: 6 mg/dL (ref 6–20)
BUN: 7 mg/dL (ref 6–20)
BUN: 6 mg/dL (ref 6–20)
BUN: 5 mg/dL — ABNORMAL LOW (ref 6–20)
CALCIUM: 9.2 mg/dL (ref 8.9–10.3)
CALCIUM: 9 mg/dL (ref 8.9–10.3)
CALCIUM: 8.5 mg/dL — AB (ref 8.9–10.3)
CHLORIDE: 104 mmol/L (ref 101–111)
CHLORIDE: 110 mmol/L (ref 101–111)
CHLORIDE: 109 mmol/L (ref 101–111)
CHLORIDE: 112 mmol/L — AB (ref 101–111)
CO2: 11 mmol/L — AB (ref 22–32)
CO2: 19 mmol/L — AB (ref 22–32)
CO2: 17 mmol/L — AB (ref 22–32)
CO2: 17 mmol/L — AB (ref 22–32)
CO2: 14 mmol/L — ABNORMAL LOW (ref 22–32)
CREATININE: 0.74 mg/dL (ref 0.44–1.00)
CREATININE: 0.57 mg/dL (ref 0.44–1.00)
CREATININE: 0.57 mg/dL (ref 0.44–1.00)
CREATININE: 0.49 mg/dL (ref 0.44–1.00)
Calcium: 8.8 mg/dL — ABNORMAL LOW (ref 8.9–10.3)
Calcium: 8.5 mg/dL — ABNORMAL LOW (ref 8.9–10.3)
Chloride: 116 mmol/L — ABNORMAL HIGH (ref 101–111)
Creatinine, Ser: 0.52 mg/dL (ref 0.44–1.00)
GFR calc Af Amer: >60 mL/min (ref >60)
GFR calc non Af Amer: >60 mL/min (ref >60)
GFR calc non Af Amer: >60 mL/min (ref >60)
GFR calc non Af Amer: >60 mL/min (ref >60)
GFR calc non Af Amer: >60 mL/min (ref >60)
GLUCOSE: 305 mg/dL — AB (ref 65–99)
GLUCOSE: 120 mg/dL — AB (ref 65–99)
GLUCOSE: 186 mg/dL — AB (ref 65–99)
GLUCOSE: 164 mg/dL — AB (ref 65–99)
Glucose, Bld: 144 mg/dL — ABNORMAL HIGH (ref 65–99)
Potassium: 2 mmol/L — CL (ref 3.5–5.1)
Potassium: 2.6 mmol/L — CL (ref 3.5–5.1)
Potassium: 2.8 mmol/L — ABNORMAL LOW (ref 3.5–5.1)
Potassium: 2.9 mmol/L — ABNORMAL LOW (ref 3.5–5.1)
Potassium: 3.9 mmol/L (ref 3.5–5.1)
SODIUM: 137 mmol/L (ref 135–145)
Sodium: 133 mmol/L — ABNORMAL LOW (ref 135–145)
Sodium: 137 mmol/L (ref 135–145)
Sodium: 134 mmol/L — ABNORMAL LOW (ref 135–145)
Sodium: 135 mmol/L (ref 135–145)

## 2016-01-21 LAB — MAGNESIUM: Magnesium: 1.5 mg/dL — ABNORMAL LOW (ref 1.7–2.4)

## 2016-01-21 LAB — GLUCOSE, CAPILLARY
GLUCOSE-CAPILLARY: 111 mg/dL — AB (ref 65–99)
GLUCOSE-CAPILLARY: 117 mg/dL — AB (ref 65–99)
GLUCOSE-CAPILLARY: 122 mg/dL — AB (ref 65–99)
GLUCOSE-CAPILLARY: 125 mg/dL — AB (ref 65–99)
GLUCOSE-CAPILLARY: 138 mg/dL — AB (ref 65–99)
GLUCOSE-CAPILLARY: 144 mg/dL — AB (ref 65–99)
GLUCOSE-CAPILLARY: 147 mg/dL — AB (ref 65–99)
GLUCOSE-CAPILLARY: 176 mg/dL — AB (ref 65–99)
GLUCOSE-CAPILLARY: 223 mg/dL — AB (ref 65–99)
Glucose-Capillary: 119 mg/dL — ABNORMAL HIGH (ref 65–99)
Glucose-Capillary: 168 mg/dL — ABNORMAL HIGH (ref 65–99)
Glucose-Capillary: 170 mg/dL — ABNORMAL HIGH (ref 65–99)
Glucose-Capillary: 187 mg/dL — ABNORMAL HIGH (ref 65–99)
Glucose-Capillary: 190 mg/dL — ABNORMAL HIGH (ref 65–99)
Glucose-Capillary: 206 mg/dL — ABNORMAL HIGH (ref 65–99)
Glucose-Capillary: 297 mg/dL — ABNORMAL HIGH (ref 65–99)
Glucose-Capillary: 331 mg/dL — ABNORMAL HIGH (ref 65–99)

## 2016-01-21 LAB — LIPASE, BLOOD: LIPASE: 117 U/L — AB (ref 11–51)

## 2016-01-21 LAB — CBC
HEMATOCRIT: 34.2 % — AB (ref 35.0–47.0)
Hemoglobin: 11.6 g/dL — ABNORMAL LOW (ref 12.0–16.0)
MCH: 30.3 pg (ref 26.0–34.0)
MCHC: 34 g/dL (ref 32.0–36.0)
MCV: 89 fL (ref 80.0–100.0)
PLATELETS: 376 10*3/uL (ref 150–440)
RBC: 3.84 MIL/uL (ref 3.80–5.20)
RDW: 14.9 % — AB (ref 11.5–14.5)
WBC: 10.6 10*3/uL (ref 3.6–11.0)

## 2016-01-21 LAB — MRSA PCR SCREENING: MRSA by PCR: NEGATIVE

## 2016-01-21 MED ORDER — INSULIN ASPART 100 UNIT/ML ~~LOC~~ SOLN
0.0000 [IU] | SUBCUTANEOUS | Status: DC
  Administered 2016-01-22: 2 [IU] via SUBCUTANEOUS
  Filled 2016-01-21: qty 2

## 2016-01-21 MED ORDER — TIZANIDINE HCL 4 MG PO TABS
4.0000 mg | ORAL_TABLET | Freq: Two times a day (BID) | ORAL | Status: DC
  Administered 2016-01-21 (×2): 4 mg via ORAL
  Filled 2016-01-21 (×2): qty 1

## 2016-01-21 MED ORDER — POTASSIUM CHLORIDE CRYS ER 20 MEQ PO TBCR
40.0000 meq | EXTENDED_RELEASE_TABLET | ORAL | Status: AC
  Administered 2016-01-21 (×2): 40 meq via ORAL
  Filled 2016-01-21 (×2): qty 2

## 2016-01-21 MED ORDER — INSULIN GLARGINE 100 UNIT/ML ~~LOC~~ SOLN
10.0000 [IU] | Freq: Every day | SUBCUTANEOUS | Status: DC
  Administered 2016-01-21 – 2016-01-24 (×4): 10 [IU] via SUBCUTANEOUS
  Filled 2016-01-21 (×4): qty 0.1

## 2016-01-21 MED ORDER — SODIUM CHLORIDE 0.9 % IV SOLN
30.0000 meq | Freq: Once | INTRAVENOUS | Status: AC
  Administered 2016-01-21: 30 meq via INTRAVENOUS
  Filled 2016-01-21: qty 15

## 2016-01-21 MED ORDER — SODIUM CHLORIDE 0.9 % IV BOLUS (SEPSIS)
1000.0000 mL | Freq: Once | INTRAVENOUS | Status: AC
  Administered 2016-01-21: 1000 mL via INTRAVENOUS

## 2016-01-21 MED ORDER — BUPROPION HCL ER (XL) 150 MG PO TB24
150.0000 mg | ORAL_TABLET | Freq: Two times a day (BID) | ORAL | Status: DC
  Administered 2016-01-21 (×2): 150 mg via ORAL
  Filled 2016-01-21 (×2): qty 1

## 2016-01-21 MED ORDER — INSULIN ASPART 100 UNIT/ML ~~LOC~~ SOLN
0.0000 [IU] | SUBCUTANEOUS | Status: DC
  Administered 2016-01-21: 3 [IU] via SUBCUTANEOUS
  Administered 2016-01-21: 2 [IU] via SUBCUTANEOUS
  Filled 2016-01-21: qty 2
  Filled 2016-01-21: qty 3

## 2016-01-21 MED ORDER — INSULIN ASPART 100 UNIT/ML ~~LOC~~ SOLN: 0.0000 [IU] | Freq: Every day | SUBCUTANEOUS | Status: DC

## 2016-01-21 MED ORDER — CLOPIDOGREL BISULFATE 75 MG PO TABS
75.0000 mg | ORAL_TABLET | Freq: Every day | ORAL | Status: DC
  Administered 2016-01-21 – 2016-01-24 (×4): 75 mg via ORAL
  Filled 2016-01-21 (×4): qty 1

## 2016-01-21 MED ORDER — MAGNESIUM SULFATE 2 GM/50ML IV SOLN
2.0000 g | Freq: Once | INTRAVENOUS | Status: AC
  Administered 2016-01-21: 2 g via INTRAVENOUS
  Filled 2016-01-21: qty 50

## 2016-01-21 MED ORDER — GERHARDT'S BUTT CREAM
TOPICAL_CREAM | Freq: Three times a day (TID) | CUTANEOUS | Status: DC
  Administered 2016-01-21 – 2016-01-24 (×12): via TOPICAL
  Filled 2016-01-21 (×2): qty 1

## 2016-01-21 MED ORDER — FLUMAZENIL 0.5 MG/5ML IV SOLN
0.2000 mg | Freq: Once | INTRAVENOUS | Status: AC
  Administered 2016-01-21: 0.2 mg via INTRAVENOUS
  Filled 2016-01-21: qty 5

## 2016-01-21 MED ORDER — ENOXAPARIN SODIUM 40 MG/0.4ML ~~LOC~~ SOLN
40.0000 mg | SUBCUTANEOUS | Status: DC
  Administered 2016-01-21 – 2016-01-24 (×4): 40 mg via SUBCUTANEOUS
  Filled 2016-01-21 (×4): qty 0.4

## 2016-01-21 MED ORDER — POTASSIUM CHLORIDE 20 MEQ PO PACK
40.0000 meq | PACK | Freq: Once | ORAL | Status: AC
  Administered 2016-01-21: 40 meq via ORAL
  Filled 2016-01-21: qty 2

## 2016-01-21 MED ORDER — SODIUM CHLORIDE 0.9 % IV SOLN
INTRAVENOUS | Status: DC
  Administered 2016-01-21 – 2016-01-22 (×3): via INTRAVENOUS

## 2016-01-21 MED ORDER — METOCLOPRAMIDE HCL 10 MG PO TABS
10.0000 mg | ORAL_TABLET | Freq: Three times a day (TID) | ORAL | Status: DC
  Administered 2016-01-21 – 2016-01-24 (×14): 10 mg via ORAL
  Filled 2016-01-21 (×14): qty 1

## 2016-01-21 MED ORDER — PANTOPRAZOLE SODIUM 40 MG PO TBEC
40.0000 mg | DELAYED_RELEASE_TABLET | Freq: Two times a day (BID) | ORAL | Status: DC
  Administered 2016-01-21 – 2016-01-24 (×9): 40 mg via ORAL
  Filled 2016-01-21 (×9): qty 1

## 2016-01-21 MED ORDER — INSULIN ASPART 100 UNIT/ML ~~LOC~~ SOLN
2.0000 [IU] | Freq: Once | SUBCUTANEOUS | Status: AC
  Administered 2016-01-21: 2 [IU] via SUBCUTANEOUS
  Filled 2016-01-21: qty 2

## 2016-01-21 MED ORDER — POTASSIUM CHLORIDE CRYS ER 20 MEQ PO TBCR
40.0000 meq | EXTENDED_RELEASE_TABLET | Freq: Once | ORAL | Status: AC
  Administered 2016-01-21: 40 meq via ORAL
  Filled 2016-01-21: qty 2

## 2016-01-21 MED ORDER — ATORVASTATIN CALCIUM 20 MG PO TABS
40.0000 mg | ORAL_TABLET | Freq: Every day | ORAL | Status: DC
  Administered 2016-01-21 – 2016-01-24 (×4): 40 mg via ORAL
  Filled 2016-01-21 (×3): qty 2
  Filled 2016-01-21: qty 1
  Filled 2016-01-21: qty 2

## 2016-01-21 NOTE — Progress Notes (Signed)
Pt has been alert since 1200.  No further episodes of decreased LOC or low BP.

## 2016-01-21 NOTE — Progress Notes (Signed)
Wound care nurse in to see pt.Turned and repostioned. Patient sleepier than earlier when taking meds and bed bath. BP low.  Blood sugar 170. Arouses to open eyes and say 'okay" with sternal rub.  Skin dry. Short periods of apnea.  Had zanaflex and welbutrin earlier. NS bolus started. Spoke with daughter on phone. She states they had taken her off all meds recently due to vomitting. She says this episode sounds like what she does at home. ..with or without taking her usual meds.

## 2016-01-21 NOTE — Progress Notes (Signed)
[facility] [place] at [facility]   PATIENT NAME: Sylvia Black    MR#:  [id number]  DATE OF BIRTH:  11/08/1945  SUBJECTIVE:  seen at bedside, admitted for DKA, hypokalemia and altered mental status.  The morning she became more alert than before but after giving Zanaflex, Wellbutrin became more lethargic. But according to the family patient had episodes of lethargy even with or without her medicines.   CHIEF COMPLAINT:   Chief Complaint  Patient presents with  . Hyperglycemia    REVIEW OF SYSTEMS:   Review of Systems  Unable to perform ROS: Acuity of condition     DRUG ALLERGIES:   Allergies  Allergen Reactions  . Ciprofloxacin     Other reaction(s): Hallucinations  . Latex   . Penicillins Hives    Has patient had a PCN reaction causing immediate rash, facial/tongue/throat swelling, SOB or lightheadedness with hypotension: Yes Has patient had a PCN reaction causing severe rash involving mucus membranes or skin necrosis: No Has patient had a PCN reaction that required hospitalization Yes Has patient had a PCN reaction occurring within the last 10 years: No If all of the above answers are "NO", then may proceed with Cephalosporin use.  . Sulfa Antibiotics     "hives"  . Tetanus Toxoids     "I had a strong reaction".    VITALS:  Blood pressure (!) 76/46, pulse 60, temperature 97.5 F (36.4 C), temperature source Oral, resp. rate 15, height 5\' 3"  (1.6 m), weight 79.5 kg (175 lb 4.3 oz), SpO2 100 %.  PHYSICAL EXAMINATION:  GENERAL:  70 y.o.-year-old patient lying in the bed with no acute distress.  EYES: Pupils equal, round, reactive to light and accommodation. No scleral icterus. Extraocular muscles intact.  HEENT: Head atraumatic, normocephalic. Oropharynx and nasopharynx clear.  NECK:  Supple, no jugular venous distention. No thyroid enlargement, no tenderness.  LUNGS: Normal breath sounds bilaterally, no wheezing, rales,rhonchi or  crepitation. No use of accessory muscles of respiration.  CARDIOVASCULAR: S1, S2 normal. No murmurs, rubs, or gallops.  ABDOMEN: Soft, nontender, nondistended. Bowel sounds present. No organomegaly or mass.  EXTREMITIES: No pedal edema, cyanosis, or clubbing.  NEUROLOGIC: Cranial nerves II through XII are intact. Muscle strength 5/5 in all extremities. Sensation intact. Gait not checked.  PSYCHIATRIC: The patient is alert and oriented x 3.  SKIN: Patient has lot of incontinence associated dermatitis and fungal overgrowth.   LABORATORY PANEL:   CBC  Recent Labs Lab 01/21/16 0104  WBC 10.6  HGB 11.6*  HCT 34.2*  PLT 376   ------------------------------------------------------------------------------------------------------------------  Chemistries   Recent Labs Lab 01/20/16 1913  01/21/16 0534 01/21/16 0845  NA 132*  < > 137 134*  K 3.5  < > 2.6* 2.8*  CL 101  < > 110 109  CO2 14*  < > 19* 17*  GLUCOSE 370*  < > 120* 186*  BUN 7  < > 6 7  CREATININE 0.88  < > 0.52 0.57  CALCIUM 9.5  < > 9.0 8.8*  MG  --   --  1.5*  --   AST 20  --   --   --   ALT 9*  --   --   --   ALKPHOS 80  --   --   --   BILITOT 2.4*  --   --   --   < > = values in this interval not displayed. ------------------------------------------------------------------------------------------------------------------  Cardiac Enzymes  Recent Labs Lab  01/20/16 1913  TROPONINI <0.03   ------------------------------------------------------------------------------------------------------------------  RADIOLOGY:  Dg Chest 2 View  Result Date: 01/20/2016 CLINICAL DATA:  Hyperglycemia; Pt to ED by EMS after daughter called stating the pt was altered. Pt is diabetic and daughter told EMS that the Pt's CBG was in the 400s yesterday. Upon EMS arrival the's pt's CBG was 527. Patient appears disoriented. EXAM: CHEST  2 VIEW COMPARISON:  08/07/2015 FINDINGS: Heart is mildly enlarged. There are no focal  consolidations or pleural effusions. No pulmonary edema. Stable elevation of right hemidiaphragm. Remote anterior fusion of the cervical spine. IMPRESSION: No evidence for acute cardiopulmonary abnormality. Electronically Signed   By: Norva PavlovElizabeth  Brown M.D.   On: 01/20/2016 18:49    EKG:   Orders placed or performed during the hospital encounter of 01/20/16  . EKG 12-Lead  . EKG 12-Lead    ASSESSMENT AND PLAN:   Altered mental status secondary to DKA: Received a insulin drip, anion gap is closed. Converted from insulin drip to Lantus but patient is lethargic and has poor by mouth intake And may possibly have to restart insulin drip, D5 in IV fluids if patient continues to have poor by mouth intake.  #2 DKA: Resolved, still has metabolic acidosis with bicarbonate of 17, continue with aggressive hydration, started on small dose Levemir, continue SSI with coverage. Resume insulin drip. Patient continues to be lethargic.  #3 severe hypokalemia, hypomagnesemia: Pharmacy consulted for  Electrolyte management.  #4 hypotension; due to hypovolemic shock: Aggressive hydration  #5 altered mental status, history of previous stroke: Likely due to combination of DKA, metabolic acidosis, Patient's the family says that patient remains lethargic even without medicines. Obtain MRI/CT of the head once the blood pressure improves.  #6 elevated lipase, check lipase today, if needed get a CT of abdomen. 7 /history of previous stroke: Continue Plavix, statins; poor performance status at baseline.  #8 diaper dermatitis: Seen by wound care nurse, started on Diflucan, continue diaper rash cream,      All the records are reviewed and case discussed with Care Management/Social Workerr. Management plans discussed with the patient, family and they are in agreement.  CODE STATUS:full  TOTAL TIME (Critical care time)TAKING CARE OF THIS PATIENT:40 minutes.   POSSIBLE D/C IN 3-4 DAYS, DEPENDING ON CLINICAL  CONDITION.   Katha HammingKONIDENA,Broadus Costilla M.D on 01/21/2016 at 1:17 PM  Between 7am to 6pm - Pager - [phone]  After 6pm go to [email or web address] - password [email or web address] [facility]  [facility]  Office  [phone]  CC: Primary care physician; Emogene MorganAYCOCK, NGWE A, MD   Note: This dictation was prepared with Dragon dictation along with smaller phrase technology. Any transcriptional errors that result from this process are unintentional.

## 2016-01-21 NOTE — Consult Note (Signed)
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  Intertriginous dermatitis in the subpannicular skin fold. Patient is from home where her urinary incontinence is managed with adult disposable briefs. Frequency of changes may need to be more often. Sacral area is intact as as both heels.  Both are at risk for skin breakdown.  A sacral prophylactic dressing is in place, I will add bilateral pressure redistribution heel boots for the heels. Wound type:Moisture associated skin damage, specifically incontinence associated dermatitis Pressure Ulcer POA:No Measurement: Entire perineum, bilateral buttocks and bilateral medial thighs with erythema, also pinpoint scattered areas of partial thickness skin loss. Distinct satellite lesions are noted in the medial thigh area that are consistent with fungal overgrowth and commonly seen with IAD. Wound bed:As described above Drainage (amount, consistency, odor) Scant serous Periwound: Satellite lesions in the medial thighs. Dressing procedure/placement/frequency: I will add a topical application of Dr. Wilford SportsGerhart's butt cream TID (antifungal, skin barrier (Z.O.) and steroid) to address the immediate symptoms, patient has also recently had an indwelling urinary catheter inserted. I would also suggest a one-time dose of systemic antifungal (ie, Diflucan).  If you agree, please order. Nursing is provided guidance for turning and repositioning via the Orders. Our house antimicrobial wicking textile (InterDry Ag+) for placement in the intertriginous area of the panus is provided. WOC nursing team will not follow, but will remain available to this patient, the nursing and medical teams.  Please re-consult if needed. Thanks, Ladona MowLaurie Sender Rueb, MSN, RN, GNP, Hans EdenCWOCN, CWON-AP, FAAN  Pager# [phone]

## 2016-01-21 NOTE — Progress Notes (Addendum)
MEDICATION RELATED CONSULT NOTE - INITIAL   Pharmacy Consult for electrolyte management  Allergies  Allergen Reactions  . Ciprofloxacin     Other reaction(s): Hallucinations  . Latex   . Penicillins Hives    Has patient had a PCN reaction causing immediate rash, facial/tongue/throat swelling, SOB or lightheadedness with hypotension: Yes Has patient had a PCN reaction causing severe rash involving mucus membranes or skin necrosis: No Has patient had a PCN reaction that required hospitalization Yes Has patient had a PCN reaction occurring within the last 10 years: No If all of the above answers are "NO", then may proceed with Cephalosporin use.  . Sulfa Antibiotics     "hives"  . Tetanus Toxoids     "I had a strong reaction".    Patient Measurements: Height: 5\' 3"  (160 cm) Weight: 175 lb 4.3 oz (79.5 kg) IBW/kg (Calculated) : 52.4  Vital Signs: Temp: 97.5 F (36.4 C) (12/31 0730) Temp Source: Oral (12/31 0730) BP: 76/46 (12/31 1125) Pulse Rate: 60 (12/31 1125) Intake/Output from previous day: 12/30 0701 - 12/31 0700 In: 1277 [I.V.:777; IV Piggyback:500] Out: 575 [Urine:575] Intake/Output from this shift: Total I/O In: 151.4 [I.V.:101.4; IV Piggyback:50] Out: 80 [Urine:80]  Labs:  Recent Labs  01/20/16 1913 01/21/16 0104 01/21/16 0534 01/21/16 0845  WBC 11.6* 10.6  --   --   HGB 12.4 11.6*  --   --   HCT 37.4 34.2*  --   --   PLT 425 376  --   --   CREATININE 0.88 0.74 0.52 0.57  MG  --   --  1.5*  --   ALBUMIN 2.5*  --   --   --   PROT 6.9  --   --   --   AST 20  --   --   --   ALT 9*  --   --   --   ALKPHOS 80  --   --   --   BILITOT 2.4*  --   --   --    Estimated Creatinine Clearance: 67.2 mL/min (by C-G formula based on SCr of 0.57 mg/dL).   Microbiology: Recent Results (from the past 720 hour(s))  MRSA PCR Screening     Status: None   Collection Time: 01/21/16 12:10 AM  Result Value Ref Range Status   MRSA by PCR NEGATIVE NEGATIVE Final   Comment:        The GeneXpert MRSA Assay (FDA approved for NASAL specimens only), is one component of a comprehensive MRSA colonization surveillance program. It is not intended to diagnose MRSA infection nor to guide or monitor treatment for MRSA infections.     Medical History: Past Medical History:  Diagnosis Date  . Anxiety   . Bipolar disorder (HCC)   . Bullous pemphigoid   . 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    "left; see pain dr for that" (02/22/2013)  . Depression   . Family history of anesthesia complication    "daughter's w/PONV" (02/22/2013)  . Frequent UTI   . GERD (gastroesophageal reflux disease)   . Gout attack    "twice" (02/22/2013)  . High cholesterol   . Hypertension   . Pneumonia 1990's   "once"  . PONV (postoperative nausea and vomiting)    "makes me violently ill" (02/22/2013)  . Sleep apnea    "dx'd in the 1990's; don't wear mask" (02/22/2013)  . Stroke [facility](HCC) 2010; 02/2012   denies residual; "balance issues since & weak on left side  since" (02/22/2013)  . Type II diabetes mellitus (HCC)   . Vertigo     Assessment: 70 yo female with DKA and hypokalemia. Pharmacy consulted for electrolyte management. K+  At 0100 this morning 2.0. Patient had received PO and IV replacement, but still has hypokalemia.   Goal of Therapy:  K: 3.5-5 Mg: >1.8  Plan:  12/31 @0845   K: 2.8  Mg: 1.5 Mg replaced with Mag Sulfate IV 2gm X1  K+ replaced with KCL 40mEq PO. Will order another 40 mEq and recheck K+ at 1800.   12/31 1248 K+: 2.9 - KCL 40mEq given after lab draw. Will order additional 40 KCL mEq to be given now. Will recheck labs in 6 hours.   12/31 PM K+ 3.9. No further supplementation ordered. BMP in AM.  Gardner CandleSheema M Hallaji, PharmD, BCPS Clinical Pharmacist 01/21/2016 12:21 PM

## 2016-01-22 ENCOUNTER — Inpatient Hospital Stay: Payer: Medicare HMO

## 2016-01-22 LAB — GLUCOSE, CAPILLARY
GLUCOSE-CAPILLARY: 126 mg/dL — AB (ref 65–99)
GLUCOSE-CAPILLARY: 157 mg/dL — AB (ref 65–99)
GLUCOSE-CAPILLARY: 100 mg/dL — AB (ref 65–99)
Glucose-Capillary: 116 mg/dL — ABNORMAL HIGH (ref 65–99)

## 2016-01-22 LAB — URINE CULTURE: CULTURE: NO GROWTH

## 2016-01-22 LAB — BASIC METABOLIC PANEL
ANION GAP: 8 (ref 5–15)
BUN: <5 mg/dL — ABNORMAL LOW (ref 6–20)
CHLORIDE: 115 mmol/L — AB (ref 101–111)
CO2: 14 mmol/L — ABNORMAL LOW (ref 22–32)
CREATININE: 0.52 mg/dL (ref 0.44–1.00)
Calcium: 8.6 mg/dL — ABNORMAL LOW (ref 8.9–10.3)
GFR calc non Af Amer: >60 mL/min (ref >60)
Glucose, Bld: 115 mg/dL — ABNORMAL HIGH (ref 65–99)
POTASSIUM: 3.5 mmol/L (ref 3.5–5.1)
SODIUM: 137 mmol/L (ref 135–145)

## 2016-01-22 LAB — LIPASE, BLOOD: LIPASE: 97 U/L — AB (ref 11–51)

## 2016-01-22 MED ORDER — SODIUM BICARBONATE 650 MG PO TABS
650.0000 mg | ORAL_TABLET | Freq: Two times a day (BID) | ORAL | Status: AC
Start: 2016-01-22 — End: 2016-01-22
  Administered 2016-01-22 (×2): 650 mg via ORAL
  Filled 2016-01-22 (×2): qty 1

## 2016-01-22 MED ORDER — IOPAMIDOL (ISOVUE-300) INJECTION 61%
15.0000 mL | INTRAVENOUS | Status: AC
  Administered 2016-01-22 (×2): 15 mL via ORAL

## 2016-01-22 MED ORDER — ACETAMINOPHEN 325 MG PO TABS: 650.0000 mg | ORAL_TABLET | Freq: Four times a day (QID) | ORAL | Status: DC | PRN

## 2016-01-22 MED ORDER — IOPAMIDOL (ISOVUE-300) INJECTION 61%
100.0000 mL | Freq: Once | INTRAVENOUS | Status: AC | PRN
  Administered 2016-01-22: 100 mL via INTRAVENOUS

## 2016-01-22 MED ORDER — INSULIN ASPART 100 UNIT/ML ~~LOC~~ SOLN
0.0000 [IU] | Freq: Every day | SUBCUTANEOUS | Status: DC
  Administered 2016-01-23 – 2016-01-24 (×2): 2 [IU] via SUBCUTANEOUS
  Filled 2016-01-22 (×2): qty 2

## 2016-01-22 MED ORDER — INSULIN ASPART 100 UNIT/ML ~~LOC~~ SOLN
0.0000 [IU] | Freq: Three times a day (TID) | SUBCUTANEOUS | Status: DC
Start: 2016-01-22 — End: 2016-01-25
  Administered 2016-01-22: 1 [IU] via SUBCUTANEOUS
  Administered 2016-01-22: 2 [IU] via SUBCUTANEOUS
  Administered 2016-01-23: 3 [IU] via SUBCUTANEOUS
  Administered 2016-01-23: 2 [IU] via SUBCUTANEOUS
  Administered 2016-01-23 – 2016-01-24 (×4): 3 [IU] via SUBCUTANEOUS
  Filled 2016-01-22: qty 3
  Filled 2016-01-22: qty 2
  Filled 2016-01-22 (×3): qty 3
  Filled 2016-01-22: qty 1
  Filled 2016-01-22: qty 3
  Filled 2016-01-22: qty 2

## 2016-01-22 NOTE — Evaluation (Signed)
Physical Therapy Evaluation Patient Details Name: Sylvia Black MRN: [id number] DOB: 14-Aug-1945 Today's Date: 01/22/2016   History of Present Illness  Pt is a 71 y.o. female presenting to hospital with AMS and hyperglycemia.  Pt admitted for diabetic ketoacidosis without coma, hypokalemia, and AMS.  PMH includes h/o stroke (with residual L sided weakness), anxiety, bipolar disorder, chronic L shoulder pain, depression, frequent UTI, htn, DM, and vertigo.  Clinical Impression  Prior to hospital admission, pt required some assist for logrolling in hospital bed at home (pt wore diapers and was cleaned-up in bed; had sponge baths in bed); otherwise pt was hoyer lift bed to manual w/c (has not stood since July of 2017).  Pt lives with her daughter and others in 1 level home with ramped entrance.  Currently pt is max assist with logrolling in bed for clean-up; pt able to hold onto bedrails to stay in position once assisted into sidelying position but was total assist for clean-up.  Impaired skin integrity noted during clean-up (in areas typically covered with diaper) and NT notified nursing and nurse came to assess during session.  Pt would benefit from skilled PT to address noted impairments and functional limitations during hospital stay (to decrease assist levels with bed mobility).  Recommend pt discharge to home with 24/7 assist and HHPT (to improve strength and decrease assist levels with bed mobility).     Follow Up Recommendations Home health PT;Supervision/Assistance - 24 hour    Equipment Recommendations  Other (comment) (Pt has all required DME at home already)    Recommendations for Other Services       Precautions / Restrictions Precautions Precautions: Fall Precaution Comments: Prevalon boots Restrictions Weight Bearing Restrictions: No      Mobility  Bed Mobility Overal bed mobility: Needs Assistance Bed Mobility: Rolling Rolling: Max assist         General bed mobility  comments: max assist for logrolling to R and L for clean-up in bed; vc's and assist required to have pt reach for siderails to assist (pt able to hold on once positioned)  Transfers                 General transfer comment: Deferred d/t pt has not been able to stand since July  Ambulation/Gait                Stairs            Wheelchair Mobility    Modified Rankin (Stroke Patients Only)       Balance                                             Pertinent Vitals/Pain Pain Assessment: Faces Faces Pain Scale: Hurts little more Pain Location: B hands with use/activity Pain Descriptors / Indicators: Sore Pain Intervention(s): Limited activity within patient's tolerance;Monitored during session;Repositioned    Home Living Family/patient expects to be discharged to:: Private residence Living Arrangements: Children (Pt's daughter, grandson, and grandson's best friend) Available Help at Discharge: Family;Available 24 hours/day Type of Home: House Home Access: Ramped entrance     Home Layout: One level Home Equipment: Wheelchair - manual;Hospital bed (hoyer; trapeze over bed)      Prior Function Level of Independence: Needs assistance   Gait / Transfers Assistance Needed: daughter uses Hoyer lift to transfer pt to [facility]  ADL's / Homemaking Assistance Needed:  Daughter assists with all ADLs (pt wears diaper and gets bath in bed)  Comments: Pt was bed bound prior to hospital admission (pt's daughter reports pt has not been able to stand since July).  Pt's daughter pushes pt in manual w/c.  Has not had HHPT since September.     Hand Dominance        Extremity/Trunk Assessment   Upper Extremity Assessment Upper Extremity Assessment: RUE deficits/detail;LUE deficits/detail (L UE weaker than R UE in general with functional tasks (not formally assessed))    Lower Extremity Assessment Lower Extremity Assessment: RLE deficits/detail;LLE  deficits/detail RLE Deficits / Details: at least 2+/5 hip flexion, knee flexion/extension, and DF LLE Deficits / Details: at least 2/5 hip flexion, knee flexion/extension, and DF       Communication   Communication: No difficulties  Cognition Arousal/Alertness: Awake/alert Behavior During Therapy: WFL for tasks assessed/performed Overall Cognitive Status:  (Oriented to name but not year of birth)                      General Comments General comments (skin integrity, edema, etc.): Pt incontinent of bowel beginning of session requiring clean-up.  Nursing cleared pt for participation in physical therapy.  Pt and pt's daughter agreeable to PT session.    Exercises     Assessment/Plan    PT Assessment Patient needs continued PT services  PT Problem List Decreased strength;Decreased mobility;Decreased skin integrity          PT Treatment Interventions Functional mobility training;Therapeutic activities;Therapeutic exercise;Patient/family education;DME instruction    PT Goals (Current goals can be found in the Care Plan section)  Acute Rehab PT Goals Patient Stated Goal: to be able to assist more with bed mobility (to make hygiene/clean-up easier for caregiver) PT Goal Formulation: With patient/family Time For Goal Achievement: 02/05/16 Potential to Achieve Goals: Good    Frequency Min 2X/week   Barriers to discharge        Co-evaluation               End of Session   Activity Tolerance: Patient limited by fatigue Patient left: in bed;with call bell/phone within reach;with nursing/sitter in room (NT present with pt finishing clean-up end of session) Nurse Communication: Mobility status;Precautions (Need for prevalon boots (pt order))         Time: [id number] PT Time Calculation (min) (ACUTE ONLY): 30 min   Charges:   PT Evaluation $PT Eval Low Complexity: 1 Procedure PT Treatments $Therapeutic Activity: 8-22 mins   PT G CodesHendricks Limes:        Bianka Liberati 01/22/2016, 4:02 PM Hendricks LimesEmily Diarra Ceja, PT [phone]

## 2016-01-22 NOTE — Plan of Care (Signed)
Problem: Activity: Goal: Risk for activity intolerance will decrease Outcome: Not Progressing Waiting for PT to assess pt  Problem: Nutrition: Goal: Adequate nutrition will be maintained Outcome: Progressing Taking some po intake

## 2016-01-22 NOTE — Progress Notes (Signed)
MEDICATION RELATED CONSULT NOTE - INITIAL   Pharmacy Consult for electrolyte management  Allergies  Allergen Reactions  . Ciprofloxacin     Other reaction(s): Hallucinations  . Latex   . Penicillins Hives    Has patient had a PCN reaction causing immediate rash, facial/tongue/throat swelling, SOB or lightheadedness with hypotension: Yes Has patient had a PCN reaction causing severe rash involving mucus membranes or skin necrosis: No Has patient had a PCN reaction that required hospitalization Yes Has patient had a PCN reaction occurring within the last 10 years: No If all of the above answers are "NO", then may proceed with Cephalosporin use.  . Sulfa Antibiotics     "hives"  . Tetanus Toxoids     "I had a strong reaction".    Patient Measurements: Height: 5\' 3"  (160 cm) Weight: 175 lb 4.3 oz (79.5 kg) IBW/kg (Calculated) : 52.4  Vital Signs: Temp: 98.1 F (36.7 C) (01/01 0408) Temp Source: Oral (01/01 0408) BP: 152/56 (01/01 0408) Pulse Rate: 89 (01/01 0408) Intake/Output from previous day: 12/31 0701 - 01/01 0700 In: 1739.7 [P.O.:600; I.V.:1089.7; IV Piggyback:50] Out: 880 [Urine:880] Intake/Output from this shift: No intake/output data recorded.  Labs:  Recent Labs  01/20/16 1913 01/21/16 0104 01/21/16 0534  01/21/16 1248 01/21/16 2129 01/22/16 0435  WBC 11.6* 10.6  --   --   --   --   --   HGB 12.4 11.6*  --   --   --   --   --   HCT 37.4 34.2*  --   --   --   --   --   PLT 425 376  --   --   --   --   --   CREATININE 0.88 0.74 0.52  < > 0.57 0.49 0.52  MG  --   --  1.5*  --   --   --   --   ALBUMIN 2.5*  --   --   --   --   --   --   PROT 6.9  --   --   --   --   --   --   AST 20  --   --   --   --   --   --   ALT 9*  --   --   --   --   --   --   ALKPHOS 80  --   --   --   --   --   --   BILITOT 2.4*  --   --   --   --   --   --   < > = values in this interval not displayed. Estimated Creatinine Clearance: 67.2 mL/min (by C-G formula based on SCr of  0.52 mg/dL).  Lab Results  Component Value Date   K 3.5 01/22/2016    Assessment: 71 yo female with DKA and hypokalemia. Patient is now off of insulin drip.   K = 3.5 WNL this morning  Goal of Therapy:  K: 3.5-5 Mg: >1.8  Plan:  No supplementation needed at this time.  Will recheck K and Mg with AM labs tomorrow. If WNL, recommend signing off of consult since insulin drip no longer active.   Cindi CarbonMary M Jahleah Mariscal, PharmD, BCPS Clinical Pharmacist 01/22/2016 7:27 AM

## 2016-01-22 NOTE — Progress Notes (Signed)
Pt transferred to room 152 on monitor. Daughter Crystal was notified.

## 2016-01-22 NOTE — Progress Notes (Signed)
[facility] - [place] at [facility]   PATIENT NAME: Sylvia Black    MR#:  [id number]  DATE OF BIRTH:  08/29/1945  SUBJECTIVE:  Patient is alert, awake ,oriented. Admitted because of altered mental status, DKA, hypokalemia.    CHIEF COMPLAINT:   Chief Complaint  Patient presents with  . Hyperglycemia    REVIEW OF SYSTEMS:   Review of Systems  Constitutional: Negative for chills and fever.  HENT: Negative for hearing loss.   Eyes: Negative for blurred vision, double vision and photophobia.  Respiratory: Negative for cough, hemoptysis and shortness of breath.   Cardiovascular: Negative for palpitations, orthopnea and leg swelling.  Gastrointestinal: Negative for abdominal pain, diarrhea and vomiting.  Genitourinary: Negative for dysuria and urgency.  Musculoskeletal: Positive for falls, joint pain and myalgias. Negative for neck pain.  Skin: Negative for rash.  Neurological: Negative for dizziness, focal weakness, seizures, weakness and headaches.  Psychiatric/Behavioral: Positive for memory loss. The patient does not have insomnia.      DRUG ALLERGIES:   Allergies  Allergen Reactions  . Ciprofloxacin     Other reaction(s): Hallucinations  . Latex   . Penicillins Hives    Has patient had a PCN reaction causing immediate rash, facial/tongue/throat swelling, SOB or lightheadedness with hypotension: Yes Has patient had a PCN reaction causing severe rash involving mucus membranes or skin necrosis: No Has patient had a PCN reaction that required hospitalization Yes Has patient had a PCN reaction occurring within the last 10 years: No If all of the above answers are "NO", then may proceed with Cephalosporin use.  . Sulfa Antibiotics     "hives"  . Tetanus Toxoids     "I had a strong reaction".    VITALS:  Blood pressure (!) 168/56, pulse 95, temperature 98.1 F (36.7 C), temperature source Oral, resp. rate 18, height 5\' 3"  (1.6 m), weight 79.5 kg  (175 lb 4.3 oz), SpO2 100 %.  PHYSICAL EXAMINATION:  GENERAL:  71 y.o.-year-old patient lying in the bed with no acute distress.  EYES: Pupils equal, round, reactive to light and accommodation. No scleral icterus. Extraocular muscles intact.  HEENT: Head atraumatic, normocephalic. Oropharynx and nasopharynx clear.  NECK:  Supple, no jugular venous distention. No thyroid enlargement, no tenderness.  LUNGS: Normal breath sounds bilaterally, no wheezing, rales,rhonchi or crepitation. No use of accessory muscles of respiration.  CARDIOVASCULAR: S1, S2 normal. No murmurs, rubs, or gallops.  ABDOMEN: Soft, nontender, nondistended. Bowel sounds present. No organomegaly or mass.  EXTREMITIES:bilateral leg mild red erythema, but no edema NEUROLOGIC: Cranial nerves II through XII are intact. Strength decreased in both legs  And arms.. Sensation intact. Gait not checked.  PSYCHIATRIC: The patient is alert and oriented x 3.  SKIN: Patient has lot of incontinence associated dermatitis and fungal overgrowth.   LABORATORY PANEL:   CBC  Recent Labs Lab 01/21/16 0104  WBC 10.6  HGB 11.6*  HCT 34.2*  PLT 376   ------------------------------------------------------------------------------------------------------------------  Chemistries   Recent Labs Lab 01/20/16 1913  01/21/16 0534  01/22/16 0435  NA 132*  < > 137  < > 137  K 3.5  < > 2.6*  < > 3.5  CL 101  < > 110  < > 115*  CO2 14*  < > 19*  < > 14*  GLUCOSE 370*  < > 120*  < > 115*  BUN 7  < > 6  < > <5*  CREATININE 0.88  < > 0.52  < >  0.52  CALCIUM 9.5  < > 9.0  < > 8.6*  MG  --   --  1.5*  --   --   AST 20  --   --   --   --   ALT 9*  --   --   --   --   ALKPHOS 80  --   --   --   --   BILITOT 2.4*  --   --   --   --   < > = values in this interval not displayed. ------------------------------------------------------------------------------------------------------------------  Cardiac Enzymes  Recent Labs Lab 01/20/16 1913   TROPONINI <0.03   ------------------------------------------------------------------------------------------------------------------  RADIOLOGY:  Dg Chest 2 View  Result Date: 01/20/2016 CLINICAL DATA:  Hyperglycemia; Pt to ED by EMS after daughter called stating the pt was altered. Pt is diabetic and daughter told EMS that the Pt's CBG was in the 400s yesterday. Upon EMS arrival the's pt's CBG was 527. Patient appears disoriented. EXAM: CHEST  2 VIEW COMPARISON:  08/07/2015 FINDINGS: Heart is mildly enlarged. There are no focal consolidations or pleural effusions. No pulmonary edema. Stable elevation of right hemidiaphragm. Remote anterior fusion of the cervical spine. IMPRESSION: No evidence for acute cardiopulmonary abnormality. Electronically Signed   By: Norva Pavlov M.D.   On: 01/20/2016 18:49    EKG:   Orders placed or performed during the hospital encounter of 01/20/16  . EKG 12-Lead  . EKG 12-Lead    ASSESSMENT AND PLAN:   Altered mental status secondary to DKA: Received a insulin drip, anion gap is closed. Converted from insulin drip to Lantus ,SSI.  #2 DKA: Resolved, still has metabolic acidosis ;Continue aggressive hydration.  #3 severe hypokalemia, hypomagnesemia: Pharmacy consulted for  Electrolyte management.  #4 hypotension; due to hypovolemic shock: Improved.  #5 altered mental status, history of previous stroke: Likely due to combination of DKA, metabolic acidosis. Mental status improved, stopped  Wellbutrin and flexeril for today   #6 elevated lipase, ;ct abdomen today  With contrast   7 /history of previous stroke: Continue Plavix, statins; poor performance status at baseline. PT consult  #8 diaper dermatitis: Seen by wound care nurse, started on Diflucan, continue diaper rash cream,foley to help with healing,      All the records are reviewed and case discussed with Care Management/Social Workerr. Management plans discussed with the patient,  family and they are in agreement.  CODE STATUS:full  TOTAL TIME (Critical care time)TAKING CARE OF THIS PATIENT:40 minutes.   POSSIBLE D/C IN 3-4 DAYS, DEPENDING ON CLINICAL CONDITION.   Katha Hamming M.D on 01/22/2016 at 10:24 AM  Between 7am to 6pm - Pager - [phone]  After 6pm go to [email or web address] - password [email or web address] [facility]  [place]  [facility]  Office  [phone]  CC: Primary care physician; Emogene Morgan, MD   Note: This dictation was prepared with Dragon dictation along with smaller phrase technology. Any transcriptional errors that result from this process are unintentional.

## 2016-01-23 LAB — GLUCOSE, CAPILLARY
GLUCOSE-CAPILLARY: 152 mg/dL — AB (ref 65–99)
GLUCOSE-CAPILLARY: 239 mg/dL — AB (ref 65–99)
GLUCOSE-CAPILLARY: 216 mg/dL — AB (ref 65–99)
GLUCOSE-CAPILLARY: 223 mg/dL — AB (ref 65–99)

## 2016-01-23 LAB — COMPREHENSIVE METABOLIC PANEL
ALT: 8 U/L — AB (ref 14–54)
ANION GAP: 8 (ref 5–15)
AST: 12 U/L — ABNORMAL LOW (ref 15–41)
Albumin: 1.9 g/dL — ABNORMAL LOW (ref 3.5–5.0)
Alkaline Phosphatase: 82 U/L (ref 38–126)
BUN: <5 mg/dL — ABNORMAL LOW (ref 6–20)
CHLORIDE: 109 mmol/L (ref 101–111)
CO2: 17 mmol/L — AB (ref 22–32)
CREATININE: 0.46 mg/dL (ref 0.44–1.00)
Calcium: 8.2 mg/dL — ABNORMAL LOW (ref 8.9–10.3)
Glucose, Bld: 265 mg/dL — ABNORMAL HIGH (ref 65–99)
POTASSIUM: 3.4 mmol/L — AB (ref 3.5–5.1)
SODIUM: 134 mmol/L — AB (ref 135–145)
Total Bilirubin: 0.7 mg/dL (ref 0.3–1.2)
Total Protein: 5.6 g/dL — ABNORMAL LOW (ref 6.5–8.1)

## 2016-01-23 LAB — LIPASE, BLOOD: Lipase: 77 U/L — ABNORMAL HIGH (ref 11–51)

## 2016-01-23 LAB — MAGNESIUM: MAGNESIUM: 1.6 mg/dL — AB (ref 1.7–2.4)

## 2016-01-23 LAB — POTASSIUM
POTASSIUM: 2.6 mmol/L — AB (ref 3.5–5.1)
POTASSIUM: 3.7 mmol/L (ref 3.5–5.1)

## 2016-01-23 MED ORDER — ONDANSETRON HCL 4 MG/2ML IJ SOLN
4.0000 mg | Freq: Four times a day (QID) | INTRAMUSCULAR | Status: DC | PRN
  Administered 2016-01-23: 4 mg via INTRAVENOUS
  Filled 2016-01-23: qty 2

## 2016-01-23 MED ORDER — MEROPENEM-SODIUM CHLORIDE 1 GM/50ML IV SOLR
1.0000 g | Freq: Three times a day (TID) | INTRAVENOUS | Status: DC
  Administered 2016-01-23 – 2016-01-24 (×3): 1 g via INTRAVENOUS
  Filled 2016-01-23 (×6): qty 50

## 2016-01-23 MED ORDER — PREDNISONE 1 MG PO TABS
5.0000 mg | ORAL_TABLET | Freq: Every day | ORAL | Status: DC
  Administered 2016-01-24: 5 mg via ORAL
  Filled 2016-01-23: qty 5

## 2016-01-23 MED ORDER — MAGNESIUM SULFATE 2 GM/50ML IV SOLN
2.0000 g | Freq: Once | INTRAVENOUS | Status: AC
  Administered 2016-01-23: 2 g via INTRAVENOUS
  Filled 2016-01-23: qty 50

## 2016-01-23 MED ORDER — POTASSIUM CHLORIDE CRYS ER 20 MEQ PO TBCR
40.0000 meq | EXTENDED_RELEASE_TABLET | ORAL | Status: AC
  Administered 2016-01-23 (×3): 40 meq via ORAL
  Filled 2016-01-23 (×3): qty 2

## 2016-01-23 MED ORDER — SODIUM BICARBONATE 650 MG PO TABS
650.0000 mg | ORAL_TABLET | Freq: Every day | ORAL | Status: DC
  Administered 2016-01-23 – 2016-01-24 (×2): 650 mg via ORAL
  Filled 2016-01-23 (×2): qty 1

## 2016-01-23 MED ORDER — METRONIDAZOLE IN NACL 5-0.79 MG/ML-% IV SOLN
500.0000 mg | Freq: Three times a day (TID) | INTRAVENOUS | Status: DC
  Administered 2016-01-23: 500 mg via INTRAVENOUS
  Filled 2016-01-23 (×3): qty 100

## 2016-01-23 MED ORDER — SODIUM CHLORIDE 0.9 % IV SOLN
1.0000 g | Freq: Three times a day (TID) | INTRAVENOUS | Status: DC
  Filled 2016-01-23: qty 1

## 2016-01-23 NOTE — Progress Notes (Signed)
CH rounding the unit was asked by the Nurse to visit the Pt. Pt asleep, CH was not able to talk to Pt, but had a briefly conversation with the Nursing Aid in the Rm with Pt.    01/23/16 1600  Clinical Encounter Type  Visited With Patient  Visit Type Initial;Spiritual support  Referral From Nurse  Consult/Referral To Chaplain  Spiritual Encounters  Spiritual Needs Prayer;Other (Comment)

## 2016-01-23 NOTE — Progress Notes (Signed)
[facility] [place] [facility]   PATIENT NAME: Sylvia Black    MR#:  [id number]  DATE OF BIRTH:  02/06/1945  SUBJECTIVE:  Some nausea this morning but able to tolerate her breakfast. No abdominal pain. Patient is afebrile. Blood sugars are less than 200. Abdominal CAT scan showed colitis.   CHIEF COMPLAINT:   Chief Complaint  Patient presents with  . Hyperglycemia    REVIEW OF SYSTEMS:   Review of Systems  Constitutional: Negative for chills and fever.  HENT: Negative for hearing loss.   Eyes: Negative for blurred vision, double vision and photophobia.  Respiratory: Negative for cough, hemoptysis and shortness of breath.   Cardiovascular: Negative for palpitations, orthopnea and leg swelling.  Gastrointestinal: Negative for abdominal pain, diarrhea and vomiting.  Genitourinary: Negative for dysuria and urgency.  Musculoskeletal: Positive for falls, joint pain and myalgias. Negative for neck pain.  Skin: Negative for rash.  Neurological: Negative for dizziness, focal weakness, seizures, weakness and headaches.  Psychiatric/Behavioral: Positive for memory loss. The patient does not have insomnia.      DRUG ALLERGIES:   Allergies  Allergen Reactions  . Ciprofloxacin     Other reaction(s): Hallucinations  . Latex   . Penicillins Hives    Has patient had a PCN reaction causing immediate rash, facial/tongue/throat swelling, SOB or lightheadedness with hypotension: Yes Has patient had a PCN reaction causing severe rash involving mucus membranes or skin necrosis: No Has patient had a PCN reaction that required hospitalization Yes Has patient had a PCN reaction occurring within the last 10 years: No If all of the above answers are "NO", then may proceed with Cephalosporin use.  . Sulfa Antibiotics     "hives"  . Tetanus Toxoids     "I had a strong reaction".    VITALS:  Blood pressure (!) 151/60, pulse 86, temperature 98.1 F (36.7 C),  temperature source Oral, resp. rate (!) 22, height 5\' 3"  (1.6 m), weight 79.5 kg (175 lb 4.3 oz), SpO2 99 %.  PHYSICAL EXAMINATION:  GENERAL:  71 y.o.-year-old patient lying in the bed with no acute distress.  EYES: Pupils equal, round, reactive to light and accommodation. No scleral icterus. Extraocular muscles intact.  HEENT: Head atraumatic, normocephalic. Oropharynx and nasopharynx clear.  NECK:  Supple, no jugular venous distention. No thyroid enlargement, no tenderness.  LUNGS: Normal breath sounds bilaterally, no wheezing, rales,rhonchi or crepitation. No use of accessory muscles of respiration.  CARDIOVASCULAR: S1, S2 normal. No murmurs, rubs, or gallops.  ABDOMEN: Soft, nontender, nondistended. Bowel sounds present. No organomegaly or mass.  EXTREMITIES:bilateral leg mild red erythema, but no edema NEUROLOGIC: Cranial nerves II through XII are intact. Strength decreased in both legs  And arms.. Sensation intact. Gait not checked.  PSYCHIATRIC: The patient is alert and oriented x 3.  SKIN: Patient has lot of incontinence associated dermatitis and fungal overgrowth.   LABORATORY PANEL:   CBC  Recent Labs Lab 01/21/16 0104  WBC 10.6  HGB 11.6*  HCT 34.2*  PLT 376   ------------------------------------------------------------------------------------------------------------------  Chemistries   Recent Labs Lab 01/20/16 1913  01/22/16 0435 01/23/16 0430  NA 132*  < > 137  --   K 3.5  < > 3.5 2.6*  CL 101  < > 115*  --   CO2 14*  < > 14*  --   GLUCOSE 370*  < > 115*  --   BUN 7  < > <5*  --   CREATININE 0.88  < >  0.52  --   CALCIUM 9.5  < > 8.6*  --   MG  --   < >  --  1.6*  AST 20  --   --   --   ALT 9*  --   --   --   ALKPHOS 80  --   --   --   BILITOT 2.4*  --   --   --   < > = values in this interval not displayed. ------------------------------------------------------------------------------------------------------------------  Cardiac Enzymes  Recent  Labs Lab 01/20/16 1913  TROPONINI <0.03   ------------------------------------------------------------------------------------------------------------------  RADIOLOGY:  Ct Abdomen Pelvis W Contrast  Result Date: 01/22/2016 CLINICAL DATA:  Nausea, vomiting and elevated blood sugar. EXAM: CT ABDOMEN AND PELVIS WITH CONTRAST TECHNIQUE: Multidetector CT imaging of the abdomen and pelvis was performed using the standard protocol following bolus administration of intravenous contrast. CONTRAST:  ISOVUE-300 IOPAMIDOL (ISOVUE-300) INJECTION 61% COMPARISON:  October 05, 2015 FINDINGS: Lower chest: There is mild scar of the left lung base. No acute abnormality is identified. Hepatobiliary: There is diffuse low density of liver without vessel displacement. No focal liver lesion is identified. Patient status post prior cholecystectomy. The common bowel duct measures 1.3 cm, consistent with postsurgical dilatation. There is no intrahepatic biliary duct dilatation. Pancreas: The pancreas is atrophic. There are cysts within the pancreas. There is 1 cyst in the body/tail of the pancreas measuring 1.2 cm. There is a cyst extending off of the inferior head of the pancreas measuring 2 cm. These are unchanged compared to prior exam. Spleen: Normal in size without focal abnormality. Adrenals/Urinary Tract: Adrenal glands are unremarkable. Kidneys are normal, without renal calculi, focal lesion, or hydronephrosis. The bladder is diffusely thick wall with Foley catheter in place. Stomach/Bowel: There is no small bowel obstruction. There is abnormal diffuse thick walled descending and sigmoid colon and to a lesser degree transverse colon. Patient status post prior appendectomy. Vascular/Lymphatic: Aortic atherosclerosis. No enlarged abdominal or pelvic lymph nodes. Reproductive: There is a 4.5 x 3.7 x 3.9 cm heterogeneous density mass at the junction of the uterus and cervix unchanged compared prior exam. There is  probably a uterine fibroid along the left aspect of the uterus unchanged. Other: No abdominal wall hernia or abnormality. No abdominopelvic ascites. Musculoskeletal: Degenerative joint changes of the spine are identified. Mild decreased vertebral body height of L1 is unchanged. IMPRESSION: Abnormal diffuse bowel wall thickening of the descending and sigmoid colon and to a lesser degree transverse colon. The findings are consistent with colitis. There is no small bowel obstruction. Diffuse thick walled bladder.  Cystitis is not excluded. Cystic masses within the pancreas unchanged compared to prior CT. Mass in the junction of the uterus/ cervix, unchanged compared to prior exam. Electronically Signed   By: Sherian Rein M.D.   On: 01/22/2016 16:44    EKG:   Orders placed or performed during the hospital encounter of 01/20/16  . EKG 12-Lead  . EKG 12-Lead    ASSESSMENT AND PLAN:   Altered mental status secondary to DKA: Received a insulin drip, anion gap is closed. Converted from insulin drip to Lantus ,SSI.  #2 DKA: Resolved, on Lantus 10 units daily, NovoLog with coverage.   #3 severe hypokalemia, hypomagnesemia: Pharmacy consulted for  Electrolyte management.  #4 hypotension; due to hypovolemic shock: Improved.  #5 altered mental status, history of previous stroke: Likely due to combination of DKA, metabolic acidosis. Mental status improved, stopped  Wellbutrin and flexeril Use Tylenol for pain control.  #  6 elevated lipase CT abdomen showed colitis. Started on Flagyl IV. Follow the trend of lipase, liver functions.  ;   7 /history of previous stroke: Continue Plavix, statins; poor performance status at baseline. PT consultConsulted, recommended 24 hour home health.   #8 diaper dermatitis: Seen by wound care nurse, started on Diflucan, continue diaper rash cream,foley to help with healing,  Continue IV antibiotics, continue Reglan for nausea, replace the potassium, monitor the  trend for lipase, liver functions, pharmacy to manage electrolytes with hypomagnesemia and hypokalemia.    All the records are reviewed and case discussed with Care Management/Social Workerr. Management plans discussed with the patient, family and they are in agreement.  CODE STATUS:full  TOTAL TIME (Critical care time)TAKING CARE OF THIS PATIENT:40 minutes.   POSSIBLE D/C IN 3-4 DAYS, DEPENDING ON CLINICAL CONDITION.   Katha Hamming M.D on 01/23/2016 at 11:46 AM  Between 7am to 6pm - Pager - [phone]  After 6pm go to [email or web address] - password [email or web address] [facility]  [place] [facility]  Office  [phone]  CC: Primary care physician; Emogene Morgan, MD   Note: This dictation was prepared with Dragon dictation along with smaller phrase technology. Any transcriptional errors that result from this process are unintentional.

## 2016-01-23 NOTE — Progress Notes (Signed)
MEDICATION RELATED CONSULT NOTE - INITIAL   Pharmacy Consult for electrolyte management  Allergies  Allergen Reactions  . Ciprofloxacin     Other reaction(s): Hallucinations  . Latex   . Penicillins Hives    Has patient had a PCN reaction causing immediate rash, facial/tongue/throat swelling, SOB or lightheadedness with hypotension: Yes Has patient had a PCN reaction causing severe rash involving mucus membranes or skin necrosis: No Has patient had a PCN reaction that required hospitalization Yes Has patient had a PCN reaction occurring within the last 10 years: No If all of the above answers are "NO", then may proceed with Cephalosporin use.  . Sulfa Antibiotics     "hives"  . Tetanus Toxoids     "I had a strong reaction".    Patient Measurements: Height: 5\' 3"  (160 cm) Weight: 175 lb 4.3 oz (79.5 kg) IBW/kg (Calculated) : 52.4  Vital Signs: Temp: 98 F (36.7 C) (01/02 1519) Temp Source: Oral (01/02 1519) BP: 145/54 (01/02 1519) Pulse Rate: 88 (01/02 1519) Intake/Output from previous day: 01/01 0701 - 01/02 0700 In: 2586.7 [I.V.:2586.7] Out: 2050 [Urine:2050] Intake/Output from this shift: No intake/output data recorded.  Labs:  Recent Labs  01/20/16 1913 01/21/16 0104 01/21/16 0534  01/21/16 2129 01/22/16 0435 01/23/16 0430 01/23/16 1213  WBC 11.6* 10.6  --   --   --   --   --   --   HGB 12.4 11.6*  --   --   --   --   --   --   HCT 37.4 34.2*  --   --   --   --   --   --   PLT 425 376  --   --   --   --   --   --   CREATININE 0.88 0.74 0.52  < > 0.49 0.52  --  0.46  MG  --   --  1.5*  --   --   --  1.6*  --   ALBUMIN 2.5*  --   --   --   --   --   --  1.9*  PROT 6.9  --   --   --   --   --   --  5.6*  AST 20  --   --   --   --   --   --  12*  ALT 9*  --   --   --   --   --   --  8*  ALKPHOS 80  --   --   --   --   --   --  82  BILITOT 2.4*  --   --   --   --   --   --  0.7  < > = values in this interval not displayed. Estimated Creatinine Clearance:  67.2 mL/min (by C-G formula based on SCr of 0.46 mg/dL).  Lab Results  Component Value Date   K 3.7 01/23/2016    Assessment: 71 yo female with DKA and hypokalemia. Patient is now off of insulin drip.    Goal of Therapy:  K: 3.5-5 Mg: >1.8  Plan:  K= 3.7    Mg: 1.6  K WNL. No supplementation needed. Recheck k and mg in the AM.  Olene FlossMelissa D Emilo Gras, PharmD, BCPS Clinical Pharmacist 01/23/2016 7:11 PM

## 2016-01-23 NOTE — Progress Notes (Signed)
MEDICATION RELATED CONSULT NOTE - INITIAL   Pharmacy Consult for electrolyte management  Allergies  Allergen Reactions  . Ciprofloxacin     Other reaction(s): Hallucinations  . Latex   . Penicillins Hives    Has patient had a PCN reaction causing immediate rash, facial/tongue/throat swelling, SOB or lightheadedness with hypotension: Yes Has patient had a PCN reaction causing severe rash involving mucus membranes or skin necrosis: No Has patient had a PCN reaction that required hospitalization Yes Has patient had a PCN reaction occurring within the last 10 years: No If all of the above answers are "NO", then may proceed with Cephalosporin use.  . Sulfa Antibiotics     "hives"  . Tetanus Toxoids     "I had a strong reaction".    Patient Measurements: Height: 5\' 3"  (160 cm) Weight: 175 lb 4.3 oz (79.5 kg) IBW/kg (Calculated) : 52.4  Vital Signs: Temp: 98 F (36.7 C) (01/02 0440) Temp Source: Oral (01/02 0440) BP: 129/42 (01/02 0440) Pulse Rate: 85 (01/02 0440) Intake/Output from previous day: 01/01 0701 - 01/02 0700 In: 2586.7 [I.V.:2586.7] Out: 1300 [Urine:1300] Intake/Output from this shift: Total I/O In: 1675 [I.V.:1675] Out: 450 [Urine:450]  Labs:  Recent Labs  01/20/16 1913 01/21/16 0104 01/21/16 0534  01/21/16 1248 01/21/16 2129 01/22/16 0435 01/23/16 0430  WBC 11.6* 10.6  --   --   --   --   --   --   HGB 12.4 11.6*  --   --   --   --   --   --   HCT 37.4 34.2*  --   --   --   --   --   --   PLT 425 376  --   --   --   --   --   --   CREATININE 0.88 0.74 0.52  < > 0.57 0.49 0.52  --   MG  --   --  1.5*  --   --   --   --  1.6*  ALBUMIN 2.5*  --   --   --   --   --   --   --   PROT 6.9  --   --   --   --   --   --   --   AST 20  --   --   --   --   --   --   --   ALT 9*  --   --   --   --   --   --   --   ALKPHOS 80  --   --   --   --   --   --   --   BILITOT 2.4*  --   --   --   --   --   --   --   < > = values in this interval not  displayed. Estimated Creatinine Clearance: 67.2 mL/min (by C-G formula based on SCr of 0.52 mg/dL).  Lab Results  Component Value Date   K 2.6 (LL) 01/23/2016    Assessment: 71 yo female with DKA and hypokalemia. Patient is now off of insulin drip.    Goal of Therapy:  K: 3.5-5 Mg: >1.8  Plan:  K= 2.6    Mg: 1.6  MD already ordered KCL 40mEq x3 doses.  Will order Magnesium Sulfate 2gm IV x 1.  Will recheck K at 1800, and Mg with AM labs.   Rebecca Cairns  Tawanna Cooler, PharmD, BCPS Clinical Pharmacist 01/23/2016 5:55 AM

## 2016-01-23 NOTE — Progress Notes (Signed)
Pt. Potassium level is 2.6. Dr.Diamond notified and putting in orders.

## 2016-01-24 LAB — POTASSIUM
POTASSIUM: 3.3 mmol/L — AB (ref 3.5–5.1)
POTASSIUM: 4.8 mmol/L (ref 3.5–5.1)

## 2016-01-24 LAB — CBC
HCT: 30.2 % — ABNORMAL LOW (ref 35.0–47.0)
HEMOGLOBIN: 10.5 g/dL — AB (ref 12.0–16.0)
MCH: 30.3 pg (ref 26.0–34.0)
MCHC: 34.7 g/dL (ref 32.0–36.0)
MCV: 87.4 fL (ref 80.0–100.0)
PLATELETS: 308 10*3/uL (ref 150–440)
RBC: 3.45 MIL/uL — AB (ref 3.80–5.20)
RDW: 14.5 % (ref 11.5–14.5)
WBC: 7.1 10*3/uL (ref 3.6–11.0)

## 2016-01-24 LAB — GLUCOSE, CAPILLARY
GLUCOSE-CAPILLARY: 219 mg/dL — AB (ref 65–99)
GLUCOSE-CAPILLARY: 249 mg/dL — AB (ref 65–99)
GLUCOSE-CAPILLARY: 247 mg/dL — AB (ref 65–99)
Glucose-Capillary: 240 mg/dL — ABNORMAL HIGH (ref 65–99)

## 2016-01-24 LAB — MAGNESIUM: MAGNESIUM: 1.8 mg/dL (ref 1.7–2.4)

## 2016-01-24 LAB — PHOSPHORUS: Phosphorus: 1.6 mg/dL — ABNORMAL LOW (ref 2.5–4.6)

## 2016-01-24 MED ORDER — K PHOS MONO-SOD PHOS DI & MONO 155-852-130 MG PO TABS
500.0000 mg | ORAL_TABLET | Freq: Four times a day (QID) | ORAL | Status: DC
  Administered 2016-01-24 (×3): 500 mg via ORAL
  Filled 2016-01-24 (×3): qty 2

## 2016-01-24 MED ORDER — POTASSIUM CHLORIDE 20 MEQ PO PACK
40.0000 meq | PACK | Freq: Once | ORAL | Status: AC
  Administered 2016-01-24: 40 meq via ORAL
  Filled 2016-01-24: qty 2

## 2016-01-24 MED ORDER — K PHOS MONO-SOD PHOS DI & MONO 155-852-130 MG PO TABS: 500.0000 mg | ORAL_TABLET | Freq: Four times a day (QID) | ORAL | 0 refills | Status: DC

## 2016-01-24 MED ORDER — SODIUM CHLORIDE 0.9 % IV SOLN
30.0000 meq | INTRAVENOUS | Status: DC
  Administered 2016-01-24: 30 meq via INTRAVENOUS
  Filled 2016-01-24 (×2): qty 15

## 2016-01-24 MED ORDER — MAGNESIUM SULFATE 2 GM/50ML IV SOLN
2.0000 g | Freq: Once | INTRAVENOUS | Status: AC
  Administered 2016-01-24: 2 g via INTRAVENOUS
  Filled 2016-01-24: qty 50

## 2016-01-24 MED ORDER — INSULIN GLARGINE 100 UNIT/ML ~~LOC~~ SOLN
14.0000 [IU] | Freq: Every day | SUBCUTANEOUS | Status: DC
  Filled 2016-01-24: qty 0.14

## 2016-01-24 MED ORDER — METRONIDAZOLE 500 MG PO TABS: 500.0000 mg | ORAL_TABLET | Freq: Three times a day (TID) | ORAL | 0 refills | Status: DC

## 2016-01-24 MED ORDER — METOCLOPRAMIDE HCL 10 MG PO TABS: 10.0000 mg | ORAL_TABLET | Freq: Three times a day (TID) | ORAL | 2 refills | Status: AC

## 2016-01-24 MED ORDER — INSULIN GLARGINE 100 UNIT/ML SOLOSTAR PEN: 14.0000 [IU] | PEN_INJECTOR | Freq: Every day | SUBCUTANEOUS | 11 refills | Status: DC

## 2016-01-24 MED ORDER — ACETAMINOPHEN 325 MG PO TABS: 650.0000 mg | ORAL_TABLET | Freq: Four times a day (QID) | ORAL | 2 refills | Status: AC | PRN

## 2016-01-24 NOTE — Progress Notes (Signed)
MEDICATION RELATED CONSULT NOTE - INITIAL   Pharmacy Consult for electrolyte management  Allergies  Allergen Reactions  . Ciprofloxacin     Other reaction(s): Hallucinations  . Latex   . Penicillins Hives    Has patient had a PCN reaction causing immediate rash, facial/tongue/throat swelling, SOB or lightheadedness with hypotension: Yes Has patient had a PCN reaction causing severe rash involving mucus membranes or skin necrosis: No Has patient had a PCN reaction that required hospitalization Yes Has patient had a PCN reaction occurring within the last 10 years: No If all of the above answers are "NO", then may proceed with Cephalosporin use.  . Sulfa Antibiotics     "hives"  . Tetanus Toxoids     "I had a strong reaction".    Patient Measurements: Height: 5\' 3"  (160 cm) Weight: 175 lb 4.3 oz (79.5 kg) IBW/kg (Calculated) : 52.4  Vital Signs: Temp: 97.9 F (36.6 C) (01/03 1622) Temp Source: Oral (01/03 1622) BP: 140/61 (01/03 1622) Pulse Rate: 103 (01/03 1622) Intake/Output from previous day: 01/02 0701 - 01/03 0700 In: 1180 [P.O.:1080; IV Piggyback:100] Out: 1050 [Urine:1050] Intake/Output from this shift: No intake/output data recorded.  Labs:  Recent Labs  01/21/16 2129 01/22/16 0435 01/23/16 0430 01/23/16 1213 01/24/16 0356  WBC  --   --   --   --  7.1  HGB  --   --   --   --  10.5*  HCT  --   --   --   --  30.2*  PLT  --   --   --   --  308  CREATININE 0.49 0.52  --  0.46  --   MG  --   --  1.6*  --  1.8  PHOS  --   --   --   --  1.6*  ALBUMIN  --   --   --  1.9*  --   PROT  --   --   --  5.6*  --   AST  --   --   --  12*  --   ALT  --   --   --  8*  --   ALKPHOS  --   --   --  82  --   BILITOT  --   --   --  0.7  --    Estimated Creatinine Clearance: 67.2 mL/min (by C-G formula based on SCr of 0.46 mg/dL).  Lab Results  Component Value Date   K 4.8 01/24/2016    Assessment: 71 yo female with DKA and hypokalemia. Patient is now off of insulin  drip.    Goal of Therapy:  K: 3.5-5 Mg: >1.8  Plan:  Ordered KCl 30mEq IV, 40 mEq PO x 1. Phos also low, will give KPhos 2 tab QID x 4 doses and supplement magnesium in the setting of continued hypokalemia; magnesium sulfate 2gm IV x 1.  Recheck potassium this evening.  1/03:  K @ 18:30 = 4.8. No K supplementation needed at this time.  Will recheck K on 1/04 with AM labs.   Cinch Ormond D Clinical Pharmacist  01/24/2016 7:04 PM

## 2016-01-24 NOTE — Care Management Note (Signed)
Case Management Note  Patient Details  Name: Sylvia Black MRN: [id number] Date of Birth: 01/04/1946  Subjective/Objective:    Patient will discharge home with [facility] of care from [facility] per dg, [name] Seever [phone]). tNo DME needs. Medical necessity completed. Spoke with dgt and she requests medical transport after 3 pm. PCP is Dr. Letta PateAycock.                Action/Plan: Resumption of care from [facility].   Expected Discharge Date:                  Expected Discharge Plan:  Home w Home Health Services  In-House Referral:     Discharge planning Services     Post Acute Care Choice:  Home Health, Resumption of Svcs/PTA Provider Choice offered to:     DME Arranged:    DME Agency:     HH Arranged:  RN HH Agency:  [facility]  Status of Service:  Completed, signed off  If discussed at Long Length of Stay Meetings, dates discussed:    Additional Comments:  Sylvia MemosLisa M Jadd Gasior, RN 01/24/2016, 11:48 AM

## 2016-01-24 NOTE — Care Management Important Message (Signed)
Important Message  Patient Details  Name: Sylvia Black MRN: [id number] Date of Birth: 08/26/1945   Medicare Important Message Given:  Yes    Marily MemosLisa M Lindberg Zenon, RN 01/24/2016, 11:57 AM

## 2016-01-24 NOTE — Progress Notes (Signed)
MEDICATION RELATED CONSULT NOTE - INITIAL   Pharmacy Consult for electrolyte management  Allergies  Allergen Reactions  . Ciprofloxacin     Other reaction(s): Hallucinations  . Latex   . Penicillins Hives    Has patient had a PCN reaction causing immediate rash, facial/tongue/throat swelling, SOB or lightheadedness with hypotension: Yes Has patient had a PCN reaction causing severe rash involving mucus membranes or skin necrosis: No Has patient had a PCN reaction that required hospitalization Yes Has patient had a PCN reaction occurring within the last 10 years: No If all of the above answers are "NO", then may proceed with Cephalosporin use.  . Sulfa Antibiotics     "hives"  . Tetanus Toxoids     "I had a strong reaction".    Patient Measurements: Height: 5\' 3"  (160 cm) Weight: 175 lb 4.3 oz (79.5 kg) IBW/kg (Calculated) : 52.4  Vital Signs: Temp: 98.1 F (36.7 C) (01/03 0740) Temp Source: Oral (01/03 0740) BP: 147/60 (01/03 0740) Pulse Rate: 85 (01/03 0740) Intake/Output from previous day: 01/02 0701 - 01/03 0700 In: 1180 [P.O.:1080; IV Piggyback:100] Out: 1050 [Urine:1050] Intake/Output from this shift: No intake/output data recorded.  Labs:  Recent Labs  01/21/16 2129 01/22/16 0435 01/23/16 0430 01/23/16 1213 01/24/16 0356  WBC  --   --   --   --  7.1  HGB  --   --   --   --  10.5*  HCT  --   --   --   --  30.2*  PLT  --   --   --   --  308  CREATININE 0.49 0.52  --  0.46  --   MG  --   --  1.6*  --  1.8  PHOS  --   --   --   --  1.6*  ALBUMIN  --   --   --  1.9*  --   PROT  --   --   --  5.6*  --   AST  --   --   --  12*  --   ALT  --   --   --  8*  --   ALKPHOS  --   --   --  82  --   BILITOT  --   --   --  0.7  --    Estimated Creatinine Clearance: 67.2 mL/min (by C-G formula based on SCr of 0.46 mg/dL).  Lab Results  Component Value Date   K 3.3 (L) 01/24/2016    Assessment: 71 yo female with DKA and hypokalemia. Patient is now off of  insulin drip.    Goal of Therapy:  K: 3.5-5 Mg: >1.8  Plan:  Ordered KCl 30mEq IV, 40 mEq PO x 1. Phos also low, will give KPhos 2 tab QID x 4 doses and supplement magnesium in the setting of continued hypokalemia; magnesium sulfate 2gm IV x 1.  Recheck potassium this evening.  Garlon HatchetJody Madonna Flegal, PharmD, BCPS Clinical Pharmacist  01/24/2016 1:55 PM

## 2016-01-24 NOTE — Progress Notes (Signed)
MEDICATION RELATED CONSULT NOTE - INITIAL   Pharmacy Consult for electrolyte management  Allergies  Allergen Reactions  . Ciprofloxacin     Other reaction(s): Hallucinations  . Latex   . Penicillins Hives    Has patient had a PCN reaction causing immediate rash, facial/tongue/throat swelling, SOB or lightheadedness with hypotension: Yes Has patient had a PCN reaction causing severe rash involving mucus membranes or skin necrosis: No Has patient had a PCN reaction that required hospitalization Yes Has patient had a PCN reaction occurring within the last 10 years: No If all of the above answers are "NO", then may proceed with Cephalosporin use.  . Sulfa Antibiotics     "hives"  . Tetanus Toxoids     "I had a strong reaction".    Patient Measurements: Height: 5\' 3"  (160 cm) Weight: 175 lb 4.3 oz (79.5 kg) IBW/kg (Calculated) : 52.4  Vital Signs: Temp: 98 F (36.7 C) (01/03 0328) Temp Source: Oral (01/03 0328) BP: 131/35 (01/03 0328) Pulse Rate: 86 (01/03 0328) Intake/Output from previous day: 01/02 0701 - 01/03 0700 In: 1180 [P.O.:1080; IV Piggyback:100] Out: 1050 [Urine:1050] Intake/Output from this shift: Total I/O In: 100 [IV Piggyback:100] Out: 1050 [Urine:1050]  Labs:  Recent Labs  01/21/16 2129 01/22/16 0435 01/23/16 0430 01/23/16 1213 01/24/16 0356  WBC  --   --   --   --  7.1  HGB  --   --   --   --  10.5*  HCT  --   --   --   --  30.2*  PLT  --   --   --   --  308  CREATININE 0.49 0.52  --  0.46  --   MG  --   --  1.6*  --  1.8  ALBUMIN  --   --   --  1.9*  --   PROT  --   --   --  5.6*  --   AST  --   --   --  12*  --   ALT  --   --   --  8*  --   ALKPHOS  --   --   --  82  --   BILITOT  --   --   --  0.7  --    Estimated Creatinine Clearance: 67.2 mL/min (by C-G formula based on SCr of 0.46 mg/dL).  Lab Results  Component Value Date   K 3.3 (L) 01/24/2016    Assessment: 71 yo female with DKA and hypokalemia. Patient is now off of insulin  drip.    Goal of Therapy:  K: 3.5-5 Mg: >1.8  Plan:  01/03 0356 K 3.3, Mg 1.8. Patient received 120 mEq oral potassium (40 mEq Q4H x 3 doses) yesterday. Will give potassium chloride 30 mEq/265 mL x 2 and recheck BMP this afternoon.  Carola FrostNathan A Tyiesha Brackney, PharmD, BCPS Clinical Pharmacist 01/24/2016 6:45 AM

## 2016-01-25 NOTE — Discharge Summary (Signed)
[facility] [place] [facility]   PATIENT NAME: Sylvia Black    MR#:  [id number]  DATE OF BIRTH:  21-Jan-1946  DATE OF ADMISSION:  01/20/2016   ADMITTING PHYSICIAN: Oralia Manis, MD  DATE OF DISCHARGE: 01/25/2016 12:06 AM  PRIMARY CARE PHYSICIAN: Emogene Morgan, MD   ADMISSION DIAGNOSIS:   Diabetic ketoacidosis without coma associated with type 1 diabetes mellitus (HCC) [E10.10] Acute pancreatitis, unspecified complication status, unspecified pancreatitis type [K85.90]  DISCHARGE DIAGNOSIS:   Principal Problem:   Diabetic ketoacidosis without coma associated with type 2 diabetes mellitus (HCC) Active Problems:   Hypertension   HLD (hyperlipidemia)   GERD (gastroesophageal reflux disease)   Anxiety   DKA (diabetic ketoacidoses) (HCC)   SECONDARY DIAGNOSIS:   Past Medical History:  Diagnosis Date  . Anxiety   . Bipolar disorder (HCC)   . Bullous pemphigoid   . 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    "left; see pain dr for that" (02/22/2013)  . Depression   . Family history of anesthesia complication    "daughter's w/PONV" (02/22/2013)  . Frequent UTI   . GERD (gastroesophageal reflux disease)   . Gout attack    "twice" (02/22/2013)  . High cholesterol   . Hypertension   . Pneumonia 1990's   "once"  . PONV (postoperative nausea and vomiting)    "makes me violently ill" (02/22/2013)  . Sleep apnea    "dx'd in the 1990's; don't wear mask" (02/22/2013)  . Stroke [facility]) 2010; 02/2012   denies residual; "balance issues since & weak on left side since" (02/22/2013)  . Type II diabetes mellitus (HCC)   . Vertigo     HOSPITAL COURSE:   71 year old female with past medical history significant for type 1 diabetes, hypertension, bipolar with anxiety and depression, frequent urinary tract infections, history of stroke, vertigo who is bed bound at baseline was brought in secondary to elevated blood sugars.  #1 DKA- metabolic acidosis on presentation. Was in ICU on insulin  drip. -Converted back to home dose of Lantus once anion gap is closed. Sugars have been better controlled in the hospital. Patient also has brittle diabetes and can become hypoglycemic easily.  #2 severe hypokalemia and hypomagnesemia-appreciate pharmacy managing the electrolytes. - replaced appropriately. Discharged on supplements as well.   #3 Hypotension- hypovolemic shock on admission. Improved with fluids.   #4 acute colitis-received meropenem in the hospital. Being discharged on Flagyl.   #5 history of stroke with left-sided weakness. Patient is bed bound at baseline. Continue Plavix and statin.   #6 diabetic gastroparesis-continue Reglan prior to meals.   #7 GERD-on Protonix.   Patient is at baseline and is being discharged home with home health. - -I  DISCHARGE CONDITIONS:   Guarded  CONSULTS OBTAINED:   None  DRUG ALLERGIES:   Allergies  Allergen Reactions  . Ciprofloxacin     Other reaction(s): Hallucinations  . Latex   . Penicillins Hives    Has patient had a PCN reaction causing immediate rash, facial/tongue/throat swelling, SOB or lightheadedness with hypotension: Yes Has patient had a PCN reaction causing severe rash involving mucus membranes or skin necrosis: No Has patient had a PCN reaction that required hospitalization Yes Has patient had a PCN reaction occurring within the last 10 years: No If all of the above answers are "NO", then may proceed with Cephalosporin use.  . Sulfa Antibiotics     "hives"  . Tetanus Toxoids     "I had a strong reaction".  DISCHARGE MEDICATIONS:   Allergies as of 01/25/2016      Reactions   Ciprofloxacin    Other reaction(s): Hallucinations   Latex    Penicillins Hives   Has patient had a PCN reaction causing immediate rash, facial/tongue/throat swelling, SOB or lightheadedness with hypotension: Yes Has patient had a PCN reaction causing severe rash involving mucus membranes or skin necrosis: No Has patient had a  PCN reaction that required hospitalization Yes Has patient had a PCN reaction occurring within the last 10 years: No If all of the above answers are "NO", then may proceed with Cephalosporin use.   Sulfa Antibiotics    "hives"   Tetanus Toxoids    "I had a strong reaction".      Medication List    STOP taking these medications   tiZANidine 4 MG tablet Commonly known as:  ZANAFLEX     TAKE these medications   acetaminophen 325 MG tablet Commonly known as:  TYLENOL Take 2 tablets (650 mg total) by mouth every 6 (six) hours as needed for mild pain, fever or headache.   ammonium lactate 12 % lotion Commonly known as:  LAC-HYDRIN Apply twice a day to feet bilaterally   atorvastatin 40 MG tablet Commonly known as:  LIPITOR Take 1 tablet (40 mg total) by mouth daily at 6 PM.   buPROPion 150 MG 24 hr tablet Commonly known as:  WELLBUTRIN XL Take 150 mg by mouth 2 (two) times daily.   clobetasol cream 0.05 % Commonly known as:  TEMOVATE Apply 1 application topically daily as needed.   clopidogrel 75 MG tablet Commonly known as:  PLAVIX Take 1 tablet (75 mg total) by mouth daily.   diclofenac sodium 1 % Gel Commonly known as:  VOLTAREN Apply topically 4 (four) times daily.   insulin aspart 100 UNIT/ML FlexPen Commonly known as:  NOVOLOG Inject 4 Units into the skin 3 (three) times daily with meals.   Insulin Glargine 100 UNIT/ML Solostar Pen Commonly known as:  LANTUS Inject 14 Units into the skin at bedtime. What changed:  how much to take   Insulin Pen Needle 32G X 4 MM Misc Commonly known as:  CARETOUCH PEN NEEDLES Use with flex pens for insulin   lidocaine 5 % Commonly known as:  LIDODERM Place 1 patch onto the skin daily. Remove & Discard patch within 12 hours or as directed by MD   metoCLOPramide 10 MG tablet Commonly known as:  REGLAN Take 1 tablet (10 mg total) by mouth 4 (four) times daily -  before meals and at bedtime.   metroNIDAZOLE 500 MG  tablet Commonly known as:  FLAGYL Take 1 tablet (500 mg total) by mouth 3 (three) times daily. X 5 more days   pantoprazole 40 MG tablet Commonly known as:  PROTONIX Take 40 mg by mouth 2 (two) times daily.   phosphorus 155-852-130 MG tablet Commonly known as:  K PHOS NEUTRAL Take 2 tablets (500 mg total) by mouth 4 (four) times daily.   predniSONE 5 MG tablet Commonly known as:  DELTASONE Take 1 tablet (5 mg total) by mouth daily with breakfast.        DISCHARGE INSTRUCTIONS:   1. PCP f/u in 1-2 weeks  DIET:   Cardiac diet and Diabetic diet  ACTIVITY:   Activity as tolerated and Activity as tolerated and no driving for today  OXYGEN:   Home Oxygen: No.  Oxygen Delivery: room air  DISCHARGE LOCATION:   home   If  you experience worsening of your admission symptoms, develop shortness of breath, life threatening emergency, suicidal or homicidal thoughts you must seek medical attention immediately by calling 911 or calling your MD immediately  if symptoms less severe.  You Must read complete instructions/literature along with all the possible adverse reactions/side effects for all the Medicines you take and that have been prescribed to you. Take any new Medicines after you have completely understood and accpet all the possible adverse reactions/side effects.   Please note  You were cared for by a hospitalist during your hospital stay. If you have any questions about your discharge medications or the care you received while you were in the hospital after you are discharged, you can call the unit and asked to speak with the hospitalist on call if the hospitalist that took care of you is not available. Once you are discharged, your primary care physician will handle any further medical issues. Please note that NO REFILLS for any discharge medications will be authorized once you are discharged, as it is imperative that you return to your primary care physician (or establish a  relationship with a primary care physician if you do not have one) for your aftercare needs so that they can reassess your need for medications and monitor your lab values.    On the day of Discharge:  VITAL SIGNS:   Blood pressure (!) 159/79, pulse 93, temperature 98.2 F (36.8 C), temperature source Oral, resp. rate 18, height 5\' 3"  (1.6 m), weight 79.5 kg (175 lb 4.3 oz), SpO2 100 %.  PHYSICAL EXAMINATION:    GENERAL:  71 y.o.-year-old patient lying in the bed with no acute distress.  EYES: Pupils equal, round, reactive to light and accommodation. No scleral icterus. Extraocular muscles intact.  HEENT: Head atraumatic, normocephalic. Oropharynx and nasopharynx clear.  NECK:  Supple, no jugular venous distention. No thyroid enlargement, no tenderness.  LUNGS: Normal breath sounds bilaterally, no wheezing, rales,rhonchi or crepitation. No use of accessory muscles of respiration. Decreased bibasilar breath sounds CARDIOVASCULAR: S1, S2 normal. No murmurs, rubs, or gallops.  ABDOMEN: Soft, non-tender, non-distended. Bowel sounds present. No organomegaly or mass.  EXTREMITIES: No  cyanosis, or clubbing. Bilateral upper extremity edema, both feet in heel protectors. NEUROLOGIC: Cranial nerves II through XII are intact. Muscle strength 3/5 in all extremities. Sensation intact. Gait not checked. Global weakness noted. PSYCHIATRIC: The patient is alert and oriented x 3.  SKIN: heel ulcers  DATA REVIEW:   CBC  Recent Labs Lab 01/24/16 0356  WBC 7.1  HGB 10.5*  HCT 30.2*  PLT 308    Chemistries   Recent Labs Lab 01/23/16 1213  01/24/16 0356 01/24/16 1823  NA 134*  --   --   --   K 3.4*  < > 3.3* 4.8  CL 109  --   --   --   CO2 17*  --   --   --   GLUCOSE 265*  --   --   --   BUN <5*  --   --   --   CREATININE 0.46  --   --   --   CALCIUM 8.2*  --   --   --   MG  --   --  1.8  --   AST 12*  --   --   --   ALT 8*  --   --   --   ALKPHOS 82  --   --   --   BILITOT  0.7   --   --   --   < > = values in this interval not displayed.   Microbiology Results  Results for orders placed or performed during the hospital encounter of 01/20/16  Urine culture     Status: None   Collection Time: 01/20/16  7:13 PM  Result Value Ref Range Status   Specimen Description URINE, CATHETERIZED  Final   Special Requests NONE  Final   Culture NO GROWTH Performed at [facility]   Final   Report Status 01/22/2016 FINAL  Final  MRSA PCR Screening     Status: None   Collection Time: 01/21/16 12:10 AM  Result Value Ref Range Status   MRSA by PCR NEGATIVE NEGATIVE Final    Comment:        The GeneXpert MRSA Assay (FDA approved for NASAL specimens only), is one component of a comprehensive MRSA colonization surveillance program. It is not intended to diagnose MRSA infection nor to guide or monitor treatment for MRSA infections.     RADIOLOGY:  No results found.   Management plans discussed with the patient, family and they are in agreement.  CODE STATUS:  Code Status History    Date Active Date Inactive Code Status Order ID Comments User Context   01/21/2016 12:06 AM 01/25/2016  3:12 AM DNR [id number]  Oralia Manis, MD Inpatient   08/06/2015  6:17 PM 08/09/2015  8:04 PM DNR [id number]  Houston Siren, MD Inpatient   07/01/2015 10:51 AM 07/07/2015  9:40 PM DNR [id number]  Erin Fulling, MD ED   07/01/2015 10:31 AM 07/01/2015 10:51 AM Full Code [id number]  Erin Fulling, MD ED   02/22/2013  4:38 AM 02/23/2013  4:50 PM Full Code [id number]  Dorothea Ogle, MD Inpatient    Questions for Most Recent Historical Code Status (Order [id number])    Question Answer Comment   In the event of cardiac or respiratory ARREST Do not call a "code blue"    In the event of cardiac or respiratory ARREST Do not perform Intubation, CPR, defibrillation or ACLS    In the event of cardiac or respiratory ARREST Use medication by any route, position, wound care, and other measures to relive pain and  suffering. May use oxygen, suction and manual treatment of airway obstruction as needed for comfort.         Advance Directive Documentation   Flowsheet Row Most Recent Value  Type of Advance Directive  Out of facility DNR (pink MOST or yellow form), Healthcare Power of Attorney  Pre-existing out of facility DNR order (yellow form or pink MOST form)  Yellow form placed in chart (order not valid for inpatient use)  "MOST" Form in Place?  No data      TOTAL TIME TAKING CARE OF THIS PATIENT: 38 minutes.    Enid Baas M.D on 01/25/2016 at 4:45 PM  Between 7am to 6pm - Pager - [phone]  After 6pm go to [email or web address] - Social research officer, government  [facility] [place] [facility]  Office  [phone]  CC: Primary care physician; Emogene Morgan, MD   Note: This dictation was prepared with Dragon dictation along with smaller phrase technology. Any transcriptional errors that result from this process are unintentional.

## 2016-03-14 ENCOUNTER — Inpatient Hospital Stay
Admission: EM | Admit: 2016-03-14 | Discharge: 2016-03-21 | DRG: 637 | Disposition: A | Discharge status: Hospice | Payer: Medicare HMO | Attending: Internal Medicine | Admitting: Internal Medicine

## 2016-03-14 ENCOUNTER — Emergency Department: Payer: Medicare HMO

## 2016-03-14 ENCOUNTER — Encounter: Payer: Self-pay | Admitting: Emergency Medicine

## 2016-03-14 DIAGNOSIS — F0630 Mood disorder due to known physiological condition, unspecified: Secondary | ICD-10-CM

## 2016-03-14 DIAGNOSIS — N390 Urinary tract infection, site not specified: Secondary | ICD-10-CM

## 2016-03-14 DIAGNOSIS — L89322 Pressure ulcer of left buttock, stage 2: Secondary | ICD-10-CM | POA: Diagnosis present

## 2016-03-14 DIAGNOSIS — F419 Anxiety disorder, unspecified: Secondary | ICD-10-CM | POA: Diagnosis present

## 2016-03-14 DIAGNOSIS — K3184 Gastroparesis: Secondary | ICD-10-CM | POA: Diagnosis present

## 2016-03-14 DIAGNOSIS — Z7401 Bed confinement status: Secondary | ICD-10-CM | POA: Diagnosis not present

## 2016-03-14 DIAGNOSIS — Z881 Allergy status to other antibiotic agents status: Secondary | ICD-10-CM

## 2016-03-14 DIAGNOSIS — E876 Hypokalemia: Secondary | ICD-10-CM | POA: Diagnosis present

## 2016-03-14 DIAGNOSIS — G4730 Sleep apnea, unspecified: Secondary | ICD-10-CM | POA: Diagnosis present

## 2016-03-14 DIAGNOSIS — I10 Essential (primary) hypertension: Secondary | ICD-10-CM | POA: Diagnosis present

## 2016-03-14 DIAGNOSIS — F319 Bipolar disorder, unspecified: Secondary | ICD-10-CM | POA: Diagnosis present

## 2016-03-14 DIAGNOSIS — L8990 Pressure ulcer of unspecified site, unspecified stage: Secondary | ICD-10-CM | POA: Insufficient documentation

## 2016-03-14 DIAGNOSIS — Z887 Allergy status to serum and vaccine status: Secondary | ICD-10-CM

## 2016-03-14 DIAGNOSIS — L89312 Pressure ulcer of right buttock, stage 2: Secondary | ICD-10-CM | POA: Diagnosis present

## 2016-03-14 DIAGNOSIS — B3749 Other urogenital candidiasis: Secondary | ICD-10-CM | POA: Diagnosis present

## 2016-03-14 DIAGNOSIS — E860 Dehydration: Secondary | ICD-10-CM | POA: Diagnosis present

## 2016-03-14 DIAGNOSIS — I69354 Hemiplegia and hemiparesis following cerebral infarction affecting left non-dominant side: Secondary | ICD-10-CM | POA: Diagnosis not present

## 2016-03-14 DIAGNOSIS — R601 Generalized edema: Secondary | ICD-10-CM | POA: Diagnosis present

## 2016-03-14 DIAGNOSIS — Z833 Family history of diabetes mellitus: Secondary | ICD-10-CM | POA: Diagnosis not present

## 2016-03-14 DIAGNOSIS — E1310 Other specified diabetes mellitus with ketoacidosis without coma: Secondary | ICD-10-CM | POA: Diagnosis not present

## 2016-03-14 DIAGNOSIS — R109 Unspecified abdominal pain: Secondary | ICD-10-CM

## 2016-03-14 DIAGNOSIS — E785 Hyperlipidemia, unspecified: Secondary | ICD-10-CM | POA: Diagnosis present

## 2016-03-14 DIAGNOSIS — Z9841 Cataract extraction status, right eye: Secondary | ICD-10-CM

## 2016-03-14 DIAGNOSIS — R739 Hyperglycemia, unspecified: Secondary | ICD-10-CM | POA: Diagnosis present

## 2016-03-14 DIAGNOSIS — N179 Acute kidney failure, unspecified: Secondary | ICD-10-CM | POA: Diagnosis present

## 2016-03-14 DIAGNOSIS — E1110 Type 2 diabetes mellitus with ketoacidosis without coma: Principal | ICD-10-CM | POA: Diagnosis present

## 2016-03-14 DIAGNOSIS — Z7189 Other specified counseling: Secondary | ICD-10-CM | POA: Diagnosis not present

## 2016-03-14 DIAGNOSIS — Z6828 Body mass index (BMI) 28.0-28.9, adult: Secondary | ICD-10-CM

## 2016-03-14 DIAGNOSIS — M109 Gout, unspecified: Secondary | ICD-10-CM | POA: Diagnosis present

## 2016-03-14 DIAGNOSIS — D509 Iron deficiency anemia, unspecified: Secondary | ICD-10-CM | POA: Diagnosis present

## 2016-03-14 DIAGNOSIS — Z880 Allergy status to penicillin: Secondary | ICD-10-CM

## 2016-03-14 DIAGNOSIS — Z8249 Family history of ischemic heart disease and other diseases of the circulatory system: Secondary | ICD-10-CM

## 2016-03-14 DIAGNOSIS — K219 Gastro-esophageal reflux disease without esophagitis: Secondary | ICD-10-CM | POA: Diagnosis present

## 2016-03-14 DIAGNOSIS — Z66 Do not resuscitate: Secondary | ICD-10-CM | POA: Diagnosis present

## 2016-03-14 DIAGNOSIS — Z87440 Personal history of urinary (tract) infections: Secondary | ICD-10-CM

## 2016-03-14 DIAGNOSIS — Z515 Encounter for palliative care: Secondary | ICD-10-CM

## 2016-03-14 DIAGNOSIS — E43 Unspecified severe protein-calorie malnutrition: Secondary | ICD-10-CM | POA: Diagnosis present

## 2016-03-14 DIAGNOSIS — E1143 Type 2 diabetes mellitus with diabetic autonomic (poly)neuropathy: Secondary | ICD-10-CM | POA: Diagnosis present

## 2016-03-14 DIAGNOSIS — E7800 Pure hypercholesterolemia, unspecified: Secondary | ICD-10-CM | POA: Diagnosis present

## 2016-03-14 DIAGNOSIS — Z9049 Acquired absence of other specified parts of digestive tract: Secondary | ICD-10-CM

## 2016-03-14 DIAGNOSIS — Z882 Allergy status to sulfonamides status: Secondary | ICD-10-CM

## 2016-03-14 DIAGNOSIS — Z981 Arthrodesis status: Secondary | ICD-10-CM

## 2016-03-14 DIAGNOSIS — Z9842 Cataract extraction status, left eye: Secondary | ICD-10-CM

## 2016-03-14 DIAGNOSIS — K529 Noninfective gastroenteritis and colitis, unspecified: Secondary | ICD-10-CM | POA: Diagnosis present

## 2016-03-14 DIAGNOSIS — Z91040 Latex allergy status: Secondary | ICD-10-CM

## 2016-03-14 DIAGNOSIS — Z961 Presence of intraocular lens: Secondary | ICD-10-CM | POA: Diagnosis present

## 2016-03-14 DIAGNOSIS — Z7722 Contact with and (suspected) exposure to environmental tobacco smoke (acute) (chronic): Secondary | ICD-10-CM | POA: Diagnosis present

## 2016-03-14 LAB — BASIC METABOLIC PANEL
Anion gap: 17 — ABNORMAL HIGH (ref 5–15)
BUN: 26 mg/dL — AB (ref 6–20)
CO2: 25 mmol/L (ref 22–32)
CREATININE: 1.19 mg/dL — AB (ref 0.44–1.00)
Calcium: 7.2 mg/dL — ABNORMAL LOW (ref 8.9–10.3)
Chloride: 91 mmol/L — ABNORMAL LOW (ref 101–111)
GFR calc non Af Amer: 46 mL/min — ABNORMAL LOW (ref >60)
GFR, EST AFRICAN AMERICAN: 53 mL/min — AB (ref >60)
Glucose, Bld: 579 mg/dL (ref 65–99)
Potassium: 2.9 mmol/L — ABNORMAL LOW (ref 3.5–5.1)
SODIUM: 133 mmol/L — AB (ref 135–145)

## 2016-03-14 LAB — URINALYSIS, COMPLETE (UACMP) WITH MICROSCOPIC
BACTERIA UA: NONE SEEN
Bilirubin Urine: NEGATIVE
KETONES UR: 20 mg/dL — AB
NITRITE: NEGATIVE
PROTEIN: 100 mg/dL — AB
Specific Gravity, Urine: 1.018 (ref 1.005–1.030)
Squamous Epithelial / LPF: NONE SEEN
pH: 5 (ref 5.0–8.0)

## 2016-03-14 LAB — GLUCOSE, CAPILLARY
GLUCOSE-CAPILLARY: 574 mg/dL — AB (ref 65–99)
GLUCOSE-CAPILLARY: 406 mg/dL — AB (ref 65–99)
Glucose-Capillary: 369 mg/dL — ABNORMAL HIGH (ref 65–99)
Glucose-Capillary: 464 mg/dL — ABNORMAL HIGH (ref 65–99)
Glucose-Capillary: 487 mg/dL — ABNORMAL HIGH (ref 65–99)

## 2016-03-14 LAB — MRSA PCR SCREENING: MRSA BY PCR: NEGATIVE

## 2016-03-14 LAB — CBC
HEMATOCRIT: 25.9 % — AB (ref 35.0–47.0)
Hemoglobin: 8.4 g/dL — ABNORMAL LOW (ref 12.0–16.0)
MCH: 27.1 pg (ref 26.0–34.0)
MCHC: 32.4 g/dL (ref 32.0–36.0)
MCV: 83.7 fL (ref 80.0–100.0)
PLATELETS: 419 10*3/uL (ref 150–440)
RBC: 3.1 MIL/uL — ABNORMAL LOW (ref 3.80–5.20)
RDW: 15.5 % — AB (ref 11.5–14.5)
WBC: 9.8 10*3/uL (ref 3.6–11.0)

## 2016-03-14 MED ORDER — SODIUM CHLORIDE 0.9 % IV SOLN
30.0000 meq | Freq: Once | INTRAVENOUS | Status: AC
  Administered 2016-03-15: 30 meq via INTRAVENOUS
  Filled 2016-03-14: qty 15

## 2016-03-14 MED ORDER — SODIUM CHLORIDE 0.9 % IV SOLN
1000.0000 mL | Freq: Once | INTRAVENOUS | Status: AC
  Administered 2016-03-14: 1000 mL via INTRAVENOUS

## 2016-03-14 MED ORDER — CIPROFLOXACIN IN D5W 400 MG/200ML IV SOLN
400.0000 mg | Freq: Two times a day (BID) | INTRAVENOUS | Status: DC
  Administered 2016-03-15: 400 mg via INTRAVENOUS
  Filled 2016-03-14 (×2): qty 200

## 2016-03-14 MED ORDER — SODIUM CHLORIDE 0.9 % IV SOLN
INTRAVENOUS | Status: DC
  Administered 2016-03-14: 23:00:00 via INTRAVENOUS

## 2016-03-14 MED ORDER — SODIUM CHLORIDE 0.9 % IV SOLN: INTRAVENOUS | Status: AC

## 2016-03-14 MED ORDER — DEXTROSE-NACL 5-0.45 % IV SOLN
INTRAVENOUS | Status: DC
  Administered 2016-03-15: 02:00:00 via INTRAVENOUS

## 2016-03-14 MED ORDER — SODIUM CHLORIDE 0.9 % IV SOLN
INTRAVENOUS | Status: DC
  Filled 2016-03-14: qty 2.5

## 2016-03-14 MED ORDER — SODIUM CHLORIDE 0.9 % IV SOLN: 1000.0000 mL | Freq: Once | INTRAVENOUS | Status: DC

## 2016-03-14 MED ORDER — SODIUM CHLORIDE 0.9 % IV SOLN
INTRAVENOUS | Status: DC
  Administered 2016-03-14: 3.5 [IU]/h via INTRAVENOUS

## 2016-03-14 MED ORDER — CIPROFLOXACIN IN D5W 400 MG/200ML IV SOLN
400.0000 mg | Freq: Once | INTRAVENOUS | Status: AC
  Administered 2016-03-14: 400 mg via INTRAVENOUS
  Filled 2016-03-14: qty 200

## 2016-03-14 MED ORDER — ENOXAPARIN SODIUM 40 MG/0.4ML ~~LOC~~ SOLN
40.0000 mg | SUBCUTANEOUS | Status: DC
  Administered 2016-03-15 – 2016-03-17 (×3): 40 mg via SUBCUTANEOUS
  Filled 2016-03-14 (×2): qty 0.4

## 2016-03-14 NOTE — ED Notes (Signed)
[facility]  [facility]

## 2016-03-14 NOTE — ED Notes (Signed)
Radiology at bedside

## 2016-03-14 NOTE — ED Provider Notes (Signed)
[facility] Emergency Department Provider Note   ____________________________________________    I have reviewed the triage vital signs and the nursing notes.   HISTORY  Chief Complaint Hyperglycemia     HPI Sylvia Black is a 71 y.o. female Who presents with decreased appetite, generalized malaise and elevated blood sugar. Family reports patient has not been eating well over the last several months but this week has been much worse than usual. Patient reports mild nausea. Her glucose is also been elevated. They've been hesitant to give insulin because she is not eating. No fevers reported. Patient denies dysuria or frequency. 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 Anxiety   . Bipolar disorder (HCC)   . Bullous pemphigoid   . 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    "left; see pain dr for that" (02/22/2013)  . Depression   . Family history of anesthesia complication    "daughter's w/PONV" (02/22/2013)  . Frequent UTI   . GERD (gastroesophageal reflux disease)   . Gout attack    "twice" (02/22/2013)  . High cholesterol   . Hypertension   . Pneumonia 1990's   "once"  . PONV (postoperative nausea and vomiting)    "makes me violently ill" (02/22/2013)  . Sleep apnea    "dx'd in the 1990's; don't wear mask" (02/22/2013)  . Stroke [facility]) 2010; 02/2012   denies residual; "balance issues since & weak on left side since" (02/22/2013)  . Type II diabetes mellitus (HCC)   . Vertigo     Patient Active Problem List   Diagnosis Date Noted  . HLD (hyperlipidemia) 01/20/2016  . GERD (gastroesophageal reflux disease) 01/20/2016  . Anxiety 01/20/2016  . DKA (diabetic ketoacidoses) (HCC) 01/20/2016  . Depression 07/04/2015  . Altered mental status   . Diabetic ketoacidosis without coma associated with type 2 diabetes mellitus (HCC)   . Encounter for central line placement   .  Acidosis 07/01/2015  . Stroke (HCC)   . Hypertension   . Frequent UTI   . Diabetes mellitus without complication (HCC)   . Hypoglycemia 02/21/2013    Past Surgical History:  Procedure Laterality Date  . ANTERIOR CERVICAL DECOMP/DISCECTOMY FUSION  1989  . APPENDECTOMY  1984  . CATARACT EXTRACTION W/ INTRAOCULAR LENS  IMPLANT, BILATERAL Bilateral ~ 2008  . CHOLECYSTECTOMY  1984  . POSTERIOR FUSION CERVICAL SPINE  ~ 1988  . TUBAL LIGATION  1973  . UPPER ESOPHAGEAL ENDOSCOPIC ULTRASOUND (EUS) N/A 11/09/2015   Procedure: UPPER ESOPHAGEAL ENDOSCOPIC ULTRASOUND (EUS);  Surgeon: Rayann Heman, MD;  Location: [facility] ENDOSCOPY;  Service: Endoscopy;  Laterality: N/A;  . WRIST SURGERY Right ~ 1985   "tied off damagaed nerve" (02/22/2013)    Prior to Admission medications   Medication Sig Start Date End Date Taking? Authorizing Provider  acetaminophen (TYLENOL) 325 MG tablet Take 2 tablets (650 mg total) by mouth every 6 (six) hours as needed for mild pain, fever or headache. 01/24/16  Yes Enid Baas, MD  ammonium lactate (LAC-HYDRIN) 12 % lotion Apply twice a day to feet bilaterally 07/07/15  Yes Alford Highland, MD  buPROPion (WELLBUTRIN XL) 150 MG 24 hr tablet Take 150 mg by mouth 2 (two) times daily.    Yes Historical Provider, MD  diclofenac sodium (VOLTAREN) 1 % GEL Apply topically 4 (four) times daily.   Yes Historical Provider, MD  Insulin Glargine (LANTUS) 100  UNIT/ML Solostar Pen Inject 14 Units into the skin at bedtime. 01/24/16  Yes Enid Baas, MD  lidocaine (LIDODERM) 5 % Place 1 patch onto the skin daily. Remove & Discard patch within 12 hours or as directed by MD   Yes Historical Provider, MD  metoCLOPramide (REGLAN) 10 MG tablet Take 1 tablet (10 mg total) by mouth 4 (four) times daily -  before meals and at bedtime. 01/24/16  Yes Enid Baas, MD  ondansetron (ZOFRAN-ODT) 4 MG disintegrating tablet Take 4 mg by mouth 3 (three) times daily as needed. 03/06/16  Yes Historical  Provider, MD  tiZANidine (ZANAFLEX) 4 MG tablet Take 4 mg by mouth 2 (two) times daily. 12/30/14  Yes Historical Provider, MD  atorvastatin (LIPITOR) 40 MG tablet Take 1 tablet (40 mg total) by mouth daily at 6 PM. Patient not taking: Reported on 03/14/2016 07/07/15   Alford Highland, MD  clopidogrel (PLAVIX) 75 MG tablet Take 1 tablet (75 mg total) by mouth daily. Patient not taking: Reported on 03/14/2016 07/07/15   Alford Highland, MD  insulin aspart (NOVOLOG) 100 UNIT/ML FlexPen Inject 4 Units into the skin 3 (three) times daily with meals. Patient not taking: Reported on 03/14/2016 08/09/15   Enid Baas, MD  Insulin Pen Needle (CARETOUCH PEN NEEDLES) 32G X 4 MM MISC Use with flex pens for insulin 08/09/15   Enid Baas, MD  phosphorus (K PHOS NEUTRAL) [phone] MG tablet Take 2 tablets (500 mg total) by mouth 4 (four) times daily. Patient not taking: Reported on 03/14/2016 01/24/16   Katha Hamming, MD  predniSONE (DELTASONE) 5 MG tablet Take 1 tablet (5 mg total) by mouth daily with breakfast. Patient not taking: Reported on 03/14/2016 07/07/15   Alford Highland, MD     Allergies Ciprofloxacin; Latex; Penicillins; Sulfa antibiotics; and Tetanus toxoids  Family History  Problem Relation Age of Onset  . Heart disease Mother   . Diabetes Mother   . Heart disease Father     Social History Social History  Substance Use Topics  . Smoking status: Passive Smoke Exposure - Never Smoker  . Smokeless tobacco: Never Used  . Alcohol use Yes     Comment: 02/22/2013 "mixed drink on special occasions; glass of wine q 2-3 months"    Review of Systems  Constitutional: No fever/chills  Cardiovascular: Denies chest pain. Respiratory: Denies shortness of breath.No cough Gastrointestinal: No abdominal pain.  No nausea, no vomiting.   Genitourinary: Negative for dysuria.No frequency  Skin: Negative for rash. Neurological: Negative for headaches  10-point ROS otherwise  negative.  ____________________________________________   PHYSICAL EXAM:  VITAL SIGNS: ED Triage Vitals  Enc Vitals Group     BP 03/14/16 1701 (!) 153/73     Pulse Rate 03/14/16 1701 79     Resp 03/14/16 1701 (!) 21     Temp 03/14/16 1656 97.4 F (36.3 C)     Temp Source 03/14/16 1656 Oral     SpO2 03/14/16 1701 100 %     Weight 03/14/16 1650 175 lb (79.4 kg)     Height 03/14/16 1650 5\' 3"  (1.6 m)     Head Circumference --      Peak Flow --      Pain Score --      Pain Loc --      Pain Edu? --      Excl. in GC? --     Constitutional: Alert and oriented. No acute distress. Pleasant and interactive Eyes: Conjunctivae are normal.   Nose:  No congestion/rhinnorhea. Mouth/Throat: Mucous membranes are dry    Cardiovascular: Normal rate, regular rhythm. 1+ systolic ejection murmur. Good peripheral circulation. Respiratory: Normal respiratory effort.  No retractions. Lungs CTAB. Gastrointestinal: Soft and nontender. No distention.  Genitourinary: deferred Musculoskeletal:   Warm and well perfused  Skin:  Skin is warm, dry and intact. No rash noted.   ____________________________________________   LABS (all labs ordered are listed, but only abnormal results are displayed)  Labs Reviewed  BASIC METABOLIC PANEL - Abnormal; Notable for the following:       Result Value   Sodium 133 (*)    Potassium 2.9 (*)    Chloride 91 (*)    Glucose, Bld 579 (*)    BUN 26 (*)    Creatinine, Ser 1.19 (*)    Calcium 7.2 (*)    GFR calc non Af Amer 46 (*)    GFR calc Af Amer 53 (*)    Anion gap 17 (*)    All other components within normal limits  URINALYSIS, COMPLETE (UACMP) WITH MICROSCOPIC - Abnormal; Notable for the following:    Color, Urine YELLOW (*)    APPearance TURBID (*)    Glucose, UA >=500 (*)    Hgb urine dipstick MODERATE (*)    Ketones, ur 20 (*)    Protein, ur 100 (*)    Leukocytes, UA MODERATE (*)    All other components within normal limits  GLUCOSE,  CAPILLARY - Abnormal; Notable for the following:    Glucose-Capillary 574 (*)    All other components within normal limits  GLUCOSE, CAPILLARY - Abnormal; Notable for the following:    Glucose-Capillary 550 (*)    All other components within normal limits  CBC - Abnormal; Notable for the following:    RBC 3.10 (*)    Hemoglobin 8.4 (*)    HCT 25.9 (*)    RDW 15.5 (*)    All other components within normal limits  URINE CULTURE  CBG MONITORING, ED   ____________________________________________  EKG  ED ECG REPORT I, Jene Every, the attending physician, personally viewed and interpreted this ECG.  Date: 03/14/2016 EKG Time: 4:51 PM Rate: 6-8 Rhythm: normal sinus rhythm QRS Axis: normal Intervals: normal ST/T Wave abnormalities: normal Conduction Disturbances: none    ____________________________________________  RADIOLOGY  None ____________________________________________   PROCEDURES  Procedure(s) performed: No    Critical Care performed: yes  CRITICAL CARE Performed by: Jene Every   Total critical care time: 30 minutes  Critical care time was exclusive of separately billable procedures and treating other patients.  Critical care was necessary to treat or prevent imminent or life-threatening deterioration.  Critical care was time spent personally by me on the following activities: development of treatment plan with patient and/or surrogate as well as nursing, discussions with consultants, evaluation of patient's response to treatment, examination of patient, obtaining history from patient or surrogate, ordering and performing treatments and interventions, ordering and review of laboratory studies, ordering and review of radiographic studies, pulse oximetry and re-evaluation of patient's condition.  ____________________________________________   INITIAL IMPRESSION / ASSESSMENT AND PLAN / ED COURSE  Pertinent labs & imaging results that were  available during my care of the patient were reviewed by me and considered in my medical decision making (see chart for details).  Patient presents with worsening generalized weakness, malaise with hyperglycemia. Certainly DKA is a concern, we'll check labs give IV fluids check x-ray and urine and reevaluate.   ----------------------------------------- 6:40 PM on 03/14/2016 -----------------------------------------  Patient with hyperglycemia and elevated anion gap concerning for DKA. IV fluids given. She will likely require insulin drip. Also strongly suspect urinary tract infection based on consistency of urine  Urinalysis is consistent with UTI. Patient apparently has significant allergy to penicillin, I asked her if she has an allergy to Cipro and she denies despite it being listed in the computer. She says she has tolerated it before. Given severe reaction to PCN, cipro is the next best option since we are unable to give cephalosporins or zosyn   ____________________________________________   FINAL CLINICAL IMPRESSION(S) / ED DIAGNOSES  Final diagnoses:  Diabetic ketoacidosis without coma associated with type 2 diabetes mellitus (HCC)  Lower urinary tract infectious disease      NEW MEDICATIONS STARTED DURING THIS VISIT:  New Prescriptions   No medications on file     Note:  This document was prepared using Dragon voice recognition software and may include unintentional dictation errors.    Jene Everyobert Donovon Micheletti, MD 03/14/16 [phone]

## 2016-03-14 NOTE — ED Notes (Signed)
MD informed this RN to hold off on starting IV insulin at this time.

## 2016-03-14 NOTE — ED Triage Notes (Signed)
Pt comes into the ED via EMS from home c/o hyperglycemia.  Patient alert and oriented x4, daughter on her way.  Patient CBG >600.  Patient has h/o stroke with left sided deficits. Patient in NAD at this time with even and unlabored respirations and neurologically intact at baseline.

## 2016-03-14 NOTE — ED Notes (Signed)
ED Provider at bedside. 

## 2016-03-14 NOTE — Progress Notes (Signed)
Pharmacy Antibiotic Note  Sylvia Black is a 71 y.o. female admitted on 03/14/2016 with UTI.  Pharmacy has been consulted for ciprofloxacin dosing.  Plan: Ciprofloxacin 400 mg q 12 hours ordered.  Height: 5\' 3"  (160 cm) Weight: 175 lb (79.4 kg) IBW/kg (Calculated) : 52.4  Temp (24hrs), Avg:97.4 F (36.3 C), Min:97.4 F (36.3 C), Max:97.4 F (36.3 C)   Recent Labs Lab 03/14/16 1658 03/14/16 1724  WBC  --  9.8  CREATININE 1.19*  --     Estimated Creatinine Clearance: 45.1 mL/min (by C-G formula based on SCr of 1.19 mg/dL (H)).    Allergies  Allergen Reactions  . Ciprofloxacin     Other reaction(s): Hallucinations  . Latex   . Penicillins Hives    Has patient had a PCN reaction causing immediate rash, facial/tongue/throat swelling, SOB or lightheadedness with hypotension: Yes Has patient had a PCN reaction causing severe rash involving mucus membranes or skin necrosis: No Has patient had a PCN reaction that required hospitalization Yes Has patient had a PCN reaction occurring within the last 10 years: No If all of the above answers are "NO", then may proceed with Cephalosporin use.  . Sulfa Antibiotics     "hives"  . Tetanus Toxoids     "I had a strong reaction".    Antimicrobials this admission: ciprofloxacin 2/22 >>    >>   Dose adjustments this admission:   Microbiology results: 2/22 UCx: pending  2/22 MRSA PCR: pending    2/22 UA: LE(+) NO2(-) WBC TNTC  Thank you for allowing pharmacy to be a part of this patient's care.  Leean Amezcua S 03/14/2016 10:46 PM

## 2016-03-14 NOTE — H&P (Signed)
[facility] [place] at [facility]   PATIENT NAME: Sylvia Black    MR#:  [id number]  DATE OF BIRTH:  [birth year]  DATE OF ADMISSION:  03/14/2016  PRIMARY CARE PHYSICIAN: Emogene Morgan, MD   REQUESTING/REFERRING PHYSICIAN: Cyril Loosen, MD  CHIEF COMPLAINT:   Chief Complaint  Patient presents with  . Hyperglycemia    HISTORY OF PRESENT ILLNESS:  Sylvia Black  is a 71 y.o. female who presents with Malaise and nausea for the past several days. Patient is unable to contribute significantly to her history of present illness and she is a very poor historian. Here in the ED she was found to be in mild DKA, and has a UTI. Hospitals were called for admission.  PAST MEDICAL HISTORY:   Past Medical History:  Diagnosis Date  . Anxiety   . Bipolar disorder (HCC)   . Bullous pemphigoid   . 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    "left; see pain dr for that" (02/22/2013)  . Depression   . Family history of anesthesia complication    "daughter's w/PONV" (02/22/2013)  . Frequent UTI   . GERD (gastroesophageal reflux disease)   . Gout attack    "twice" (02/22/2013)  . High cholesterol   . Hypertension   . Pneumonia 1990's   "once"  . PONV (postoperative nausea and vomiting)    "makes me violently ill" (02/22/2013)  . Sleep apnea    "dx'd in the 1990's; don't wear mask" (02/22/2013)  . Stroke [facility]) 2010; 02/2012   denies residual; "balance issues since & weak on left side since" (02/22/2013)  . Type II diabetes mellitus (HCC)   . Vertigo     PAST SURGICAL HISTORY:   Past Surgical History:  Procedure Laterality Date  . ANTERIOR CERVICAL DECOMP/DISCECTOMY FUSION  1989  . APPENDECTOMY  1984  . CATARACT EXTRACTION W/ INTRAOCULAR LENS  IMPLANT, BILATERAL Bilateral ~ 2008  . CHOLECYSTECTOMY  1984  . POSTERIOR FUSION CERVICAL SPINE  ~ 1988  . TUBAL LIGATION  1973  . UPPER ESOPHAGEAL ENDOSCOPIC ULTRASOUND (EUS) N/A 11/09/2015   Procedure: UPPER ESOPHAGEAL ENDOSCOPIC ULTRASOUND (EUS);   Surgeon: Rayann Heman, MD;  Location: [facility] ENDOSCOPY;  Service: Endoscopy;  Laterality: N/A;  . WRIST SURGERY Right ~ 1985   "tied off damagaed nerve" (02/22/2013)    SOCIAL HISTORY:   Social History  Substance Use Topics  . Smoking status: Passive Smoke Exposure - Never Smoker  . Smokeless tobacco: Never Used  . Alcohol use Yes     Comment: 02/22/2013 "mixed drink on special occasions; glass of wine q 2-3 months"    FAMILY HISTORY:   Family History  Problem Relation Age of Onset  . Heart disease Mother   . Diabetes Mother   . Heart disease Father     DRUG ALLERGIES:   Allergies  Allergen Reactions  . Ciprofloxacin     Other reaction(s): Hallucinations  . Latex   . Penicillins Hives    Has patient had a PCN reaction causing immediate rash, facial/tongue/throat swelling, SOB or lightheadedness with hypotension: Yes Has patient had a PCN reaction causing severe rash involving mucus membranes or skin necrosis: No Has patient had a PCN reaction that required hospitalization Yes Has patient had a PCN reaction occurring within the last 10 years: No If all of the above answers are "NO", then may proceed with Cephalosporin use.  . Sulfa Antibiotics     "hives"  . Tetanus Toxoids     "I had a  strong reaction".    MEDICATIONS AT HOME:   Prior to Admission medications   Medication Sig Start Date End Date Taking? Authorizing Provider  acetaminophen (TYLENOL) 325 MG tablet Take 2 tablets (650 mg total) by mouth every 6 (six) hours as needed for mild pain, fever or headache. 01/24/16  Yes Enid Baas, MD  ammonium lactate (LAC-HYDRIN) 12 % lotion Apply twice a day to feet bilaterally 07/07/15  Yes Alford Highland, MD  buPROPion (WELLBUTRIN XL) 150 MG 24 hr tablet Take 150 mg by mouth 2 (two) times daily.    Yes Historical Provider, MD  diclofenac sodium (VOLTAREN) 1 % GEL Apply topically 4 (four) times daily.   Yes Historical Provider, MD  Insulin Glargine (LANTUS) 100 UNIT/ML  Solostar Pen Inject 14 Units into the skin at bedtime. 01/24/16  Yes Enid Baas, MD  lidocaine (LIDODERM) 5 % Place 1 patch onto the skin daily. Remove & Discard patch within 12 hours or as directed by MD   Yes Historical Provider, MD  metoCLOPramide (REGLAN) 10 MG tablet Take 1 tablet (10 mg total) by mouth 4 (four) times daily -  before meals and at bedtime. 01/24/16  Yes Enid Baas, MD  ondansetron (ZOFRAN-ODT) 4 MG disintegrating tablet Take 4 mg by mouth 3 (three) times daily as needed. 03/06/16  Yes Historical Provider, MD  tiZANidine (ZANAFLEX) 4 MG tablet Take 4 mg by mouth 2 (two) times daily. 12/30/14  Yes Historical Provider, MD  atorvastatin (LIPITOR) 40 MG tablet Take 1 tablet (40 mg total) by mouth daily at 6 PM. Patient not taking: Reported on 03/14/2016 07/07/15   Alford Highland, MD  clopidogrel (PLAVIX) 75 MG tablet Take 1 tablet (75 mg total) by mouth daily. Patient not taking: Reported on 03/14/2016 07/07/15   Alford Highland, MD  insulin aspart (NOVOLOG) 100 UNIT/ML FlexPen Inject 4 Units into the skin 3 (three) times daily with meals. Patient not taking: Reported on 03/14/2016 08/09/15   Enid Baas, MD  Insulin Pen Needle (CARETOUCH PEN NEEDLES) 32G X 4 MM MISC Use with flex pens for insulin 08/09/15   Enid Baas, MD  phosphorus (K PHOS NEUTRAL) [phone] MG tablet Take 2 tablets (500 mg total) by mouth 4 (four) times daily. Patient not taking: Reported on 03/14/2016 01/24/16   Katha Hamming, MD  predniSONE (DELTASONE) 5 MG tablet Take 1 tablet (5 mg total) by mouth daily with breakfast. Patient not taking: Reported on 03/14/2016 07/07/15   Alford Highland, MD    REVIEW OF SYSTEMS:  Review of Systems  Unable to perform ROS: Acuity of condition     VITAL SIGNS:   Vitals:   03/14/16 1930 03/14/16 2000 03/14/16 2030 03/14/16 2100  BP: (!) 182/86 (!) 152/73 (!) 153/74 (!) 155/65  Pulse:  89 76 74  Resp:  20 (!) 27 (!) 24  Temp:      TempSrc:       SpO2:  100% 100% 93%  Weight:      Height:       Wt Readings from Last 3 Encounters:  03/14/16 79.4 kg (175 lb)  01/21/16 79.5 kg (175 lb 4.3 oz)  08/06/15 117.9 kg (260 lb)    PHYSICAL EXAMINATION:  Physical Exam  Vitals reviewed. Constitutional: She appears well-developed and well-nourished. No distress.  HENT:  Head: Normocephalic and atraumatic.  Mouth/Throat: Oropharynx is clear and moist.  Eyes: Conjunctivae and EOM are normal. Pupils are equal, round, and reactive to light. No scleral icterus.  Neck: Normal range of motion.  Neck supple. No JVD present. No thyromegaly present.  Cardiovascular: Normal rate, regular rhythm and intact distal pulses.  Exam reveals no gallop and no friction rub.   No murmur heard. Respiratory: Effort normal and breath sounds normal. No respiratory distress. She has no wheezes. She has no rales.  GI: Soft. Bowel sounds are normal. She exhibits no distension. There is no tenderness.  Musculoskeletal: Normal range of motion. She exhibits no edema.  No arthritis, no gout  Lymphadenopathy:    She has no cervical adenopathy.  Neurological: She is alert. No cranial nerve deficit.  No dysarthria, no aphasia  Skin: Skin is warm and dry. No rash noted. No erythema.  Psychiatric:  Unable to assess due to patient condition    LABORATORY PANEL:   CBC  Recent Labs Lab 03/14/16 1724  WBC 9.8  HGB 8.4*  HCT 25.9*  PLT 419   ------------------------------------------------------------------------------------------------------------------  Chemistries   Recent Labs Lab 03/14/16 1658  NA 133*  K 2.9*  CL 91*  CO2 25  GLUCOSE 579*  BUN 26*  CREATININE 1.19*  CALCIUM 7.2*   ------------------------------------------------------------------------------------------------------------------  Cardiac Enzymes No results for input(s): TROPONINI in the last 168  hours. ------------------------------------------------------------------------------------------------------------------  RADIOLOGY:  Dg Chest Port 1 View  Result Date: 03/14/2016 CLINICAL DATA:  Weakness and SOB x 2 days, strong urine odor EXAM: PORTABLE CHEST - 1 VIEW COMPARISON:  01/20/2016 FINDINGS: Lungs are clear. Heart size and mediastinal contours are within normal limits. No effusion. Cervical fixation hardware again noted. IMPRESSION: No acute cardiopulmonary disease. Electronically Signed   By: Corlis Leak M.D.   On: 03/14/2016 18:55    EKG:   Orders placed or performed during the hospital encounter of 01/20/16  . EKG 12-Lead  . EKG 12-Lead    IMPRESSION AND PLAN:  Principal Problem:   Diabetic ketoacidosis without coma associated with type 2 diabetes mellitus (HCC) - we'll admit her to step down with insulin drip per DKA order set protocol. Active Problems:   Frequent UTI - culture sent from the ED, IV antibiotics and place   AKI (acute kidney injury) (HCC) - IV fluids and place, avoid nephrotoxins, monitor for expected improvement   Hypertension - continue home meds   Anxiety - home dose anxiolytics   GERD (gastroesophageal reflux disease) - home dose PPI  All the records are reviewed and case discussed with ED provider. Management plans discussed with the patient and/or family.  DVT PROPHYLAXIS: SubQ lovenox  GI PROPHYLAXIS: PPI  ADMISSION STATUS: Inpatient  CODE STATUS: DNR Code Status History    Date Active Date Inactive Code Status Order ID Comments User Context   01/21/2016 12:06 AM 01/25/2016  3:12 AM DNR [id number]  Oralia Manis, MD Inpatient   08/06/2015  6:17 PM 08/09/2015  8:04 PM DNR [id number]  Houston Siren, MD Inpatient   07/01/2015 10:51 AM 07/07/2015  9:40 PM DNR [id number]  Erin Fulling, MD ED   07/01/2015 10:31 AM 07/01/2015 10:51 AM Full Code [id number]  Erin Fulling, MD ED   02/22/2013  4:38 AM 02/23/2013  4:50 PM Full Code [id number]  Dorothea Ogle, MD  Inpatient    Questions for Most Recent Historical Code Status (Order [id number])    Question Answer Comment   In the event of cardiac or respiratory ARREST Do not call a "code blue"    In the event of cardiac or respiratory ARREST Do not perform Intubation, CPR, defibrillation or ACLS    In the event of cardiac or respiratory  ARREST Use medication by any route, position, wound care, and other measures to relive pain and suffering. May use oxygen, suction and manual treatment of airway obstruction as needed for comfort.         Advance Directive Documentation   Flowsheet Row Most Recent Value  Type of Advance Directive  Out of facility DNR (pink MOST or yellow form)  Pre-existing out of facility DNR order (yellow form or pink MOST form)  No data  "MOST" Form in Place?  No data      TOTAL TIME TAKING CARE OF THIS PATIENT: 45 minutes.    Korvin Valentine FIELDING 03/14/2016, 9:31 PM  [facility]  Office  [phone]  CC: Primary care physician; Emogene MorganAYCOCK, NGWE A, MD

## 2016-03-15 DIAGNOSIS — L8990 Pressure ulcer of unspecified site, unspecified stage: Secondary | ICD-10-CM | POA: Insufficient documentation

## 2016-03-15 LAB — BASIC METABOLIC PANEL
ANION GAP: 13 (ref 5–15)
ANION GAP: 12 (ref 5–15)
Anion gap: 14 (ref 5–15)
Anion gap: 13 (ref 5–15)
BUN: 20 mg/dL (ref 6–20)
BUN: 17 mg/dL (ref 6–20)
BUN: 17 mg/dL (ref 6–20)
BUN: 17 mg/dL (ref 6–20)
CALCIUM: 7.2 mg/dL — AB (ref 8.9–10.3)
CALCIUM: 7.2 mg/dL — AB (ref 8.9–10.3)
CALCIUM: 7.2 mg/dL — AB (ref 8.9–10.3)
CALCIUM: 7.1 mg/dL — AB (ref 8.9–10.3)
CO2: 24 mmol/L (ref 22–32)
CO2: 25 mmol/L (ref 22–32)
CO2: 22 mmol/L (ref 22–32)
CO2: 25 mmol/L (ref 22–32)
CREATININE: 0.54 mg/dL (ref 0.44–1.00)
CREATININE: 0.66 mg/dL (ref 0.44–1.00)
Chloride: 101 mmol/L (ref 101–111)
Chloride: 100 mmol/L — ABNORMAL LOW (ref 101–111)
Chloride: 99 mmol/L — ABNORMAL LOW (ref 101–111)
Chloride: 99 mmol/L — ABNORMAL LOW (ref 101–111)
Creatinine, Ser: 0.82 mg/dL (ref 0.44–1.00)
Creatinine, Ser: 0.53 mg/dL (ref 0.44–1.00)
GFR calc Af Amer: >60 mL/min (ref >60)
GFR calc Af Amer: >60 mL/min (ref >60)
GFR calc Af Amer: >60 mL/min (ref >60)
GFR calc Af Amer: >60 mL/min (ref >60)
GFR calc non Af Amer: >60 mL/min (ref >60)
GLUCOSE: 182 mg/dL — AB (ref 65–99)
GLUCOSE: 116 mg/dL — AB (ref 65–99)
Glucose, Bld: 141 mg/dL — ABNORMAL HIGH (ref 65–99)
Glucose, Bld: 111 mg/dL — ABNORMAL HIGH (ref 65–99)
POTASSIUM: 3.1 mmol/L — AB (ref 3.5–5.1)
POTASSIUM: 2.5 mmol/L — AB (ref 3.5–5.1)
POTASSIUM: 2.6 mmol/L — AB (ref 3.5–5.1)
POTASSIUM: 2.5 mmol/L — AB (ref 3.5–5.1)
SODIUM: 138 mmol/L (ref 135–145)
SODIUM: 137 mmol/L (ref 135–145)
SODIUM: 135 mmol/L (ref 135–145)
SODIUM: 137 mmol/L (ref 135–145)

## 2016-03-15 LAB — GLUCOSE, CAPILLARY
GLUCOSE-CAPILLARY: 106 mg/dL — AB (ref 65–99)
GLUCOSE-CAPILLARY: 127 mg/dL — AB (ref 65–99)
GLUCOSE-CAPILLARY: 138 mg/dL — AB (ref 65–99)
GLUCOSE-CAPILLARY: 321 mg/dL — AB (ref 65–99)
Glucose-Capillary: 235 mg/dL — ABNORMAL HIGH (ref 65–99)
Glucose-Capillary: 167 mg/dL — ABNORMAL HIGH (ref 65–99)
Glucose-Capillary: 110 mg/dL — ABNORMAL HIGH (ref 65–99)
Glucose-Capillary: 105 mg/dL — ABNORMAL HIGH (ref 65–99)
Glucose-Capillary: 131 mg/dL — ABNORMAL HIGH (ref 65–99)
Glucose-Capillary: 137 mg/dL — ABNORMAL HIGH (ref 65–99)
Glucose-Capillary: 102 mg/dL — ABNORMAL HIGH (ref 65–99)
Glucose-Capillary: 94 mg/dL (ref 65–99)

## 2016-03-15 LAB — C DIFFICILE QUICK SCREEN W PCR REFLEX
C Diff antigen: POSITIVE — AB
C Diff toxin: NEGATIVE

## 2016-03-15 LAB — CLOSTRIDIUM DIFFICILE BY PCR: Toxigenic C. Difficile by PCR: NEGATIVE

## 2016-03-15 MED ORDER — POTASSIUM CHLORIDE 20 MEQ PO PACK
40.0000 meq | PACK | Freq: Two times a day (BID) | ORAL | Status: DC
  Administered 2016-03-15 – 2016-03-20 (×7): 40 meq via ORAL
  Filled 2016-03-15 (×10): qty 2

## 2016-03-15 MED ORDER — INSULIN GLARGINE 100 UNIT/ML ~~LOC~~ SOLN
14.0000 [IU] | SUBCUTANEOUS | Status: DC
  Administered 2016-03-15 – 2016-03-16 (×2): 14 [IU] via SUBCUTANEOUS
  Filled 2016-03-15 (×3): qty 0.14

## 2016-03-15 MED ORDER — INSULIN ASPART 100 UNIT/ML ~~LOC~~ SOLN
4.0000 [IU] | Freq: Three times a day (TID) | SUBCUTANEOUS | Status: DC
  Filled 2016-03-15: qty 4

## 2016-03-15 MED ORDER — INSULIN ASPART 100 UNIT/ML ~~LOC~~ SOLN
0.0000 [IU] | Freq: Three times a day (TID) | SUBCUTANEOUS | Status: DC
  Administered 2016-03-15: 2 [IU] via SUBCUTANEOUS
  Administered 2016-03-18: 3 [IU] via SUBCUTANEOUS
  Administered 2016-03-18: 2 [IU] via SUBCUTANEOUS
  Administered 2016-03-19: 5 [IU] via SUBCUTANEOUS
  Administered 2016-03-20: 3 [IU] via SUBCUTANEOUS
  Administered 2016-03-20: 2 [IU] via SUBCUTANEOUS
  Administered 2016-03-21: 3 [IU] via SUBCUTANEOUS
  Administered 2016-03-21: 2 [IU] via SUBCUTANEOUS
  Filled 2016-03-15: qty 2
  Filled 2016-03-15 (×2): qty 3
  Filled 2016-03-15: qty 1
  Filled 2016-03-15: qty 3
  Filled 2016-03-15: qty 5
  Filled 2016-03-15: qty 3
  Filled 2016-03-15: qty 2

## 2016-03-15 MED ORDER — POTASSIUM CHLORIDE IN NACL 40-0.9 MEQ/L-% IV SOLN
INTRAVENOUS | Status: DC
  Administered 2016-03-15: 75 mL/h via INTRAVENOUS
  Filled 2016-03-15 (×5): qty 1000

## 2016-03-15 MED ORDER — DEXTROSE 5 % IV SOLN
1.0000 g | Freq: Every day | INTRAVENOUS | Status: DC
  Administered 2016-03-15 – 2016-03-16 (×2): 1 g via INTRAVENOUS
  Filled 2016-03-15 (×5): qty 10

## 2016-03-15 NOTE — Clinical Social Work Note (Signed)
Clinical Social Work Assessment  Patient Details  Name: Sylvia Black MRN: [id number] Date of Birth: 08/27/1945  Date of referral:  03/15/16               Reason for consult:  Facility Placement                Permission sought to share information with:  Family Supports Permission granted to share information::  Yes, Verbal Permission Granted  Name::        Agency::     Relationship::     Contact Information:     Housing/Transportation Living arrangements for the past 2 months:  Single Family Home Source of Information:  Adult Children Patient Interpreter Needed:  None Criminal Activity/Legal Involvement Pertinent to Current Situation/Hospitalization:  No - Comment as needed Significant Relationships:  Adult Children Lives with:  Adult Children Do you feel safe going back to the place where you live?  Yes Need for family participation in patient care:  Yes (Comment)  Care giving concerns:  Patient resides with her daughter: Sylvia Black.   Office managerocial Worker assessment / plan:  ICU nurse contacted CSW to say that patient's daughter, Sylvia Black, requested to speak with CSW. CSW spoke with patient's daughter in patient's ICU room. Patient is not appropriate for STR as the daughter states, patient is not ambulatory at baseline and that she requires a hoyer lift for transfers. Patient's daughter was interested in long term care placement or taking patient back home. Patient does not have medicaid and the daughter cannot afford to pay out of pocket for long term care. CSW advised that she apply for medicaid as soon as possible so that she will have a payor source in the future for long term care. Patient's daughter verbalized understanding. She stated that she would take patient back home with home health which she has already through [facility].   Employment status:  Retired Database administratornsurance information:  Managed Medicare PT Recommendations:  Not assessed at this time Information / Referral to community  resources:     Patient/Family's Response to care:  Patient's daughter expressed appreciation for CSW assistance.  Patient/Family's Understanding of and Emotional Response to Diagnosis, Current Treatment, and Prognosis:  Patient's daughter verbalized understanding that her current options are limited and thus will be planning to take her mother back home with resumption of home health.  Emotional Assessment Appearance:  Appears stated age Attitude/Demeanor/Rapport:   (pleasant but quiet) Affect (typically observed):  Calm Orientation:  Oriented to Self Alcohol / Substance use:  Not Applicable Psych involvement (Current and /or in the community):  No (Comment)  Discharge Needs  Concerns to be addressed:  Care Coordination Readmission within the last 30 days:  No Current discharge risk:  None Barriers to Discharge:  No Barriers Identified   York SpanielMonica Zitlali Primm, LCSW 03/15/2016, 3:57 PM

## 2016-03-15 NOTE — Care Management Note (Addendum)
Case Management Note  Patient Details  Name: Sylvia Black MRN: [id number] Date of Birth: 09/13/1945  Subjective/Objective:                  Received call from RN stating that daughter Sylvia Black [phone] wanted to talk to CM "because I can't take care of her anymore". Per daughter patient is very stubborn. Patient lives with her daughter and is refusing to eat- "she's stubborn".  Our call got disconnected- I attempted to call her back - left message. Per nursing daughter wants patient to go to SNF. CSW updated. [name] called back stating that she hasn't been able to walk for over a year- [name] literally has to use a Nurse, adultHoyer Lift to Wheelchair to get her to appointments. [facility]- she was last seen 8 months ago. Patient uses [facility] sometimes. She is not followed by outpatient behavioral health. Recently had to have 69 y/o dog put down- this escalated her depression. [name] states that she "is just stubborn" again. "I'm to the point now I can't do it by myself anymore". She does not meet criteria for Medicaid due to daughters income/household income. [facility] 2017 for rehab. [facility]  Action/Plan: PT evaluation pending. [facility] is aware that patient is here. Home health list provided to daughter. Psych consult may assist with evaluation of patient to see if she is depressed or any other behaviors that might impact patient's ability to care for self (e.g. Eat).   Expected Discharge Date:                  Expected Discharge Plan:     In-House Referral:  Clinical Social Work  Discharge planning Services  CM Consult  Post Acute Care Choice:  Home Health Choice offered to:  Adult Children  DME Arranged:    DME Agency:     HH Arranged:    HH Agency:     Status of Service:  In process, will continue to follow  If discussed at Long Length of Stay Meetings, dates discussed:    Additional  Comments:  [name], RN 03/15/2016, 1:56 PM

## 2016-03-15 NOTE — Progress Notes (Signed)
Pharmacy Antibiotic Note  Sylvia Black is a 71 y.o. female admitted on 03/14/2016 with UTI.  Pharmacy has been consulted for ciprofloxacin dosing.  Plan: Continue ciprofloxacin 400 mg IV q 12 hours.  Height: 5\' 3"  (160 cm) Weight: 162 lb 4.1 oz (73.6 kg) IBW/kg (Calculated) : 52.4  Temp (24hrs), Avg:97.9 F (36.6 C), Min:97.4 F (36.3 C), Max:98.6 F (37 C)   Recent Labs Lab 03/14/16 1658 03/14/16 1724 03/15/16 0303 03/15/16 0800 03/15/16 1018  WBC  --  9.8  --   --   --   CREATININE 1.19*  --  0.82 0.53 0.54    Estimated Creatinine Clearance: 64.7 mL/min (by C-G formula based on SCr of 0.54 mg/dL).    Allergies  Allergen Reactions  . Ciprofloxacin     Other reaction(s): Hallucinations  . Latex   . Penicillins Hives    Has patient had a PCN reaction causing immediate rash, facial/tongue/throat swelling, SOB or lightheadedness with hypotension: Yes Has patient had a PCN reaction causing severe rash involving mucus membranes or skin necrosis: No Has patient had a PCN reaction that required hospitalization Yes Has patient had a PCN reaction occurring within the last 10 years: No If all of the above answers are "NO", then may proceed with Cephalosporin use.  . Sulfa Antibiotics     "hives"  . Tetanus Toxoids     "I had a strong reaction".    Antimicrobials this admission: ciprofloxacin 2/22 >>    >>   Dose adjustments this admission:   Microbiology results: 2/22 UCx: pending  2/22 MRSA PCR: pending    2/22 UA: LE(+) NO2(-) WBC TNTC  Thank you for allowing pharmacy to be a part of this patient's care.  Luisa HartChristy, Conor Filsaime D 03/15/2016 1:45 PM

## 2016-03-15 NOTE — Progress Notes (Addendum)
Dr Elisabeth PigeonVachhani made aware of pt K+ 2.9-orders for NS 40K at 75 ml/hr and KCL 40 BID PO ordered.

## 2016-03-15 NOTE — Progress Notes (Addendum)
Inpatient Diabetes Program Recommendations  AACE/ADA: New Consensus Statement on Inpatient Glycemic Control (2015)  Target Ranges:  Prepandial:   less than 140 mg/dL      Peak postprandial:   less than 180 mg/dL (1-2 hours)      Critically ill patients:  140 - 180 mg/dL   Lab Results  Component Value Date   GLUCAP 106 (H) 03/15/2016   HGBA1C 9.1 (H) 08/06/2015    Review of Glycemic Control  Results for Sylvia Black, Sylvia Black (MRN [id number]) as of 03/15/2016 07:20  Ref. Range 03/15/2016 00:56 03/15/2016 02:02 03/15/2016 03:08 03/15/2016 05:12 03/15/2016 06:18  Glucose-Capillary Latest Ref Range: 65 - 99 mg/dL 119321 (H) 147235 (H) 829167 (H) 138 (H) 106 (H)    Diabetes history: Type 2 Outpatient Diabetes medications: Novolog 4 units tid- med list states patient has not been taking it  Current orders for Inpatient glycemic control: Phase 1 DKA (transition orders written)  Inpatient Diabetes Program Recommendations:  Please do not transition the patient off the drip until there are 4 consecutive CBG 140-180mg /dl at which time Lantus or Levemir should be given-  then the drip should run for 2 more hours before the drip is stopped. Acidosis must be cleared and MD must placed orders.   At the time the drip is stopped, give patient correction insulin (Novolog)based on the blood sugar at that time.   D/c phase 1 DKA orders when the DKA phase 2 is ordered.   Please call with questions.   Susette RacerJulie Red Mandt, RN, BA, MHA, CDE Diabetes Coordinator Inpatient Diabetes Program  [phone] (Team Pager) [phone] [facility] Office) 03/15/2016 7:25 AM

## 2016-03-15 NOTE — Progress Notes (Signed)
[employer] - [place] at [facility]   PATIENT NAME: Sylvia Black    MR#:  [id number]  DATE OF BIRTH:  11/29/1945  SUBJECTIVE:  CHIEF COMPLAINT:   Chief Complaint  Patient presents with  . Hyperglycemia   Came with DKA, found to have UTI.  Anion gap resolved, still have low K. For last one year- she is mostly bed bound.  REVIEW OF SYSTEMS:  CONSTITUTIONAL: No fever, fatigue or weakness.  EYES: No blurred or double vision.  EARS, NOSE, AND THROAT: No tinnitus or ear pain.  RESPIRATORY: No cough, shortness of breath, wheezing or hemoptysis.  CARDIOVASCULAR: No chest pain, orthopnea, edema.  GASTROINTESTINAL: No nausea, vomiting, diarrhea or abdominal pain.  GENITOURINARY: No dysuria, hematuria.  ENDOCRINE: No polyuria, nocturia,  HEMATOLOGY: No anemia, easy bruising or bleeding SKIN: No rash or lesion. MUSCULOSKELETAL: No joint pain or arthritis.   NEUROLOGIC: No tingling, numbness, weakness.  PSYCHIATRY: No anxiety or depression.   ROS  DRUG ALLERGIES:   Allergies  Allergen Reactions  . Ciprofloxacin     Other reaction(s): Hallucinations  . Latex   . Penicillins Hives    Has patient had a PCN reaction causing immediate rash, facial/tongue/throat swelling, SOB or lightheadedness with hypotension: Yes Has patient had a PCN reaction causing severe rash involving mucus membranes or skin necrosis: No Has patient had a PCN reaction that required hospitalization Yes Has patient had a PCN reaction occurring within the last 10 years: No If all of the above answers are "NO", then may proceed with Cephalosporin use.  . Sulfa Antibiotics     "hives"  . Tetanus Toxoids     "I had a strong reaction".    VITALS:  Blood pressure (!) 149/64, pulse (!) 115, temperature 98.3 F (36.8 C), temperature source Axillary, resp. rate 20, height 5\' 3"  (1.6 m), weight 73.6 kg (162 lb 4.1 oz), SpO2 100 %.  PHYSICAL EXAMINATION:  GENERAL:  71 y.o.-year-old patient lying in  the bed with no acute distress.  EYES: Pupils equal, round, reactive to light and accommodation. No scleral icterus. Extraocular muscles intact.  HEENT: Head atraumatic, normocephalic. Oropharynx and nasopharynx clear.  NECK:  Supple, no jugular venous distention. No thyroid enlargement, no tenderness.  LUNGS: Normal breath sounds bilaterally, no wheezing, rales,rhonchi or crepitation. No use of accessory muscles of respiration.  CARDIOVASCULAR: S1, S2 normal. No murmurs, rubs, or gallops.  ABDOMEN: Soft, nontender, nondistended. Bowel sounds present. No organomegaly or mass.  EXTREMITIES: No pedal edema, cyanosis, or clubbing.  NEUROLOGIC: Cranial nerves II through XII are intact. Muscle strength 3/5 in right upper extremity and 1-2/5 all other extremities. Sensation intact. Gait not checked.  PSYCHIATRIC: The patient is alert and oriented x 3.  SKIN: No obvious rash, lesion, or ulcer.   Physical Exam LABORATORY PANEL:   CBC  Recent Labs Lab 03/14/16 1724  WBC 9.8  HGB 8.4*  HCT 25.9*  PLT 419   ------------------------------------------------------------------------------------------------------------------  Chemistries   Recent Labs Lab 03/15/16 1431  NA 137  K 2.5*  CL 99*  CO2 25  GLUCOSE 111*  BUN 17  CREATININE 0.66  CALCIUM 7.1*   ------------------------------------------------------------------------------------------------------------------  Cardiac Enzymes No results for input(s): TROPONINI in the last 168 hours. ------------------------------------------------------------------------------------------------------------------  RADIOLOGY:  Dg Chest Port 1 View  Result Date: 03/14/2016 CLINICAL DATA:  Weakness and SOB x 2 days, strong urine odor EXAM: PORTABLE CHEST - 1 VIEW COMPARISON:  01/20/2016 FINDINGS: Lungs are clear. Heart size and mediastinal contours are within normal  limits. No effusion. Cervical fixation hardware again noted. IMPRESSION: No  acute cardiopulmonary disease. Electronically Signed   By: Corlis Leak M.D.   On: 03/14/2016 18:55    ASSESSMENT AND PLAN:   Principal Problem:   Diabetic ketoacidosis without coma associated with type 2 diabetes mellitus (HCC) Active Problems:   Hypertension   Frequent UTI   HLD (hyperlipidemia)   GERD (gastroesophageal reflux disease)   Anxiety   DKA (diabetic ketoacidoses) (HCC)   AKI (acute kidney injury) (HCC)   Pressure injury of skin   * DKA   IV insulin drip , resolved.   Started on basal [place] insulin.   Monitor on iss now.  * UTI   Ur cx sent . Cephalosporine.  * hypokalemia   Replace with IV fluids and oral, recheck.  *  Hypertension   Cont home meds.                         All the records are reviewed and case discussed with Care Management/Social Workerr. Management plans discussed with the patient, family and they are in agreement.  CODE STATUS: DNR  TOTAL TIME TAKING CARE OF THIS PATIENT: 35 minutes.     POSSIBLE D/C IN 1-2 DAYS, DEPENDING ON CLINICAL CONDITION.   Altamese Dilling M.D on 03/15/2016   Between 7am to 6pm - Pager - [phone]  After 6pm go to [email or web address] - password [email or web address]  [facility]  Office  [phone]  CC: Primary care physician; Emogene Morgan, MD  Note: This dictation was prepared with Dragon dictation along with smaller phrase technology. Any transcriptional errors that result from this process are unintentional.

## 2016-03-15 NOTE — NC FL2 (Signed)
[place] MEDICAID FL2 LEVEL OF CARE SCREENING TOOL     IDENTIFICATION  Patient Name: Sylvia Black Birthdate: 06-Sep-1945 Sex: female Admission Date (Current Location): 03/14/2016  [place] and [place] Number:  Chiropodist and Address:  [facility], [address], [place], [place] [address]      Provider Number: [id number]  Attending Physician Name and Address:  Altamese Dilling, MD  Relative Name and Phone Number:       Current Level of Care: Hospital Recommended Level of Care: Skilled Nursing Facility Prior Approval Number:    Date Approved/Denied:   PASRR Number: [id number]  Discharge Plan: SNF    Current Diagnoses: Patient Active Problem List   Diagnosis Date Noted  . Pressure injury of skin 03/15/2016  . AKI (acute kidney injury) (HCC) 03/14/2016  . HLD (hyperlipidemia) 01/20/2016  . GERD (gastroesophageal reflux disease) 01/20/2016  . Anxiety 01/20/2016  . DKA (diabetic ketoacidoses) (HCC) 01/20/2016  . Depression 07/04/2015  . Altered mental status   . Diabetic ketoacidosis without coma associated with type 2 diabetes mellitus (HCC)   . Encounter for central line placement   . Acidosis 07/01/2015  . Stroke (HCC)   . Hypertension   . Frequent UTI   . Diabetes mellitus without complication (HCC)   . Hypoglycemia 02/21/2013    Orientation RESPIRATION BLADDER Height & Weight     Self, Time, Situation, Place  Normal Continent Weight: 162 lb 4.1 oz (73.6 kg) Height:  5\' 3"  (160 cm)  BEHAVIORAL SYMPTOMS/MOOD NEUROLOGICAL BOWEL NUTRITION STATUS   (none)  (none) Continent Diet (dysphagia 3)  AMBULATORY STATUS COMMUNICATION OF NEEDS Skin     Verbally PU Stage and Appropriate Care                       Personal Care Assistance Level of Assistance              Functional Limitations Info  Sight, Hearing Sight Info: Impaired Hearing Info: Impaired      SPECIAL CARE FACTORS FREQUENCY  PT (By licensed PT)                     Contractures Contractures Info: Not present    Additional Factors Info  Code Status, Allergies Code Status Info: dnr Allergies Info: cipro, latex, pcns, sulfa abx           Current Medications (03/15/2016):  This is the current hospital active medication list Current Facility-Administered Medications  Medication Dose Route Frequency Provider Last Rate Last Dose  . 0.9 % NaCl with KCl 40 mEq / L  infusion   Intravenous Continuous Altamese Dilling, MD 75 mL/hr at 03/15/16 0945 75 mL/hr at 03/15/16 0945  . cefTRIAXone (ROCEPHIN) 1 g in dextrose 5 % 50 mL IVPB  1 g Intravenous q1800 Altamese Dilling, MD      . enoxaparin (LOVENOX) injection 40 mg  40 mg Subcutaneous Q24H Oralia Manis, MD      . insulin aspart (novoLOG) injection 0-15 Units  0-15 Units Subcutaneous TID WC Arnaldo Natal, MD   2 Units at 03/15/16 1338  . insulin aspart (novoLOG) injection 4 Units  4 Units Subcutaneous TID WC Arnaldo Natal, MD      . insulin glargine (LANTUS) injection 14 Units  14 Units Subcutaneous Q24H Arnaldo Natal, MD   14 Units at 03/15/16 1000  . insulin regular (NOVOLIN R,HUMULIN R) 250 Units in sodium chloride 0.9 % 250  mL (1 Units/mL) infusion   Intravenous Continuous Oralia Manisavid Willis, MD   Stopped at 03/15/16 0830  . potassium chloride (KLOR-CON) packet 40 mEq  40 mEq Oral BID Altamese DillingVaibhavkumar Vachhani, MD   40 mEq at 03/15/16 1013     Discharge Medications: Please see discharge summary for a list of discharge medications.  Relevant Imaging Results:  Relevant Lab Results:   Additional Information ss: [id number]  York SpanielMonica Alenah Sarria, LCSW

## 2016-03-15 NOTE — Consult Note (Signed)
WOC Nurse wound consult note Reason for Consult: Moisture Associated Skin Damage to bilateral buttocks, present on admission.  Currently treated for UTI.  Wound type:MASD  Pressure Injury POA: Yes pressure and moisture Measurement: 2 cm x 2 cm x 0.2 cm bilateral buttocks  Pressure and moisture.   Wound ZOX:WRUEbed:pink and moist  Dark, maroon center Drainage (amount, consistency, odor) scant serous drainage Periwound:blanchable erythema Dressing procedure/placement/frequency: Cleanse buttocks with soap and water and pat dry.  Apply barrier cream twice daily.  Avoid disposable briefs.  Turn and reposition every two hours.  Will not follow at this time.  Please re-consult if needed.  Maple HudsonKaren Candice Lunney RN BSN CWON Pager [phone]

## 2016-03-15 NOTE — Progress Notes (Signed)
Dr Elisabeth PigeonVachhani made aware of pt Sylvia Black+of 2.5. Orders for off unit telemetry and increase fluids NS w/40K to 13425ml/hr.

## 2016-03-15 NOTE — Progress Notes (Signed)
Assumed care of patient at this time. Alert and oriented to self, place and situation. States no pain at this time. Skin check performed by two nurses. IV fluids running at 12125ml/hr. Pharmacy called about missing dose of antibiotic. Awaiting medication. Bed alarm on. Bed in low position. Will continue to monitor.

## 2016-03-15 NOTE — Progress Notes (Signed)
Pharmacy Antibiotic Follow-up Note  Sylvia Black is a 71 y.o. year-old female admitted on 03/14/2016.  The patient is currently on day 2  of ciprofloxacin for UTI.  Assessment/Plan: After discussion with Dr. Elisabeth PigeonVachhani, will discontinue ciprofloxacin as indication is UTI and patient tolerated cephalexin recently. Will change abx to ceftriaxone 1 g iv q 24 hours.   Temp (24hrs), Avg:97.9 F (36.6 C), Min:97.4 F (36.3 C), Max:98.6 F (37 C)   Recent Labs Lab 03/14/16 1724  WBC 9.8    Recent Labs Lab 03/14/16 1658 03/15/16 0303 03/15/16 0800 03/15/16 1018  CREATININE 1.19* 0.82 0.53 0.54   Estimated Creatinine Clearance: 64.7 mL/min (by C-G formula based on SCr of 0.54 mg/dL).    Allergies  Allergen Reactions  . Ciprofloxacin     Other reaction(s): Hallucinations  . Latex   . Penicillins Hives    Has patient had a PCN reaction causing immediate rash, facial/tongue/throat swelling, SOB or lightheadedness with hypotension: Yes Has patient had a PCN reaction causing severe rash involving mucus membranes or skin necrosis: No Has patient had a PCN reaction that required hospitalization Yes Has patient had a PCN reaction occurring within the last 10 years: No If all of the above answers are "NO", then may proceed with Cephalosporin use.  . Sulfa Antibiotics     "hives"  . Tetanus Toxoids     "I had a strong reaction".    Antimicrobials this admission: ciprofloxacin 2/22 >> 2/23 Ceftriaxone 2/23 >>   Levels/dose changes this admission:   Microbiology results: 2/23 C diff: antigen positive, toxin negative, PCR negative UCx: pending  2/22 MRSA PCR: negative  Thank you for allowing pharmacy to be a part of this patient's care.  Luisa HartChristy, Sweetie Giebler D PharmD 03/15/2016 2:03 PM

## 2016-03-15 NOTE — Progress Notes (Signed)
Pt has remained alert and oriented to self, situation, and place. Pt has remained in NSR on cardiac monitor. BP/HR WNL. Lung sounds clear to auscultation. SpO2 100% on RA. Pt is bedridden at home with daughter. Pt with 2 documented stage II pressure ulcers to her bilateral R & L buttocks. Pts daughter request to speak with a Child psychotherapistsocial worker. Pt daughter stated, "it is just too much for me to handle at home anymore." SW-Monica has been made aware.  Pt has a poor desire to eat or drink. Intake has been minimal since insulin gtt was discontinued. Pt does not have any teeth-DYS III diet ordered.  Pt with orders to transfer to the floor.

## 2016-03-15 NOTE — Progress Notes (Signed)
PT Cancellation Note  Patient Details Name: Philippa Chestereresa P Nuccio MRN: [id number] DOB: 11/01/1945   Cancelled Treatment:    Reason Eval/Treat Not Completed: Medical issues which prohibited therapy; Pt's potassium 2.6 down from 3.1 earlier this date, will hold pt from PT services and will attempt to evaluate pt at a future date/time as medically appropriate.   Thomes Dinningavid Scott Huong Luthi 03/15/2016, 11:20 AM

## 2016-03-16 LAB — LIPASE, BLOOD: LIPASE: 16 U/L (ref 11–51)

## 2016-03-16 LAB — URINE CULTURE: Culture: >=100000 — AB

## 2016-03-16 LAB — BASIC METABOLIC PANEL
Anion gap: 11 (ref 5–15)
BUN: 16 mg/dL (ref 6–20)
CO2: 23 mmol/L (ref 22–32)
Calcium: 7.1 mg/dL — ABNORMAL LOW (ref 8.9–10.3)
Chloride: 105 mmol/L (ref 101–111)
Creatinine, Ser: 0.65 mg/dL (ref 0.44–1.00)
GFR calc Af Amer: >60 mL/min (ref >60)
GFR calc non Af Amer: >60 mL/min (ref >60)
Glucose, Bld: 103 mg/dL — ABNORMAL HIGH (ref 65–99)
Potassium: 4.2 mmol/L (ref 3.5–5.1)
Sodium: 139 mmol/L (ref 135–145)

## 2016-03-16 LAB — GLUCOSE, CAPILLARY
GLUCOSE-CAPILLARY: 113 mg/dL — AB (ref 65–99)
GLUCOSE-CAPILLARY: 59 mg/dL — AB (ref 65–99)
Glucose-Capillary: 104 mg/dL — ABNORMAL HIGH (ref 65–99)
Glucose-Capillary: 99 mg/dL (ref 65–99)
Glucose-Capillary: 57 mg/dL — ABNORMAL LOW (ref 65–99)

## 2016-03-16 LAB — TSH: TSH: 1.579 u[IU]/mL (ref 0.350–4.500)

## 2016-03-16 LAB — LACTIC ACID, PLASMA: Lactic Acid, Venous: 1.4 mmol/L (ref 0.5–1.9)

## 2016-03-16 LAB — ALBUMIN: ALBUMIN: 1.8 g/dL — AB (ref 3.5–5.0)

## 2016-03-16 LAB — MAGNESIUM: Magnesium: 1.1 mg/dL — ABNORMAL LOW (ref 1.7–2.4)

## 2016-03-16 MED ORDER — TIZANIDINE HCL 4 MG PO TABS
4.0000 mg | ORAL_TABLET | Freq: Two times a day (BID) | ORAL | Status: DC
  Administered 2016-03-16 – 2016-03-20 (×7): 4 mg via ORAL
  Filled 2016-03-16 (×7): qty 1

## 2016-03-16 MED ORDER — MAGNESIUM SULFATE 4 GM/100ML IV SOLN
4.0000 g | Freq: Once | INTRAVENOUS | Status: AC
  Administered 2016-03-16: 4 g via INTRAVENOUS
  Filled 2016-03-16: qty 100

## 2016-03-16 MED ORDER — BUPROPION HCL ER (XL) 150 MG PO TB24
150.0000 mg | ORAL_TABLET | Freq: Two times a day (BID) | ORAL | Status: DC
  Administered 2016-03-16 – 2016-03-18 (×3): 150 mg via ORAL
  Filled 2016-03-16 (×4): qty 1

## 2016-03-16 MED ORDER — METOCLOPRAMIDE HCL 5 MG/ML IJ SOLN
5.0000 mg | Freq: Three times a day (TID) | INTRAMUSCULAR | Status: DC
  Administered 2016-03-16 – 2016-03-18 (×6): 5 mg via INTRAVENOUS
  Filled 2016-03-16 (×6): qty 2

## 2016-03-16 MED ORDER — MAGNESIUM OXIDE 400 (241.3 MG) MG PO TABS
400.0000 mg | ORAL_TABLET | Freq: Every day | ORAL | Status: DC
  Administered 2016-03-16 – 2016-03-20 (×4): 400 mg via ORAL
  Filled 2016-03-16 (×5): qty 1

## 2016-03-16 MED ORDER — METOCLOPRAMIDE HCL 10 MG PO TABS
10.0000 mg | ORAL_TABLET | Freq: Three times a day (TID) | ORAL | Status: DC
  Administered 2016-03-16: 10 mg via ORAL
  Filled 2016-03-16: qty 1

## 2016-03-16 MED ORDER — POTASSIUM CHLORIDE IN NACL 20-0.9 MEQ/L-% IV SOLN
INTRAVENOUS | Status: DC
  Administered 2016-03-16 – 2016-03-18 (×3): via INTRAVENOUS
  Filled 2016-03-16 (×6): qty 1000

## 2016-03-16 MED ORDER — ONDANSETRON HCL 4 MG/2ML IJ SOLN
4.0000 mg | Freq: Four times a day (QID) | INTRAMUSCULAR | Status: DC | PRN
  Administered 2016-03-16 – 2016-03-17 (×2): 4 mg via INTRAVENOUS
  Filled 2016-03-16 (×2): qty 2

## 2016-03-16 NOTE — Progress Notes (Signed)
Spoke with MD regarding C. Diff. Results. Positive for antigen, negative for toxin. No need for enteric precautions at this time, check with infection control to verify.

## 2016-03-16 NOTE — Progress Notes (Signed)
[employer] - [place] at [facility]   PATIENT NAME: Avrielle Fry    MR#:  [id number]  DATE OF BIRTH:  12/15/1945  SUBJECTIVE:  CHIEF COMPLAINT:   Chief Complaint  Patient presents with  . Hyperglycemia   Admitted with DKA, found to have UTI.  DKA has resolved  Has nausea which is chronic Flat affect. Feels weak.  1 episode vomiting  REVIEW OF SYSTEMS:  CONSTITUTIONAL: No fever, fatigue or weakness.  EYES: No blurred or double vision.  EARS, NOSE, AND THROAT: No tinnitus or ear pain.  RESPIRATORY: No cough, shortness of breath, wheezing or hemoptysis.  CARDIOVASCULAR: No chest pain, orthopnea, edema.  GASTROINTESTINAL: Positive nausea, vomiting. NO abd pain GENITOURINARY: No dysuria, hematuria.  ENDOCRINE: No polyuria, nocturia,  HEMATOLOGY: No anemia, easy bruising or bleeding SKIN: No rash or lesion. MUSCULOSKELETAL: No joint pain or arthritis.   NEUROLOGIC: No tingling, numbness, weakness.  PSYCHIATRY: No anxiety or depression.   ROS  DRUG ALLERGIES:   Allergies  Allergen Reactions  . Ciprofloxacin     Other reaction(s): Hallucinations  . Latex   . Penicillins Hives    Has patient had a PCN reaction causing immediate rash, facial/tongue/throat swelling, SOB or lightheadedness with hypotension: Yes Has patient had a PCN reaction causing severe rash involving mucus membranes or skin necrosis: No Has patient had a PCN reaction that required hospitalization Yes Has patient had a PCN reaction occurring within the last 10 years: No If all of the above answers are "NO", then may proceed with Cephalosporin use.  . Sulfa Antibiotics     "hives"  . Tetanus Toxoids     "I had a strong reaction".    VITALS:  Blood pressure (!) 144/64, pulse (!) 118, temperature 97.8 F (36.6 C), temperature source Oral, resp. rate 12, height 5\' 3"  (1.6 m), weight 73.6 kg (162 lb 4.1 oz), SpO2 100 %.  PHYSICAL EXAMINATION:  GENERAL:  71 y.o.-year-old patient lying in  the bed with no acute distress.  EYES: Pupils equal, round, reactive to light and accommodation. No scleral icterus. Extraocular muscles intact.  HEENT: Head atraumatic, normocephalic. Oropharynx and nasopharynx clear.  NECK:  Supple, no jugular venous distention. No thyroid enlargement, no tenderness.  LUNGS: Normal breath sounds bilaterally, no wheezing, rales,rhonchi or crepitation. No use of accessory muscles of respiration.  CARDIOVASCULAR: S1, S2 normal. No murmurs, rubs, or gallops.  ABDOMEN: Soft, nontender, nondistended. Bowel sounds present. No organomegaly or mass.  EXTREMITIES: edema in all extremities. Bruising NEUROLOGIC: Cranial nerves II through XII are intact. Motor 1/5 in lower extremities and 3/5 in upper extremities Sensation intact. Gait not checked.  PSYCHIATRIC: The patient is alert and awake. Flat affect SKIN: No obvious rash, lesion, or ulcer.   Physical Exam LABORATORY PANEL:   CBC  Recent Labs Lab 03/14/16 1724  WBC 9.8  HGB 8.4*  HCT 25.9*  PLT 419   ------------------------------------------------------------------------------------------------------------------  Chemistries   Recent Labs Lab 03/16/16 0345  NA 139  K 4.2  CL 105  CO2 23  GLUCOSE 103*  BUN 16  CREATININE 0.65  CALCIUM 7.1*  MG 1.1*   ------------------------------------------------------------------------------------------------------------------  Cardiac Enzymes No results for input(s): TROPONINI in the last 168 hours. ------------------------------------------------------------------------------------------------------------------  RADIOLOGY:  Dg Chest Port 1 View  Result Date: 03/14/2016 CLINICAL DATA:  Weakness and SOB x 2 days, strong urine odor EXAM: PORTABLE CHEST - 1 VIEW COMPARISON:  01/20/2016 FINDINGS: Lungs are clear. Heart size and mediastinal contours are within normal limits. No effusion.  Cervical fixation hardware again noted. IMPRESSION: No acute  cardiopulmonary disease. Electronically Signed   By: Corlis Leak  Hassell M.D.   On: 03/14/2016 18:55    ASSESSMENT AND PLAN:   Principal Problem:   Diabetic ketoacidosis without coma associated with type 2 diabetes mellitus (HCC) Active Problems:   Hypertension   Frequent UTI   HLD (hyperlipidemia)   GERD (gastroesophageal reflux disease)   Anxiety   DKA (diabetic ketoacidoses) (HCC)   AKI (acute kidney injury) (HCC)   Pressure injury of skin   * DKA, resolved On lantus and SSI. BS well controlled.  * UTI   Ur cx pending. On ceftriaxone.  * Hypokalemia  Replaced and now normal  *  Hypertension   Cont home meds.  * Hypomagnesemia Repalce thru IV        * Gastroparesis Start home dose PO reglan             All the records are reviewed and case discussed with Care Management/Social Worker Management plans discussed with the patient, family and they are in agreement.  CODE STATUS: DNR  TOTAL TIME TAKING CARE OF THIS PATIENT: 35 minutes.   POSSIBLE D/C IN 1-2 DAYS, DEPENDING ON CLINICAL CONDITION.  Milagros LollSudini, Sandrina Heaton R M.D on 03/16/2016   Between 7am to 6pm - Pager - [phone]  After 6pm go to [email or web address] - password [facility]  [facility] [place] [facility]  Office  [phone]  CC: Primary care physician; Emogene MorganAYCOCK, NGWE A, MD  Note: This dictation was prepared with Dragon dictation along with smaller phrase technology. Any transcriptional errors that result from this process are unintentional.

## 2016-03-16 NOTE — Progress Notes (Addendum)
Daughter called requests update. MD paged, notified of elevated BP, pt having repeated episodes (x3) of vomiting. New orders to follow. MD to contact daughter.

## 2016-03-16 NOTE — Progress Notes (Signed)
PT Cancellation Note  Patient Details Name: Sylvia Black MRN: [id number] DOB: 08/31/1945   Cancelled Treatment:    Reason Eval/Treat Not Completed: PT screened, no needs identified, will sign off. Chart reviewed. Per chart pt is bedbound and dependent care at baseline. Family uses a Nurse, adultHoyer lift for all transfers. Went to speak with patient and she is unable to recall when she last had physical therapy. She confirms that she is dependent for all activities. Pt unable to move UE/LE on command. Does not appear to have any needs which physical therapy can address. Family should perform positioning and PROM for pain control and skin integrity. Attempted to contact patient's daughter Sylvia Black via telephone but no response. Will complete current order as pt does not appear to have any PT needs. Please enter new consult if status or needs change.   Sharalyn InkJason D Slate Debroux PT, DPT   Sylvia Black 03/16/2016, 11:22 AM

## 2016-03-16 NOTE — Progress Notes (Signed)
pts fingerstick was 57, asymptomatic, gave 2 cups of orange juice, will recheck

## 2016-03-16 NOTE — Progress Notes (Signed)
Skin tear noted to right wrist. Foam dressing applied. Noted skin tear with clear fluid leakage to back of left hand. Non-stick dressing with gauze applied. Elevated both arms on pillows due to severe edema.

## 2016-03-17 ENCOUNTER — Inpatient Hospital Stay: Payer: Medicare HMO

## 2016-03-17 ENCOUNTER — Encounter: Payer: Self-pay | Admitting: Radiology

## 2016-03-17 DIAGNOSIS — E1310 Other specified diabetes mellitus with ketoacidosis without coma: Secondary | ICD-10-CM

## 2016-03-17 LAB — GLUCOSE, CAPILLARY
GLUCOSE-CAPILLARY: 132 mg/dL — AB (ref 65–99)
GLUCOSE-CAPILLARY: 59 mg/dL — AB (ref 65–99)
GLUCOSE-CAPILLARY: 72 mg/dL (ref 65–99)
GLUCOSE-CAPILLARY: 91 mg/dL (ref 65–99)
Glucose-Capillary: 84 mg/dL (ref 65–99)
Glucose-Capillary: 55 mg/dL — ABNORMAL LOW (ref 65–99)
Glucose-Capillary: 68 mg/dL (ref 65–99)
Glucose-Capillary: 77 mg/dL (ref 65–99)

## 2016-03-17 LAB — BASIC METABOLIC PANEL
Anion gap: 6 (ref 5–15)
BUN: 14 mg/dL (ref 6–20)
CO2: 26 mmol/L (ref 22–32)
Calcium: 7.1 mg/dL — ABNORMAL LOW (ref 8.9–10.3)
Chloride: 109 mmol/L (ref 101–111)
Creatinine, Ser: 0.72 mg/dL (ref 0.44–1.00)
GFR calc Af Amer: >60 mL/min (ref >60)
GFR calc non Af Amer: >60 mL/min (ref >60)
Glucose, Bld: 123 mg/dL — ABNORMAL HIGH (ref 65–99)
Potassium: 4.1 mmol/L (ref 3.5–5.1)
Sodium: 141 mmol/L (ref 135–145)

## 2016-03-17 LAB — MAGNESIUM: Magnesium: 2.3 mg/dL (ref 1.7–2.4)

## 2016-03-17 MED ORDER — INSULIN GLARGINE 100 UNIT/ML ~~LOC~~ SOLN
11.0000 [IU] | SUBCUTANEOUS | Status: DC
  Administered 2016-03-17 – 2016-03-18 (×2): 11 [IU] via SUBCUTANEOUS
  Filled 2016-03-17 (×2): qty 0.11

## 2016-03-17 MED ORDER — ZINC OXIDE 40 % EX OINT
TOPICAL_OINTMENT | CUTANEOUS | Status: DC | PRN
  Filled 2016-03-17: qty 57

## 2016-03-17 MED ORDER — IOPAMIDOL (ISOVUE-300) INJECTION 61%
15.0000 mL | INTRAVENOUS | Status: AC
  Administered 2016-03-17: 15 mL via ORAL

## 2016-03-17 MED ORDER — MIRTAZAPINE 15 MG PO TBDP
15.0000 mg | ORAL_TABLET | Freq: Every day | ORAL | Status: DC
  Administered 2016-03-17 – 2016-03-19 (×3): 15 mg via ORAL
  Filled 2016-03-17 (×4): qty 1

## 2016-03-17 MED ORDER — ALBUMIN HUMAN 25 % IV SOLN
25.0000 g | Freq: Three times a day (TID) | INTRAVENOUS | Status: DC
  Administered 2016-03-17 – 2016-03-18 (×4): 25 g via INTRAVENOUS
  Filled 2016-03-17 (×6): qty 100

## 2016-03-17 MED ORDER — IOPAMIDOL (ISOVUE-300) INJECTION 61%
100.0000 mL | Freq: Once | INTRAVENOUS | Status: AC | PRN
  Administered 2016-03-17: 100 mL via INTRAVENOUS

## 2016-03-17 MED ORDER — FLUCONAZOLE 100 MG PO TABS
100.0000 mg | ORAL_TABLET | Freq: Every day | ORAL | Status: DC
  Administered 2016-03-17 – 2016-03-20 (×4): 100 mg via ORAL
  Filled 2016-03-17 (×4): qty 1

## 2016-03-17 MED ORDER — GLUCERNA SHAKE PO LIQD
237.0000 mL | Freq: Three times a day (TID) | ORAL | Status: DC
  Administered 2016-03-18 – 2016-03-20 (×5): 237 mL via ORAL

## 2016-03-17 NOTE — Progress Notes (Signed)
Pt's daughter at bedside, requests to speak with MD. Daughter was concerned because pt not in room when she arrived, this writer informed her that pt out for procedure (CT scan). Daughter states, "I am her POA, if she leaves this room, I had better know about it!" Relayed info to MD and charge nurse. Informed daughter that notation has been made in chart.

## 2016-03-17 NOTE — Consult Note (Signed)
Midge Miniumarren Wohl, MD [facility]  [address] [place], [place] [address] Phone: [phone] Fax : [phone]  Consultation  Referring Provider:     No ref. provider found Primary Care Physician:  Emogene MorganAYCOCK, NGWE A, MD Primary Gastroenterologist:  Dr. Markham JordanElliot       Reason for Consultation:     Nausea, vomiting  Date of Admission:  03/14/2016 Date of Consultation:  03/17/2016         HPI:   Philippa Chestereresa P Nadeau is a 71 y.o. female with deconditioning from multiple medical problems as detailed below admitted with generalized weakness, malaise, nausea, mild DKA, UTI. GI was consulted for nausea and vomiting. Patient is a poor historian. She said she lives with her daughter and has been able to tolerate PO well, currently denies n/v. She had EGD/EUS in 10/2015 which showed normal esophagus, and stomach. She had several imaging studies for nausea, vomiting. She had non specific colitis secondary to sever hypoalbuminemia. She has multiple pancreatic cysts/side branch IPMNs based in EUS in 10/2015. On my interview, she looked comfortable and denied any abdominal pain, she had loose brown BM when I interviewed her.   Past Medical History:  Diagnosis Date  . Anxiety   . Bipolar disorder (HCC)   . Bullous pemphigoid   . 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    "left; see pain dr for that" (02/22/2013)  . Depression   . Family history of anesthesia complication    "daughter's w/PONV" (02/22/2013)  . Frequent UTI   . GERD (gastroesophageal reflux disease)   . Gout attack    "twice" (02/22/2013)  . High cholesterol   . Hypertension   . Pneumonia 1990's   "once"  . PONV (postoperative nausea and vomiting)    "makes me violently ill" (02/22/2013)  . Sleep apnea    "dx'd in the 1990's; don't wear mask" (02/22/2013)  . Stroke [facility](HCC) 2010; 02/2012   denies residual; "balance issues since & weak on left side since" (02/22/2013)  . Type II diabetes mellitus (HCC)   . Vertigo     Past Surgical History:  Procedure Laterality  Date  . ANTERIOR CERVICAL DECOMP/DISCECTOMY FUSION  1989  . APPENDECTOMY  1984  . CATARACT EXTRACTION W/ INTRAOCULAR LENS  IMPLANT, BILATERAL Bilateral ~ 2008  . CHOLECYSTECTOMY  1984  . POSTERIOR FUSION CERVICAL SPINE  ~ 1988  . TUBAL LIGATION  1973  . UPPER ESOPHAGEAL ENDOSCOPIC ULTRASOUND (EUS) N/A 11/09/2015   Procedure: UPPER ESOPHAGEAL ENDOSCOPIC ULTRASOUND (EUS);  Surgeon: Rayann HemanPaul Jowell, MD;  [facility];  Service: Endoscopy;  Laterality: N/A;  . WRIST SURGERY Right ~ 1985   "tied off damagaed nerve" (02/22/2013)    Prior to Admission medications   Medication Sig Start Date End Date Taking? Authorizing Provider  acetaminophen (TYLENOL) 325 MG tablet Take 2 tablets (650 mg total) by mouth every 6 (six) hours as needed for mild pain, fever or headache. 01/24/16  Yes Enid Baasadhika Kalisetti, MD  ammonium lactate (LAC-HYDRIN) 12 % lotion Apply twice a day to feet bilaterally 07/07/15  Yes Alford Highlandichard Wieting, MD  buPROPion (WELLBUTRIN XL) 150 MG 24 hr tablet Take 150 mg by mouth 2 (two) times daily.    Yes Historical Provider, MD  diclofenac sodium (VOLTAREN) 1 % GEL Apply topically 4 (four) times daily.   Yes Historical Provider, MD  Insulin Glargine (LANTUS) 100 UNIT/ML Solostar Pen Inject 14 Units into the skin at bedtime. 01/24/16  Yes Enid Baasadhika Kalisetti, MD  lidocaine (LIDODERM) 5 % Place 1 patch onto  the skin daily. Remove & Discard patch within 12 hours or as directed by MD   Yes Historical Provider, MD  metoCLOPramide (REGLAN) 10 MG tablet Take 1 tablet (10 mg total) by mouth 4 (four) times daily -  before meals and at bedtime. 01/24/16  Yes Enid Baas, MD  ondansetron (ZOFRAN-ODT) 4 MG disintegrating tablet Take 4 mg by mouth 3 (three) times daily as needed. 03/06/16  Yes Historical Provider, MD  tiZANidine (ZANAFLEX) 4 MG tablet Take 4 mg by mouth 2 (two) times daily. 12/30/14  Yes Historical Provider, MD  atorvastatin (LIPITOR) 40 MG tablet Take 1 tablet (40 mg total) by mouth daily  at 6 PM. Patient not taking: Reported on 03/14/2016 07/07/15   Alford Highland, MD  clopidogrel (PLAVIX) 75 MG tablet Take 1 tablet (75 mg total) by mouth daily. Patient not taking: Reported on 03/14/2016 07/07/15   Alford Highland, MD  insulin aspart (NOVOLOG) 100 UNIT/ML FlexPen Inject 4 Units into the skin 3 (three) times daily with meals. Patient not taking: Reported on 03/14/2016 08/09/15   Enid Baas, MD  Insulin Pen Needle (CARETOUCH PEN NEEDLES) 32G X 4 MM MISC Use with flex pens for insulin 08/09/15   Enid Baas, MD  phosphorus (K PHOS NEUTRAL) 161-096-045 MG tablet Take 2 tablets (500 mg total) by mouth 4 (four) times daily. Patient not taking: Reported on 03/14/2016 01/24/16   Katha Hamming, MD  predniSONE (DELTASONE) 5 MG tablet Take 1 tablet (5 mg total) by mouth daily with breakfast. Patient not taking: Reported on 03/14/2016 07/07/15   Alford Highland, MD    Family History  Problem Relation Age of Onset  . Heart disease Mother   . Diabetes Mother   . Heart disease Father      Social History  Substance Use Topics  . Smoking status: Passive Smoke Exposure - Never Smoker  . Smokeless tobacco: Never Used  . Alcohol use Yes     Comment: 02/22/2013 "mixed drink on special occasions; glass of wine q 2-3 months"    Allergies as of 03/14/2016 - Review Complete 03/14/2016  Allergen Reaction Noted  . Ciprofloxacin  07/01/2015  . Latex  11/09/2015  . Penicillins Hives 02/21/2013  . Sulfa antibiotics  02/21/2013  . Tetanus toxoids  02/21/2013    Review of Systems:    All systems reviewed and negative except where noted in HPI.   Physical Exam:  Vital signs in last 24 hours: Temp:  [98.2 F (36.8 C)-98.6 F (37 C)] 98.4 F (36.9 C) (02/25 1157) Pulse Rate:  [106-116] 114 (02/25 1303) Resp:  [16-18] 18 (02/25 1157) BP: (112-160)/(37-84) 159/76 (02/25 1303) SpO2:  [97 %-100 %] 98 % (02/25 1303) Last BM Date: 03/16/15 General:   Lying in bed, comfortable,  anasarca Head:  Normocephalic and atraumatic. Eyes:   No icterus.   Conjunctiva pink. PERRLA. Ears:  Normal auditory acuity. Neck:  Supple; no masses or thyroidomegaly Lungs: Respirations even and unlabored. Lungs clear to auscultation bilaterally.   No wheezes, crackles, or rhonchi.  Heart:  Regular rate and rhythm;  Without murmur, clicks, rubs or gallops Abdomen:  Soft, nondistended, nontender. Normal bowel sounds. No appreciable masses or hepatomegaly.  No rebound or guarding.  Rectal:  Not performed. Msk:  Symmetrical without gross deformities. Extremities:  Without edema, cyanosis or clubbing. Neurologic:  Alert and oriented x3;  grossly normal neurologically. Skin:  Open wounds with dressing in place in hands and feet, large purpurea in bilateral UE and LE Psych:  Alert  and cooperative. Normal affect.  LAB RESULTS:  Recent Labs  03/14/16 1724  WBC 9.8  HGB 8.4*  HCT 25.9*  PLT 419   BMET  Recent Labs  03/15/16 1431 03/16/16 0345 03/17/16 0334  NA 137 139 141  K 2.5* 4.2 4.1  CL 99* 105 109  CO2 25 23 26   GLUCOSE 111* 103* 123*  BUN 17 16 14   CREATININE 0.66 0.65 0.72  CALCIUM 7.1* 7.1* 7.1*   LFT  Recent Labs  03/16/16 1408  ALBUMIN 1.8*   PT/INR No results for input(s): LABPROT, INR in the last 72 hours.  STUDIES: No results found.    Impression / Plan:   AHNYA AKRE is a 71 y.o. y/o female with mutiple medical problems consulted for nausea/vomiting. She had normal EGD in 10/2015 and there is no evidence of gastric outlet obstruction. Her nausea/vomiting is multifactorial, infection+ recurrent DKA, uncontrolled DM which all can lead to delayed gastric emptying. Recommend symptomatic management, small frequent meals, anti emetics as needed. No indication for EGD at this time  Thank you for involving me in the care of this patient.      LOS: 3 days   Lannette Donath, MD  03/17/2016, 1:52 PM

## 2016-03-17 NOTE — Progress Notes (Signed)
Pt noted with reddened, excoriated areas to buttocks and vaginal area. Foam dressing changed/applied for stg 2 ulcers (see flowsheet). Pt having frequent loose stools, incontinent of bowel and bladder. Frequent cleaning and turning of patient every 1-2 hours. Zinc oxide paste ordered for skin protection. Will continue to monitor. Oncoming shift notified.

## 2016-03-17 NOTE — Progress Notes (Signed)
BG increased to 68, and 77 after meal. Pt ate about 25% of meal, 2-3 bites of each.

## 2016-03-17 NOTE — Progress Notes (Signed)
[employer] - [place] at [facility]   PATIENT NAME: Sylvia Black    MR#:  [id number]  DATE OF BIRTH:  [birth year]  SUBJECTIVE:  CHIEF COMPLAINT:   Chief Complaint  Patient presents with  . Hyperglycemia   Admitted with DKA, found to have UTI.  DKA has resolved  Continues to have nausea. Flat affect. Poor historian. Her sugars dropped into the 50s overnight due to poor oral intake.  REVIEW OF SYSTEMS:    Review of Systems  Unable to perform ROS: Dementia    DRUG ALLERGIES:   Allergies  Allergen Reactions  . Ciprofloxacin     Other reaction(s): Hallucinations  . Latex   . Penicillins Hives    Has patient had a PCN reaction causing immediate rash, facial/tongue/throat swelling, SOB or lightheadedness with hypotension: Yes Has patient had a PCN reaction causing severe rash involving mucus membranes or skin necrosis: No Has patient had a PCN reaction that required hospitalization Yes Has patient had a PCN reaction occurring within the last 10 years: No If all of the above answers are "NO", then may proceed with Cephalosporin use.  . Sulfa Antibiotics     "hives"  . Tetanus Toxoids     "I had a strong reaction".    VITALS:  Blood pressure (!) 160/84, pulse (!) 114, temperature 98.4 F (36.9 C), temperature source Oral, resp. rate 16, height 5\' 3"  (1.6 m), weight 73.6 kg (162 lb 4.1 oz), SpO2 100 %.  PHYSICAL EXAMINATION:  GENERAL:  71 y.o.-year-old patient lying in the bed with no acute distress.  EYES: Pupils equal, round, reactive to light and accommodation. No scleral icterus. Extraocular muscles intact.  HEENT: Head atraumatic, normocephalic. Oropharynx and nasopharynx clear.  NECK:  Supple, no jugular venous distention. No thyroid enlargement, no tenderness.  LUNGS: Normal breath sounds bilaterally, no wheezing, rales,rhonchi or crepitation. No use of accessory muscles of respiration.  CARDIOVASCULAR: S1, S2 normal. No murmurs, rubs, or gallops.   ABDOMEN: Soft, nontender, nondistended. Bowel sounds present. No organomegaly or mass.  EXTREMITIES: edema in all extremities. Bruising NEUROLOGIC: Cranial nerves II through XII are intact. Motor 1/5 in lower extremities and 3/5 in upper extremities Sensation intact. Gait not checked.  PSYCHIATRIC: The patient is alert and awake. Flat affect SKIN: No obvious rash, lesion, or ulcer.   Physical Exam LABORATORY PANEL:   CBC  Recent Labs Lab 03/14/16 1724  WBC 9.8  HGB 8.4*  HCT 25.9*  PLT 419   ------------------------------------------------------------------------------------------------------------------  Chemistries   Recent Labs Lab 03/17/16 0334  NA 141  K 4.1  CL 109  CO2 26  GLUCOSE 123*  BUN 14  CREATININE 0.72  CALCIUM 7.1*  MG 2.3   ------------------------------------------------------------------------------------------------------------------  Cardiac Enzymes No results for input(s): TROPONINI in the last 168 hours. ------------------------------------------------------------------------------------------------------------------  RADIOLOGY:  No results found.  ASSESSMENT AND PLAN:   Principal Problem:   Diabetic ketoacidosis without coma associated with type 2 diabetes mellitus (HCC) Active Problems:   Hypertension   Frequent UTI   HLD (hyperlipidemia)   GERD (gastroesophageal reflux disease)   Anxiety   DKA (diabetic ketoacidoses) (HCC)   AKI (acute kidney injury) (HCC)   Pressure injury of skin  * chronic vomiting likely gastroparesis On IV Reglan. Discussed with GI. Schedule for EGD.  * DKA, resolved Uncontrolled diabetes with hypo-and hyperglycemic episodes On lantus and SSI. Reduced dose of Lantus. Mealtime insulin only. Patient eats more than 50% meal.   * UTI Yeast in the urine. Start fluconazole.  Stop ceftriaxone.  * Hypokalemia  Replaced and now normal  *  Hypertension   Cont home meds.  * Hypomagnesemia Repalced     * uterine/cervical mass on CT scan Repeat CT scan for follow-up. Depending on the results will likely need gynecology follow-up to rule out malignancy.  * severe depression Consult psychiatry  *  Anasarca with severe hypoalbuminemia due to severe protein and calorie malnutrition Dietary consult. Start Glucerna. Albumin IV.            All the records are reviewed and case discussed with Care Management/Social Worker Management plans discussed with the patient, family and they are in agreement.  CODE STATUS: DNR  TOTAL TIME TAKING CARE OF THIS PATIENT: 35 minutes.   POSSIBLE D/C IN 1-2 DAYS, DEPENDING ON CLINICAL CONDITION.  Milagros LollSudini, Cambreigh Dearing R M.D on 03/17/2016   Between 7am to 6pm - Pager - [phone]  After 6pm go to [email or web address] - password [email or web address]  [facility] [place] [facility]  Office  [phone]  CC: Primary care physician; Emogene MorganAYCOCK, NGWE A, MD  Note: This dictation was prepared with Dragon dictation along with smaller phrase technology. Any transcriptional errors that result from this process are unintentional.

## 2016-03-17 NOTE — Consult Note (Signed)
[facility] Face-to-Face Psychiatry Consult   Reason for Consult:  Depression. Referring Physician:  Dr. Darvin Neighbours Patient Identification: Sylvia Black MRN:  [id number] Principal Diagnosis: Diabetic ketoacidosis without coma associated with type 2 diabetes mellitus [facility]) Diagnosis:   Patient Active Problem List   Diagnosis Date Noted  . Pressure injury of skin [L89.90] 03/15/2016  . AKI (acute kidney injury) ([place]) [N17.9] 03/14/2016  . HLD (hyperlipidemia) [E78.5] 01/20/2016  . GERD (gastroesophageal reflux disease) [K21.9] 01/20/2016  . Anxiety [F41.9] 01/20/2016  . DKA (diabetic ketoacidoses) ([place]) [E13.10] 01/20/2016  . 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 Altered mental status [R41.82]   . Diabetic ketoacidosis without coma associated with type 2 diabetes mellitus ([place]) [E13.10]   . Encounter for central line placement [Z45.2]   . Acidosis [E87.2] 07/01/2015  . Stroke [facility]) [I63.9]   . Hypertension [I10]   . Frequent UTI [N39.0]   . Diabetes mellitus without complication ([place]) [L85.9]   . Hypoglycemia [E16.2] 02/21/2013    Total Time spent with patient: 45 minutes  Subjective:    Identifying data. Sylvia Black is a 71 y.o. female with a history of depression and multiple medical problems.  Chief complaint. "I am better."  History of present illness. Information was obtained from the patient and the chart. Apparently the patient has a history of depression that has been maintained with Wellbutrin prescribed by her primary care provider. The patient was admitted to medical floor for DKA, UTI, and poor oral intake. She is bedbound. My understanding is that the family is no longer able to take care of this patient and placement in a nursing home is being attempted. There is some thinking that her debility may be related to depression. The patient is very pleasant and polite on interview. She reports that she is feeling a little better today. She  initially denies any symptoms of depression, anxiety, or psychosis but admits that she feels sad "a little" and that she cries "a little". She reports poor sleep and absolutely no appetite. Her daughter believes that the mother is stubborn and refuses to cooperate. The patient was seen in consultation by Dr. Weber Cooks in June 2017. At that time the patient denied depressive symptoms and was continued on Wellbutrin. I spoke with the patient about the plan to place her in a nursing facility. She seems to be aware of it and accepting. She has very limited social interaction. She has had a very hard time adjusting to her widowhood after her husband passed away 5 years ago. She has no interest in any activities, not even watching TV. She never gets out of the house except for doctor's appointments as she is truly bedbound. She adamantly denies any suicidal thoughts, intention or plans. There are no alcohol or substances involved.  Past psychiatric history. She has been treated for depression by her primary providers with Wellbutrin. She was hospitalized in a mental hospital many years ago. She never attempted suicide.  Family psychiatry history. She believes that there are multiple family members with emotional problems.  Social history. For the past five years, since her husband's death, she has been living with her daughter and her grandson. This arrangement would no longer be possible and the patient will be placed in a nursing home.  Risk to Self: Is patient at risk for suicide?: No Risk to Others:   Prior Inpatient Therapy:   Prior Outpatient Therapy:    Past Medical History:  Past Medical History:  Diagnosis Date  . Anxiety   . Bipolar disorder ([place])   . Bullous pemphigoid   . 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    "left; see pain dr for that" (02/22/2013)  . Depression   . Family history of anesthesia complication    "daughter's w/PONV" (02/22/2013)  . Frequent UTI   . GERD (gastroesophageal reflux  disease)   . Gout attack    "twice" (02/22/2013)  . High cholesterol   . Hypertension   . Pneumonia 1990's   "once"  . PONV (postoperative nausea and vomiting)    "makes me violently ill" (02/22/2013)  . Sleep apnea    "dx'd in the 1990's; don't wear mask" (02/22/2013)  . Stroke [facility]) 2010; 02/2012   denies residual; "balance issues since & weak on left side since" (02/22/2013)  . Type II diabetes mellitus ([place])   . Vertigo     Past Surgical History:  Procedure Laterality Date  . ANTERIOR CERVICAL DECOMP/DISCECTOMY FUSION  1989  . APPENDECTOMY  1984  . CATARACT EXTRACTION W/ INTRAOCULAR LENS  IMPLANT, BILATERAL Bilateral ~ 2008  . CHOLECYSTECTOMY  1984  . POSTERIOR FUSION CERVICAL SPINE  ~ 1988  . TUBAL LIGATION  1973  . UPPER ESOPHAGEAL ENDOSCOPIC ULTRASOUND (EUS) N/A 11/09/2015   Procedure: UPPER ESOPHAGEAL ENDOSCOPIC ULTRASOUND (EUS);  Surgeon: Jola Schmidt, MD;  Location: [facility] ENDOSCOPY;  Service: Endoscopy;  Laterality: N/A;  . WRIST SURGERY Right ~ 1985   "tied off damagaed nerve" (02/22/2013)   Family History:  Family History  Problem Relation Age of Onset  . Heart disease Mother   . Diabetes Mother   . Heart disease Father     Social History:  History  Alcohol Use  . Yes    Comment: 02/22/2013 "mixed drink on special occasions; glass of wine q 2-3 months"     History  Drug Use No    Social History   Social History  . Marital status: Widowed    Spouse name: N/A  . Number of children: N/A  . Years of education: N/A   Social History Main Topics  . Smoking status: Passive Smoke Exposure - Never Smoker  . Smokeless tobacco: Never Used  . Alcohol use Yes     Comment: 02/22/2013 "mixed drink on special occasions; glass of wine q 2-3 months"  . Drug use: No  . Sexual activity: Not Asked   Other Topics Concern  . None   Social History Narrative  . None   Additional Social History:    Allergies:   Allergies  Allergen Reactions  . Ciprofloxacin     Other  reaction(s): Hallucinations  . Latex   . Penicillins Hives    Has patient had a PCN reaction causing immediate rash, facial/tongue/throat swelling, SOB or lightheadedness with hypotension: Yes Has patient had a PCN reaction causing severe rash involving mucus membranes or skin necrosis: No Has patient had a PCN reaction that required hospitalization Yes Has patient had a PCN reaction occurring within the last 10 years: No If all of the above answers are "NO", then may proceed with Cephalosporin use.  . Sulfa Antibiotics     "hives"  . Tetanus Toxoids     "I had a strong reaction".    Labs:  Results for orders placed or performed during the hospital encounter of 03/14/16 (from the past 48 hour(s))  Basic metabolic panel     Status: Abnormal   Collection Time: 03/15/16  2:31 PM  Result Value Ref Range   Sodium  137 135 - 145 mmol/L   Potassium 2.5 (LL) 3.5 - 5.1 mmol/L    Comment: CRITICAL RESULT CALLED TO, READ BACK BY AND VERIFIED WITH BROOKE GREGORY @ 1513 03/15/16 BY [facility]    Chloride 99 (L) 101 - 111 mmol/L   CO2 25 22 - 32 mmol/L   Glucose, Bld 111 (H) 65 - 99 mg/dL   BUN 17 6 - 20 mg/dL   Creatinine, Ser 0.66 0.44 - 1.00 mg/dL   Calcium 7.1 (L) 8.9 - 10.3 mg/dL   GFR calc non Af Amer >60 >60 mL/min   GFR calc Af Amer >60 >60 mL/min    Comment: (NOTE) The eGFR has been calculated using the CKD EPI equation. This calculation has not been validated in all clinical situations. eGFR's persistently <60 mL/min signify possible Chronic Kidney Disease.    Anion gap 13 5 - 15  Glucose, capillary     Status: Abnormal   Collection Time: 03/15/16  4:46 PM  Result Value Ref Range   Glucose-Capillary 102 (H) 65 - 99 mg/dL  Glucose, capillary     Status: None   Collection Time: 03/15/16  9:21 PM  Result Value Ref Range   Glucose-Capillary 94 65 - 99 mg/dL   Comment 1 Notify RN   Basic metabolic panel     Status: Abnormal   Collection Time: 03/16/16  3:45 AM  Result Value Ref Range    Sodium 139 135 - 145 mmol/L   Potassium 4.2 3.5 - 5.1 mmol/L   Chloride 105 101 - 111 mmol/L   CO2 23 22 - 32 mmol/L   Glucose, Bld 103 (H) 65 - 99 mg/dL   BUN 16 6 - 20 mg/dL   Creatinine, Ser 0.65 0.44 - 1.00 mg/dL   Calcium 7.1 (L) 8.9 - 10.3 mg/dL   GFR calc non Af Amer >60 >60 mL/min   GFR calc Af Amer >60 >60 mL/min    Comment: (NOTE) The eGFR has been calculated using the CKD EPI equation. This calculation has not been validated in all clinical situations. eGFR's persistently <60 mL/min signify possible Chronic Kidney Disease.    Anion gap 11 5 - 15  Magnesium     Status: Abnormal   Collection Time: 03/16/16  3:45 AM  Result Value Ref Range   Magnesium 1.1 (L) 1.7 - 2.4 mg/dL  TSH     Status: None   Collection Time: 03/16/16  3:45 AM  Result Value Ref Range   TSH 1.579 0.350 - 4.500 uIU/mL    Comment: Performed by a 3rd Generation assay with a functional sensitivity of <=0.01 uIU/mL.  Glucose, capillary     Status: Abnormal   Collection Time: 03/16/16  7:42 AM  Result Value Ref Range   Glucose-Capillary 104 (H) 65 - 99 mg/dL   Comment 1 Notify RN    Comment 2 Document in Chart   Glucose, capillary     Status: Abnormal   Collection Time: 03/16/16 11:23 AM  Result Value Ref Range   Glucose-Capillary 113 (H) 65 - 99 mg/dL   Comment 1 Notify RN    Comment 2 Document in Chart   Albumin     Status: Abnormal   Collection Time: 03/16/16  2:08 PM  Result Value Ref Range   Albumin 1.8 (L) 3.5 - 5.0 g/dL  Lipase, blood     Status: None   Collection Time: 03/16/16  2:08 PM  Result Value Ref Range   Lipase 16 11 - 51 U/L  Lactic acid, plasma     Status: None   Collection Time: 03/16/16  2:08 PM  Result Value Ref Range   Lactic Acid, Venous 1.4 0.5 - 1.9 mmol/L  Glucose, capillary     Status: None   Collection Time: 03/16/16  4:23 PM  Result Value Ref Range   Glucose-Capillary 99 65 - 99 mg/dL   Comment 1 Notify RN    Comment 2 Document in Chart   Glucose,  capillary     Status: Abnormal   Collection Time: 03/16/16  9:51 PM  Result Value Ref Range   Glucose-Capillary 57 (L) 65 - 99 mg/dL   Comment 1 Notify RN   Glucose, capillary     Status: Abnormal   Collection Time: 03/16/16 10:20 PM  Result Value Ref Range   Glucose-Capillary 59 (L) 65 - 99 mg/dL   Comment 1 Notify RN   Glucose, capillary     Status: None   Collection Time: 03/17/16 12:28 AM  Result Value Ref Range   Glucose-Capillary 72 65 - 99 mg/dL  Basic metabolic panel     Status: Abnormal   Collection Time: 03/17/16  3:34 AM  Result Value Ref Range   Sodium 141 135 - 145 mmol/L   Potassium 4.1 3.5 - 5.1 mmol/L   Chloride 109 101 - 111 mmol/L   CO2 26 22 - 32 mmol/L   Glucose, Bld 123 (H) 65 - 99 mg/dL   BUN 14 6 - 20 mg/dL   Creatinine, Ser 0.72 0.44 - 1.00 mg/dL   Calcium 7.1 (L) 8.9 - 10.3 mg/dL   GFR calc non Af Amer >60 >60 mL/min   GFR calc Af Amer >60 >60 mL/min    Comment: (NOTE) The eGFR has been calculated using the CKD EPI equation. This calculation has not been validated in all clinical situations. eGFR's persistently <60 mL/min signify possible Chronic Kidney Disease.    Anion gap 6 5 - 15  Magnesium     Status: None   Collection Time: 03/17/16  3:34 AM  Result Value Ref Range   Magnesium 2.3 1.7 - 2.4 mg/dL  Glucose, capillary     Status: None   Collection Time: 03/17/16  8:14 AM  Result Value Ref Range   Glucose-Capillary 91 65 - 99 mg/dL  Glucose, capillary     Status: None   Collection Time: 03/17/16 11:54 AM  Result Value Ref Range   Glucose-Capillary 84 65 - 99 mg/dL    Current Facility-Administered Medications  Medication Dose Route Frequency Provider Last Rate Last Dose  . 0.9 % NaCl with KCl 20 mEq/ L  infusion   Intravenous Continuous Srikar Sudini, MD 75 mL/hr at 03/17/16 0512    . albumin human 25 % solution 25 g  25 g Intravenous Q8H Srikar Sudini, MD   25 g at 03/17/16 1006  . buPROPion (WELLBUTRIN XL) 24 hr tablet 150 mg  150 mg  Oral BID Hillary Bow, MD   150 mg at 03/16/16 2128  . enoxaparin (LOVENOX) injection 40 mg  40 mg Subcutaneous Q24H Lance Coon, MD   40 mg at 03/16/16 2100  . feeding supplement (GLUCERNA SHAKE) (GLUCERNA SHAKE) liquid 237 mL  237 mL Oral TID BM Srikar Sudini, MD      . fluconazole (DIFLUCAN) tablet 100 mg  100 mg Oral Daily Srikar Sudini, MD      . insulin aspart (novoLOG) injection 0-15 Units  0-15 Units Subcutaneous TID WC Harrie Foreman, MD  2 Units at 03/15/16 1338  . insulin aspart (novoLOG) injection 4 Units  4 Units Subcutaneous TID WC Harrie Foreman, MD      . insulin glargine (LANTUS) injection 11 Units  11 Units Subcutaneous Q24H Hillary Bow, MD   11 Units at 03/17/16 1143  . insulin regular (NOVOLIN R,HUMULIN R) 250 Units in sodium chloride 0.9 % 250 mL (1 Units/mL) infusion   Intravenous Continuous Lance Coon, MD   Stopped at 03/15/16 0830  . magnesium oxide (MAG-OX) tablet 400 mg  400 mg Oral Daily Hillary Bow, MD   400 mg at 03/16/16 1849  . metoCLOPramide (REGLAN) injection 5 mg  5 mg Intravenous TID AC Srikar Sudini, MD   5 mg at 03/17/16 5027  . ondansetron (ZOFRAN) injection 4 mg  4 mg Intravenous Q6H PRN Hillary Bow, MD   4 mg at 03/17/16 7412  . potassium chloride (KLOR-CON) packet 40 mEq  40 mEq Oral BID Vaughan Basta, MD   40 mEq at 03/16/16 2128  . tiZANidine (ZANAFLEX) tablet 4 mg  4 mg Oral BID Hillary Bow, MD   4 mg at 03/16/16 2128    Musculoskeletal: Strength & Muscle Tone: decreased Gait & Station: unable to stand Patient leans: N/A  Psychiatric Specialty Exam: I reviewed physical examination performed by medical doctor in agreement with the findings.  Physical Exam  Nursing note and vitals reviewed. Psychiatric: Her speech is normal. Judgment and thought content normal. Her affect is blunt. She is withdrawn. Cognition and memory are normal. She exhibits a depressed mood.    Review of Systems  Psychiatric/Behavioral: Positive for  depression.  All other systems reviewed and are negative.   Blood pressure (!) 156/73, pulse (!) 113, temperature 98.4 F (36.9 C), temperature source Oral, resp. rate 18, height _0  (1.6 m), weight 73.6 kg (162 lb 4.1 oz), SpO2 99 %.Body mass index is 28.74 kg/m.  General Appearance: Fairly Groomed  Eye Contact:  Good  Speech:  Slow  Volume:  Decreased  Mood:  Depressed  Affect:  Blunt  Thought Process:  Goal Directed and Descriptions of Associations: Intact  Orientation:  Full (Time, Place, and Person)  Thought Content:  WDL  Suicidal Thoughts:  No  Homicidal Thoughts:  No  Memory:  Immediate;   Fair Recent;   Fair Remote;   Fair  Judgement:  Fair  Insight:  Present  Psychomotor Activity:  Psychomotor Retardation  Concentration:  Concentration: Fair and Attention Span: Fair  Recall:  [employer] of Knowledge:  Fair  Language:  Fair  Akathisia:  No  Handed:  Right  AIMS (if indicated):     Assets:  Communication Skills Desire for Improvement Financial Resources/Insurance Resilience Social Support  ADL's:  Impaired  Cognition:  Impaired,  Mild  Sleep:        Treatment Plan Summary: Daily contact with patient to assess and evaluate symptoms and progress in treatment and Medication management   Ms. Stehr is a 71 year old bed-ridden female with a history of depression and multiple medical problems facing placement in a nursing facility.  1. Depression. Please continue Wellbutrin. I will add Remeron for depression, sleep and appetite.  2. Placement. The patient seems to understand and accept placement.  3. Debility. Even though depression is likely a contributing factor to worsening of her condition, I do not believe that there would be much change if depression was magically removed.  4. Please contact Dr. Weber Cooks if follow up necessary.  Disposition: No evidence  of imminent risk to self or others at present.   Patient does not meet criteria for psychiatric  inpatient admission. Supportive therapy provided about ongoing stressors. Discussed crisis plan, support from social network, calling 911, coming to the Emergency Department, and calling Suicide Hotline.  Orson Slick, MD 03/17/2016 12:37 PM

## 2016-03-17 NOTE — Progress Notes (Addendum)
Blood sugar checked prior to meal, 55. Orange juice given, recheck at 30 mins, 59.  Another juice with added sugar given. Will continue to monitor.

## 2016-03-18 LAB — COMPREHENSIVE METABOLIC PANEL
ALK PHOS: 135 U/L — AB (ref 38–126)
ALT: 11 U/L — ABNORMAL LOW (ref 14–54)
AST: 37 U/L (ref 15–41)
Albumin: 2.2 g/dL — ABNORMAL LOW (ref 3.5–5.0)
Anion gap: 6 (ref 5–15)
BILIRUBIN TOTAL: 0.5 mg/dL (ref 0.3–1.2)
BUN: 9 mg/dL (ref 6–20)
CALCIUM: 7.4 mg/dL — AB (ref 8.9–10.3)
CHLORIDE: 108 mmol/L (ref 101–111)
CO2: 26 mmol/L (ref 22–32)
CREATININE: 0.77 mg/dL (ref 0.44–1.00)
Glucose, Bld: 123 mg/dL — ABNORMAL HIGH (ref 65–99)
Potassium: 3.6 mmol/L (ref 3.5–5.1)
Sodium: 140 mmol/L (ref 135–145)
TOTAL PROTEIN: 4.9 g/dL — AB (ref 6.5–8.1)

## 2016-03-18 LAB — GLUCOSE, CAPILLARY
GLUCOSE-CAPILLARY: 140 mg/dL — AB (ref 65–99)
GLUCOSE-CAPILLARY: 161 mg/dL — AB (ref 65–99)
GLUCOSE-CAPILLARY: 69 mg/dL (ref 65–99)
Glucose-Capillary: 550 mg/dL (ref 65–99)
Glucose-Capillary: 105 mg/dL — ABNORMAL HIGH (ref 65–99)

## 2016-03-18 LAB — CBC WITH DIFFERENTIAL/PLATELET
Basophils Absolute: 0.1 10*3/uL (ref 0–0.1)
Basophils Relative: 1 %
EOS PCT: 3 %
Eosinophils Absolute: 0.2 10*3/uL (ref 0–0.7)
HEMATOCRIT: 19.3 % — AB (ref 35.0–47.0)
Hemoglobin: 6.3 g/dL — ABNORMAL LOW (ref 12.0–16.0)
LYMPHS ABS: 2.7 10*3/uL (ref 1.0–3.6)
LYMPHS PCT: 34 %
MCH: 26.7 pg (ref 26.0–34.0)
MCHC: 32.9 g/dL (ref 32.0–36.0)
MCV: 81.1 fL (ref 80.0–100.0)
Monocytes Absolute: 0.4 10*3/uL (ref 0.2–0.9)
Monocytes Relative: 6 %
NEUTROS ABS: 4.4 10*3/uL (ref 1.4–6.5)
Neutrophils Relative %: 56 %
Platelets: 259 10*3/uL (ref 150–440)
RBC: 2.38 MIL/uL — AB (ref 3.80–5.20)
RDW: 15.2 % — ABNORMAL HIGH (ref 11.5–14.5)
WBC: 7.8 10*3/uL (ref 3.6–11.0)

## 2016-03-18 LAB — VITAMIN B12: VITAMIN B 12: 688 pg/mL (ref 180–914)

## 2016-03-18 LAB — FERRITIN: FERRITIN: 321 ng/mL — AB (ref 11–307)

## 2016-03-18 LAB — FOLATE: Folate: 2.5 ng/mL — ABNORMAL LOW (ref >5.9)

## 2016-03-18 LAB — IRON AND TIBC: IRON: 31 ug/dL (ref 28–170)

## 2016-03-18 MED ORDER — ARIPIPRAZOLE 5 MG PO TABS
5.0000 mg | ORAL_TABLET | Freq: Every day | ORAL | Status: DC
  Administered 2016-03-19 – 2016-03-20 (×2): 5 mg via ORAL
  Filled 2016-03-18 (×3): qty 1

## 2016-03-18 MED ORDER — BUPROPION HCL ER (XL) 150 MG PO TB24
450.0000 mg | ORAL_TABLET | Freq: Every day | ORAL | Status: DC
Start: 2016-03-19 — End: 2016-03-21
  Administered 2016-03-19 – 2016-03-20 (×2): 450 mg via ORAL
  Filled 2016-03-18 (×2): qty 1

## 2016-03-18 MED ORDER — SODIUM CHLORIDE 0.9 % IV SOLN
Freq: Once | INTRAVENOUS | Status: AC
  Administered 2016-03-18: 17:00:00 via INTRAVENOUS

## 2016-03-18 MED ORDER — INSULIN GLARGINE 100 UNIT/ML ~~LOC~~ SOLN
8.0000 [IU] | SUBCUTANEOUS | Status: DC
  Administered 2016-03-19 – 2016-03-20 (×2): 8 [IU] via SUBCUTANEOUS
  Filled 2016-03-18 (×3): qty 0.08

## 2016-03-18 NOTE — Progress Notes (Addendum)
Previous nurse hung the dose of albumin at 1815, next dose hung at 0200

## 2016-03-18 NOTE — Consult Note (Signed)
[facility] Face-to-Face Psychiatry Consult   Reason for Consult:  Depression. Referring Physician:  Dr. Darvin Neighbours Patient Identification: Sylvia Black MRN:  [id number] Principal Diagnosis: Diabetic ketoacidosis without coma associated with type 2 diabetes mellitus [facility]) Diagnosis:   Patient Active Problem List   Diagnosis Date Noted  . Pressure injury of skin [L89.90] 03/15/2016  . AKI (acute kidney injury) ([place]) [N17.9] 03/14/2016  . HLD (hyperlipidemia) [E78.5] 01/20/2016  . GERD (gastroesophageal reflux disease) [K21.9] 01/20/2016  . Anxiety [F41.9] 01/20/2016  . DKA (diabetic ketoacidoses) ([place]) [E13.10] 01/20/2016  . 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 Altered mental status [R41.82]   . Diabetic ketoacidosis without coma associated with type 2 diabetes mellitus ([place]) [E13.10]   . Encounter for central line placement [Z45.2]   . Acidosis [E87.2] 07/01/2015  . Stroke [facility]) [I63.9]   . Hypertension [I10]   . Frequent UTI [N39.0]   . Diabetes mellitus without complication ([place]) [H20.9]   . Hypoglycemia [E16.2] 02/21/2013    Total Time spent with patient: 30 minutes  Subjective:    This is a follow-up note for February 26. Patient seen. Dr. Lucy Antigua note reviewed. Patient was seen by Dr. Mamie Nick over the weekend and some adjustments were made for treating her depression. When I saw the patient this afternoon I found the patient to actually apparently be worse. Patient tells me she is feeling very depressed. She tells me that she is so depressed she can't even speak. She is only talking in a whisper and only a couple of words. She looks in something of a days but not like she is delirious. She has suicidal thoughts without any intention or plan. Denies hallucinations. Not able to give me much information because of how limited she is in communication right now.  Identifying data. Sylvia Black is a 71 y.o. female with a history of depression and multiple  medical problems.  Chief complaint. "I am better."  History of present illness. Information was obtained from the patient and the chart. Apparently the patient has a history of depression that has been maintained with Wellbutrin prescribed by her primary care provider. The patient was admitted to medical floor for DKA, UTI, and poor oral intake. She is bedbound. My understanding is that the family is no longer able to take care of this patient and placement in a nursing home is being attempted. There is some thinking that her debility may be related to depression. The patient is very pleasant and polite on interview. She reports that she is feeling a little better today. She initially denies any symptoms of depression, anxiety, or psychosis but admits that she feels sad "a little" and that she cries "a little". She reports poor sleep and absolutely no appetite. Her daughter believes that the mother is stubborn and refuses to cooperate. The patient was seen in consultation by Dr. Weber Cooks in June 2017. At that time the patient denied depressive symptoms and was continued on Wellbutrin. I spoke with the patient about the plan to place her in a nursing facility. She seems to be aware of it and accepting. She has very limited social interaction. She has had a very hard time adjusting to her widowhood after her husband passed away 5 years ago. She has no interest in any activities, not even watching TV. She never gets out of the house except for doctor's appointments as she is truly bedbound. She adamantly denies any suicidal thoughts, intention or plans. There are no  alcohol or substances involved.  Past psychiatric history. She has been treated for depression by her primary providers with Wellbutrin. She was hospitalized in a mental hospital many years ago. She never attempted suicide.  Family psychiatry history. She believes that there are multiple family members with emotional problems.  Social history. For  the past five years, since her husband's death, she has been living with her daughter and her grandson. This arrangement would no longer be possible and the patient will be placed in a nursing home.  Risk to Self: Is patient at risk for suicide?: No Risk to Others:   Prior Inpatient Therapy:   Prior Outpatient Therapy:    Past Medical History:  Past Medical History:  Diagnosis Date  . Anxiety   . Bipolar disorder ([place])   . Bullous pemphigoid   . 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    "left; see pain dr for that" (02/22/2013)  . Depression   . Family history of anesthesia complication    "daughter's w/PONV" (02/22/2013)  . Frequent UTI   . GERD (gastroesophageal reflux disease)   . Gout attack    "twice" (02/22/2013)  . High cholesterol   . Hypertension   . Pneumonia 1990's   "once"  . PONV (postoperative nausea and vomiting)    "makes me violently ill" (02/22/2013)  . Sleep apnea    "dx'd in the 1990's; don't wear mask" (02/22/2013)  . Stroke [facility]) 2010; 02/2012   denies residual; "balance issues since & weak on left side since" (02/22/2013)  . Type II diabetes mellitus ([place])   . Vertigo     Past Surgical History:  Procedure Laterality Date  . ANTERIOR CERVICAL DECOMP/DISCECTOMY FUSION  1989  . APPENDECTOMY  1984  . CATARACT EXTRACTION W/ INTRAOCULAR LENS  IMPLANT, BILATERAL Bilateral ~ 2008  . CHOLECYSTECTOMY  1984  . POSTERIOR FUSION CERVICAL SPINE  ~ 1988  . TUBAL LIGATION  1973  . UPPER ESOPHAGEAL ENDOSCOPIC ULTRASOUND (EUS) N/A 11/09/2015   Procedure: UPPER ESOPHAGEAL ENDOSCOPIC ULTRASOUND (EUS);  Surgeon: Jola Schmidt, MD;  [facility] END[facility];  Service: Endoscopy;  Laterality: N/A;  . WRIST SURGERY Right ~ 1985   "tied off damagaed nerve" (02/22/2013)   Family History:  Family History  Problem Relation Age of Onset  . Heart disease Mother   . Diabetes Mother   . Heart disease Father     Social History:  History  Alcohol Use  . Yes    Comment: 02/22/2013 "mixed drink on  special occasions; glass of wine q 2-3 months"     History  Drug Use No    Social History   Social History  . Marital status: Widowed    Spouse name: N/A  . Number of children: N/A  . Years of education: N/A   Social History Main Topics  . Smoking status: Passive Smoke Exposure - Never Smoker  . Smokeless tobacco: Never Used  . Alcohol use Yes     Comment: 02/22/2013 "mixed drink on special occasions; glass of wine q 2-3 months"  . Drug use: No  . Sexual activity: Not Asked   Other Topics Concern  . None   Social History Narrative  . None   Additional Social History:    Allergies:   Allergies  Allergen Reactions  . Ciprofloxacin     Other reaction(s): Hallucinations  . Latex   . Penicillins Hives    Has patient had a PCN reaction causing immediate rash, facial/tongue/throat swelling, SOB or lightheadedness with hypotension: Yes  Has patient had a PCN reaction causing severe rash involving mucus membranes or skin necrosis: No Has patient had a PCN reaction that required hospitalization Yes Has patient had a PCN reaction occurring within the last 10 years: No If all of the above answers are "NO", then may proceed with Cephalosporin use.  . Sulfa Antibiotics     "hives"  . Tetanus Toxoids     "I had a strong reaction".    Labs:  Results for orders placed or performed during the hospital encounter of 03/14/16 (from the past 48 hour(s))  Glucose, capillary     Status: Abnormal   Collection Time: 03/16/16  9:51 PM  Result Value Ref Range   Glucose-Capillary 57 (L) 65 - 99 mg/dL   Comment 1 Notify RN   Glucose, capillary     Status: Abnormal   Collection Time: 03/16/16 10:20 PM  Result Value Ref Range   Glucose-Capillary 59 (L) 65 - 99 mg/dL   Comment 1 Notify RN   Glucose, capillary     Status: None   Collection Time: 03/17/16 12:28 AM  Result Value Ref Range   Glucose-Capillary 72 65 - 99 mg/dL  Basic metabolic panel     Status: Abnormal   Collection Time:  03/17/16  3:34 AM  Result Value Ref Range   Sodium 141 135 - 145 mmol/L   Potassium 4.1 3.5 - 5.1 mmol/L   Chloride 109 101 - 111 mmol/L   CO2 26 22 - 32 mmol/L   Glucose, Bld 123 (H) 65 - 99 mg/dL   BUN 14 6 - 20 mg/dL   Creatinine, Ser 0.72 0.44 - 1.00 mg/dL   Calcium 7.1 (L) 8.9 - 10.3 mg/dL   GFR calc non Af Amer >60 >60 mL/min   GFR calc Af Amer >60 >60 mL/min    Comment: (NOTE) The eGFR has been calculated using the CKD EPI equation. This calculation has not been validated in all clinical situations. eGFR's persistently <60 mL/min signify possible Chronic Kidney Disease.    Anion gap 6 5 - 15  Magnesium     Status: None   Collection Time: 03/17/16  3:34 AM  Result Value Ref Range   Magnesium 2.3 1.7 - 2.4 mg/dL  Glucose, capillary     Status: None   Collection Time: 03/17/16  8:14 AM  Result Value Ref Range   Glucose-Capillary 91 65 - 99 mg/dL  Glucose, capillary     Status: None   Collection Time: 03/17/16 11:54 AM  Result Value Ref Range   Glucose-Capillary 84 65 - 99 mg/dL  Glucose, capillary     Status: Abnormal   Collection Time: 03/17/16  4:26 PM  Result Value Ref Range   Glucose-Capillary 55 (L) 65 - 99 mg/dL  Glucose, capillary     Status: Abnormal   Collection Time: 03/17/16  4:57 PM  Result Value Ref Range   Glucose-Capillary 59 (L) 65 - 99 mg/dL  Glucose, capillary     Status: None   Collection Time: 03/17/16  5:27 PM  Result Value Ref Range   Glucose-Capillary 68 65 - 99 mg/dL  Glucose, capillary     Status: None   Collection Time: 03/17/16  5:28 PM  Result Value Ref Range   Glucose-Capillary 77 65 - 99 mg/dL  Glucose, capillary     Status: Abnormal   Collection Time: 03/17/16  9:39 PM  Result Value Ref Range   Glucose-Capillary 132 (H) 65 - 99 mg/dL   Comment 1  Notify RN   CBC with Differential/Platelet     Status: Abnormal   Collection Time: 03/18/16  4:01 AM  Result Value Ref Range   WBC 7.8 3.6 - 11.0 K/uL   RBC 2.38 (L) 3.80 - 5.20 MIL/uL    Hemoglobin 6.3 (L) 12.0 - 16.0 g/dL   HCT 19.3 (L) 35.0 - 47.0 %   MCV 81.1 80.0 - 100.0 fL   MCH 26.7 26.0 - 34.0 pg   MCHC 32.9 32.0 - 36.0 g/dL   RDW 15.2 (H) 11.5 - 14.5 %   Platelets 259 150 - 440 K/uL   Neutrophils Relative % 56 %   Neutro Abs 4.4 1.4 - 6.5 K/uL   Lymphocytes Relative 34 %   Lymphs Abs 2.7 1.0 - 3.6 K/uL   Monocytes Relative 6 %   Monocytes Absolute 0.4 0.2 - 0.9 K/uL   Eosinophils Relative 3 %   Eosinophils Absolute 0.2 0 - 0.7 K/uL   Basophils Relative 1 %   Basophils Absolute 0.1 0 - 0.1 K/uL  Comprehensive metabolic panel     Status: Abnormal   Collection Time: 03/18/16  4:01 AM  Result Value Ref Range   Sodium 140 135 - 145 mmol/L   Potassium 3.6 3.5 - 5.1 mmol/L   Chloride 108 101 - 111 mmol/L   CO2 26 22 - 32 mmol/L   Glucose, Bld 123 (H) 65 - 99 mg/dL   BUN 9 6 - 20 mg/dL   Creatinine, Ser 0.77 0.44 - 1.00 mg/dL   Calcium 7.4 (L) 8.9 - 10.3 mg/dL   Total Protein 4.9 (L) 6.5 - 8.1 g/dL   Albumin 2.2 (L) 3.5 - 5.0 g/dL   AST 37 15 - 41 U/L   ALT 11 (L) 14 - 54 U/L   Alkaline Phosphatase 135 (H) 38 - 126 U/L   Total Bilirubin 0.5 0.3 - 1.2 mg/dL   GFR calc non Af Amer >60 >60 mL/min   GFR calc Af Amer >60 >60 mL/min    Comment: (NOTE) The eGFR has been calculated using the CKD EPI equation. This calculation has not been validated in all clinical situations. eGFR's persistently <60 mL/min signify possible Chronic Kidney Disease.    Anion gap 6 5 - 15  Glucose, capillary     Status: Abnormal   Collection Time: 03/18/16  7:57 AM  Result Value Ref Range   Glucose-Capillary 105 (H) 65 - 99 mg/dL  Glucose, capillary     Status: Abnormal   Collection Time: 03/18/16 11:16 AM  Result Value Ref Range   Glucose-Capillary 140 (H) 65 - 99 mg/dL  Iron and TIBC     Status: None   Collection Time: 03/18/16  2:05 PM  Result Value Ref Range   Iron 31 28 - 170 ug/dL   TIBC NOT CALCULATED 250 - 450 ug/dL   Saturation Ratios NOT CALCULATED 10.4 - 31.8  %   UIBC NOT CALCULATED ug/dL  Ferritin     Status: Abnormal   Collection Time: 03/18/16  2:05 PM  Result Value Ref Range   Ferritin 321 (H) 11 - 307 ng/mL  Folate     Status: Abnormal   Collection Time: 03/18/16  2:05 PM  Result Value Ref Range   Folate 2.5 (L) >5.9 ng/mL  Type and screen Hold 1 unit     Status: None (Preliminary result)   Collection Time: 03/18/16  2:05 PM  Result Value Ref Range   Blood Product Unit Number [id number]  Unit Type and Rh 6200    Blood Product Expiration Date 166063016010    ISSUE DATE / TIME [id number]    Blood Product Unit Number [id number]    PRODUCT CODE [id number]    Unit Type and Rh 7371    Blood Product Expiration Date 062694854627    Blood Product Unit Number [id number]    Unit Type and Rh 3716    Blood Product Expiration Date 967893810175   Glucose, capillary     Status: Abnormal   Collection Time: 03/18/16  4:01 PM  Result Value Ref Range   Glucose-Capillary 161 (H) 65 - 99 mg/dL  Prepare RBC     Status: None   Collection Time: 03/18/16  4:45 PM  Result Value Ref Range   Order Confirmation ORDER PROCESSED BY BLOOD BANK     Current Facility-Administered Medications  Medication Dose Route Frequency Provider Last Rate Last Dose  . ARIPiprazole (ABILIFY) tablet 5 mg  5 mg Oral Daily Gonzella Lex, MD      . Derrill Memo ON 03/19/2016] buPROPion (WELLBUTRIN XL) 24 hr tablet 450 mg  450 mg Oral Daily John T Clapacs, MD      . feeding supplement (GLUCERNA SHAKE) (GLUCERNA SHAKE) liquid 237 mL  237 mL Oral TID BM Hillary Bow, MD   237 mL at 03/18/16 0819  . fluconazole (DIFLUCAN) tablet 100 mg  100 mg Oral Daily Hillary Bow, MD   100 mg at 03/18/16 0816  . insulin aspart (novoLOG) injection 0-15 Units  0-15 Units Subcutaneous TID WC Harrie Foreman, MD   3 Units at 03/18/16 1709  . [START ON 03/19/2016] insulin glargine (LANTUS) injection 8 Units  8 Units Subcutaneous Q24H Srikar Sudini, MD      . liver oil-zinc oxide (DESITIN) 40  % ointment   Topical PRN Srikar Sudini, MD      . magnesium oxide (MAG-OX) tablet 400 mg  400 mg Oral Daily Hillary Bow, MD   400 mg at 03/18/16 0817  . metoCLOPramide (REGLAN) injection 5 mg  5 mg Intravenous TID AC Srikar Sudini, MD   5 mg at 03/18/16 1708  . mirtazapine (REMERON SOL-TAB) disintegrating tablet 15 mg  15 mg Oral QHS Jolanta B Pucilowska, MD   15 mg at 03/17/16 2200  . ondansetron (ZOFRAN) injection 4 mg  4 mg Intravenous Q6H PRN Hillary Bow, MD   4 mg at 03/17/16 1025  . potassium chloride (KLOR-CON) packet 40 mEq  40 mEq Oral BID Vaughan Basta, MD   40 mEq at 03/18/16 0816  . tiZANidine (ZANAFLEX) tablet 4 mg  4 mg Oral BID Hillary Bow, MD   4 mg at 03/18/16 8527    Musculoskeletal: Strength & Muscle Tone: decreased Gait & Station: unable to stand Patient leans: N/A  Psychiatric Specialty Exam: I reviewed physical examination performed by medical doctor in agreement with the findings.  Physical Exam  Nursing note and vitals reviewed. Psychiatric: Judgment normal. Her affect is blunt and inappropriate. Her speech is delayed. She is slowed and withdrawn. Cognition and memory are normal. She exhibits a depressed mood. She expresses suicidal ideation. She expresses no suicidal plans.    Review of Systems  Psychiatric/Behavioral: Positive for depression and suicidal ideas.  All other systems reviewed and are negative.   Blood pressure (!) 124/51, pulse 61, temperature 98.5 F (36.9 C), temperature source Oral, resp. rate 16, height '5\' 3"'$  (1.6 m), weight 73.6 kg (162 lb 4.1 oz), SpO2 99 %.Body mass index is 28.74 kg/m.  General Appearance: Fairly Groomed  Eye Contact:  Good  Speech:  Slow  Volume:  Decreased  Mood:  Depressed  Affect:  Blunt  Thought Process:  Goal Directed and Descriptions of Associations: Intact  Orientation:  Full (Time, Place, and Person)  Thought Content:  WDL  Suicidal Thoughts:  No  Homicidal Thoughts:  No  Memory:  Immediate;    Fair Recent;   Fair Remote;   Fair  Judgement:  Fair  Insight:  Present  Psychomotor Activity:  Psychomotor Retardation  Concentration:  Concentration: Fair and Attention Span: Fair  Recall:  [employer] of Knowledge:  Fair  Language:  Fair  Akathisia:  No  Handed:  Right  AIMS (if indicated):     Assets:  Communication Skills Desire for Improvement Financial Resources/Insurance Resilience Social Support  ADL's:  Impaired  Cognition:  Impaired,  Mild  Sleep:        Treatment Plan Summary: Daily contact with patient to assess and evaluate symptoms and progress in treatment and Medication management   Sylvia Black is a 71 year old bed-ridden female with a history of depression and multiple medical problems facing placement in a nursing facility.  1. Depression. Patient for whatever reason is much worse today. She is whispering, looks out of that and is voicing suicidal thoughts although she denies any intent. I am going to increase her Wellbutrin to 450 mg a day in the morning, and I am going to add 5 mg of Abilify for treatment of depression. Plan discussed with patient. Supportive counseling. She is agreeable..  2. Placement. The patient seems to understand and accept placement.  3. Debility. Even though depression is likely a contributing factor to worsening of her condition, I do not believe that there would be much change if depression was magically removed.  4. Please contact Dr. Weber Cooks if follow up necessary.  Disposition: No evidence of imminent risk to self or others at present.   Patient does not meet criteria for psychiatric inpatient admission. Supportive therapy provided about ongoing stressors. Discussed crisis plan, support from social network, calling 911, coming to the Emergency Department, and calling Suicide Hotline.  Alethia Berthold, MD 03/18/2016 6:37 PM

## 2016-03-18 NOTE — Care Management (Signed)
Patient from home with daughter. She is wheelchair bound and hoyer lift at baseline. Open to [facility] for nursing.

## 2016-03-18 NOTE — Progress Notes (Signed)
[facility] [place] [facility]   PATIENT NAME: Sylvia Black    MR#:  [id number]  DATE OF BIRTH:  10/13/1945  SUBJECTIVE:  CHIEF COMPLAINT:   Chief Complaint  Patient presents with  . Hyperglycemia   Admitted with DKA, found to have UTI.  DKA has resolved  Not speaking much. Flat affect.  REVIEW OF SYSTEMS:    Review of Systems  Unable to perform ROS: Dementia    DRUG ALLERGIES:   Allergies  Allergen Reactions  . Ciprofloxacin     Other reaction(s): Hallucinations  . Latex   . Penicillins Hives    Has patient had a PCN reaction causing immediate rash, facial/tongue/throat swelling, SOB or lightheadedness with hypotension: Yes Has patient had a PCN reaction causing severe rash involving mucus membranes or skin necrosis: No Has patient had a PCN reaction that required hospitalization Yes Has patient had a PCN reaction occurring within the last 10 years: No If all of the above answers are "NO", then may proceed with Cephalosporin use.  . Sulfa Antibiotics     "hives"  . Tetanus Toxoids     "I had a strong reaction".    VITALS:  Blood pressure (!) 121/49, pulse 64, temperature 97.9 F (36.6 C), temperature source Oral, resp. rate 16, height 5\' 3"  (1.6 m), weight 73.6 kg (162 lb 4.1 oz), SpO2 97 %.  PHYSICAL EXAMINATION:  GENERAL:  71 y.o.-year-old patient lying in the bed with no acute distress.  EYES: Pupils equal, round, reactive to light and accommodation. No scleral icterus. Extraocular muscles intact.  HEENT: Head atraumatic, normocephalic. Oropharynx and nasopharynx clear.  NECK:  Supple, no jugular venous distention. No thyroid enlargement, no tenderness.  LUNGS: Normal breath sounds bilaterally, no wheezing, rales,rhonchi or crepitation. No use of accessory muscles of respiration.  CARDIOVASCULAR: S1, S2 normal. No murmurs, rubs, or gallops.  ABDOMEN: Soft, nontender, nondistended. Bowel sounds present. No organomegaly or mass.   EXTREMITIES: edema in all extremities. Bruising NEUROLOGIC: Cranial nerves II through XII are intact. Motor 1/5 in lower extremities and 3/5 in upper extremities Sensation intact. Gait not checked.  PSYCHIATRIC: The patient is alert and awake. Flat affect SKIN: No obvious rash, lesion, or ulcer.   Physical Exam LABORATORY PANEL:   CBC  Recent Labs Lab 03/18/16 0401  WBC 7.8  HGB 6.3*  HCT 19.3*  PLT 259   ------------------------------------------------------------------------------------------------------------------  Chemistries   Recent Labs Lab 03/17/16 0334 03/18/16 0401  NA 141 140  K 4.1 3.6  CL 109 108  CO2 26 26  GLUCOSE 123* 123*  BUN 14 9  CREATININE 0.72 0.77  CALCIUM 7.1* 7.4*  MG 2.3  --   AST  --  37  ALT  --  11*  ALKPHOS  --  135*  BILITOT  --  0.5   ------------------------------------------------------------------------------------------------------------------  Cardiac Enzymes No results for input(s): TROPONINI in the last 168 hours. ------------------------------------------------------------------------------------------------------------------  RADIOLOGY:  Ct Abdomen Pelvis W Contrast  Result Date: 03/17/2016 CLINICAL DATA:  71 y.o. female Who presents with decreased appetite, generalized malaise and elevated blood sugar. Family reports patient has not been eating well over the last several months but this week has been much worse than usual. Patient reports mild nausea. Her glucose is also been elevated. They've been hesitant to give insulin because she is not eating. No fevers reported. Patient denies dysuria or frequency. EXAM: CT ABDOMEN AND PELVIS WITH CONTRAST TECHNIQUE: Multidetector CT imaging of the abdomen and pelvis was performed using  the standard protocol following bolus administration of intravenous contrast. CONTRAST:  ISOVUE-300 IOPAMIDOL (ISOVUE-300) INJECTION 61% COMPARISON:  01/22/2016 FINDINGS: Lower chest: Small left  a minimal right pleural effusions. Dependent lower lobe opacity consistent with atelectasis. No convincing pneumonia. No pulmonary edema. Heart is borderline enlarged. Hepatobiliary: No liver mass or focal lesion. Gallbladder surgically absent. Common bile duct measures 9 mm. It shows distal tapering. This is stable. Pancreas: There is atrophy of the pancreas, but no pancreatic mass or inflammation. Spleen: Normal in size without focal abnormality. Adrenals/Urinary Tract: No adrenal masses. No renal masses or stones. No hydronephrosis. Ureters are normal course and caliber. Bladder is unremarkable. Stomach/Bowel: Stomach is decompressed but otherwise unremarkable. No bowel dilation. No bowel wall thickening or mesenteric inflammation. Appendix not visualized. Vascular/Lymphatic: Aortic atherosclerosis. No enlarged abdominal or pelvic lymph nodes. Reproductive: Heterogeneous mass expands the cervix measuring 5.2 x 3.2 x 4.6 cm, stable from the prior CT and CT dated 10/05/2015. Apparent submucosal fibroid, also stable. No adnexal masses. Other: No abdominal wall hernia or abnormality. No abdominopelvic ascites. Musculoskeletal: No fracture or acute finding. No osteoblastic or osteolytic lesions. IMPRESSION: 1. No acute findings within the abdomen or pelvis. 2. Stable uterine/cervical masses consistent with fibroids. 3. No evidence of bowel obstruction or inflammation. 4. Small left and minimal right pleural effusions with associated atelectasis. This is new since the prior exam. 5. Status post cholecystectomy. Mild chronic dilation of common bile duct. 6. Aortic atherosclerosis. Electronically Signed   By: Amie Portland M.D.   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 Check stool for occult blood. Will transfuse 1 unit. Called HCPOA Daughter and left voicemail. Need consent  * chronic vomiting likely gastroparesis On IV Reglan. Discussed with GI. Had EGD in the past Treat symptomatically with Reglan  * DKA, resolved Uncontrolled diabetes with hypo-and hyperglycemic episodes On lantus and SSI. Reduced dose of Lantus.   * UTI Yeast in the urine. Started fluconazole. Stopped ceftriaxone.  * Hypokalemia  Replaced and now normal  *  Hypertension   Cont home meds.  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 Start Glucerna. Albumin IV. Received 3 doses to help with anasarca            All the records are reviewed and case discussed with Care Management/Social Worker Management plans discussed with the patient, family and they are in agreement.  CODE STATUS: DNR  TOTAL TIME TAKING CARE OF THIS PATIENT: 35 minutes.   POSSIBLE D/C IN 1-2 DAYS, DEPENDING ON CLINICAL CONDITION.  Milagros Loll R M.D on 03/18/2016   Between 7am to 6pm - Pager - [phone]  After 6pm go to [email or web address] - password [email or web address]  [facility] [place] [facility]  Office  [phone]  CC: Primary care physician; Emogene Morgan, MD  Note: This dictation was prepared with Dragon dictation along  with smaller phrase technology. Any transcriptional errors that result from this process are unintentional.

## 2016-03-18 NOTE — Progress Notes (Signed)
No charge note:   Palliative consult received-   Patient nonverbal- not able to participate in GOC. Left message for patient's daughter, [name] to arrange meeting.   Ocie BobKasie Hyden Soley, AGNP-C Palliative Medicine  Please call Palliative Medicine team phone with any questions [phone]. For individual providers please see AMION.

## 2016-03-18 NOTE — Progress Notes (Signed)
Spoke with Dr Elpidio AnisSudini regarding pt drop in hemoglobin down to 6.3 from 8.4 yesterday. No reported bleeding over night and no signs of any bleeding in stool, no bloody emesis. MD stated he will discuss with daughter what she wants to do as she is POA. Palliative has been consulted and will discuss with daughter as well. No new orders received at this time.

## 2016-03-18 NOTE — Progress Notes (Addendum)
Initial Nutrition Assessment  DOCUMENTATION CODES:   Not applicable  INTERVENTION:  1. Glucerna Shake po TID, each supplement provides 220 kcal and 10 grams of protein  NUTRITION DIAGNOSIS:   Inadequate oral intake related to poor appetite, lethargy/confusion, chronic illness as evidenced by per patient/family report.  GOAL:   Patient will meet greater than or equal to 90% of their needs  MONITOR:   PO intake, I & O's, Labs, Weight trends  REASON FOR ASSESSMENT:   Low Braden    ASSESSMENT:   Sylvia Black  is a 71 y.o. female who presents with Malaise and nausea for the past several days. Patient is unable to contribute significantly to her history of present illness and she is a very poor historian.  Attempted to speak with Sylvia Black at bedside, she did not communicate with me. Per chart appears she exhibits a 98#/38% severe wt loss over 7 months. Had tray at bedside, had not consumed any of it. Documented PO intake: 0-25% thus far Nutrition-Focused physical exam completed. Findings are moderate fat depletion, severe muscle depletion, and severe edema.  Labs and medications reviewed: CBGs 140, 105 Mag-Ox, Reglan, Remeron, KCL Insulin  Diet Order:  DIET DYS 3 Room service appropriate? Yes; Fluid consistency: Thin  Skin:  Wound (see comment) (Stg II to buttocks)  Last BM:  03/15/2016  Height:   Ht Readings from Last 1 Encounters:  03/14/16 5\' 3"  (1.6 m)    Weight:   Wt Readings from Last 1 Encounters:  03/14/16 162 lb 4.1 oz (73.6 kg)    Ideal Body Weight:  52.27 kg  BMI:  Body mass index is 28.74 kg/m.  Estimated Nutritional Needs:   Kcal:  1500-1850 calories (20-25 cal/kg)  Protein:  73-88 gm  Fluid:  >/= 1.5L  EDUCATION NEEDS:   No education needs identified at this time  Dionne AnoWilliam M. Pryce Folts, MS, RD LDN Inpatient Clinical Dietitian Pager [phone]

## 2016-03-18 NOTE — Progress Notes (Signed)
Inpatient Diabetes Program Recommendations  AACE/ADA: New Consensus Statement on Inpatient Glycemic Control (2015)  Target Ranges:  Prepandial:   less than 140 mg/dL      Peak postprandial:   less than 180 mg/dL (1-2 hours)      Critically ill patients:  140 - 180 mg/dL   Results for Sylvia Black, Sylvia Black (MRN [id number]) as of 03/18/2016 10:14  Ref. Range 03/17/2016 08:14 03/17/2016 11:54 03/17/2016 16:26 03/17/2016 16:57 03/17/2016 17:27 03/17/2016 17:28 03/17/2016 21:39 03/18/2016 07:57  Glucose-Capillary Latest Ref Range: 65 - 99 mg/dL 91 84 55 (L) 59 (L) 68 77 132 (H) 105 (H)   Review of Glycemic Control  Outpatient Diabetes medications: Lantus 14 units QHS, Novolog 4 units TID with meals (home med list notes patient is not taking Novolog) Current orders for Inpatient glycemic control: Lantus 8 units Q24H, Novolog 0-15 units TID with meals, Novolog 4 units TID with meals for meal coverage  Inpatient Diabetes Program Recommendations: Insulin - Basal: Noted Lantus was decreased from 11 to 8 units today but patient has already received Lantus 11 units this morning. Correction (SSI): Please consider decreasing Novolog correction to sensitive scale (Novolog 0-9 units).  NOTE: In reviewing chart, noted patient did NOT receive any Novolog insulin on 03/17/16 but did receive Lantus 11 units on 03/17/16 and has already received Lantus 11 units today. Per chart, patient is not eating well.   Thanks, Orlando PennerMarie Hayde Kilgour, RN, MSN, CDE Diabetes Coordinator Inpatient Diabetes Program [phone] (Team Pager from 8am to 5pm)

## 2016-03-19 DIAGNOSIS — F0630 Mood disorder due to known physiological condition, unspecified: Secondary | ICD-10-CM

## 2016-03-19 DIAGNOSIS — Z7189 Other specified counseling: Secondary | ICD-10-CM

## 2016-03-19 DIAGNOSIS — Z515 Encounter for palliative care: Secondary | ICD-10-CM

## 2016-03-19 DIAGNOSIS — E43 Unspecified severe protein-calorie malnutrition: Secondary | ICD-10-CM

## 2016-03-19 LAB — BASIC METABOLIC PANEL
ANION GAP: 6 (ref 5–15)
BUN: 9 mg/dL (ref 6–20)
CALCIUM: 7.6 mg/dL — AB (ref 8.9–10.3)
CO2: 28 mmol/L (ref 22–32)
CREATININE: 0.72 mg/dL (ref 0.44–1.00)
Chloride: 106 mmol/L (ref 101–111)
GFR calc Af Amer: >60 mL/min (ref >60)
GLUCOSE: 150 mg/dL — AB (ref 65–99)
Potassium: 3.8 mmol/L (ref 3.5–5.1)
Sodium: 140 mmol/L (ref 135–145)

## 2016-03-19 LAB — GLUCOSE, CAPILLARY
GLUCOSE-CAPILLARY: 212 mg/dL — AB (ref 65–99)
GLUCOSE-CAPILLARY: 120 mg/dL — AB (ref 65–99)
Glucose-Capillary: 149 mg/dL — ABNORMAL HIGH (ref 65–99)
Glucose-Capillary: 134 mg/dL — ABNORMAL HIGH (ref 65–99)

## 2016-03-19 LAB — CBC WITH DIFFERENTIAL/PLATELET
BASOS ABS: 0.1 10*3/uL (ref 0–0.1)
BASOS PCT: 1 %
EOS ABS: 0.3 10*3/uL (ref 0–0.7)
Eosinophils Relative: 3 %
HEMATOCRIT: 24.6 % — AB (ref 35.0–47.0)
Hemoglobin: 8.3 g/dL — ABNORMAL LOW (ref 12.0–16.0)
Lymphocytes Relative: 36 %
Lymphs Abs: 2.7 10*3/uL (ref 1.0–3.6)
MCH: 27.8 pg (ref 26.0–34.0)
MCHC: 33.6 g/dL (ref 32.0–36.0)
MCV: 82.7 fL (ref 80.0–100.0)
MONO ABS: 0.6 10*3/uL (ref 0.2–0.9)
MONOS PCT: 8 %
NEUTROS ABS: 3.9 10*3/uL (ref 1.4–6.5)
Neutrophils Relative %: 52 %
PLATELETS: 231 10*3/uL (ref 150–440)
RBC: 2.97 MIL/uL — ABNORMAL LOW (ref 3.80–5.20)
RDW: 14.7 % — AB (ref 11.5–14.5)
WBC: 7.5 10*3/uL (ref 3.6–11.0)

## 2016-03-19 MED ORDER — METOCLOPRAMIDE HCL 10 MG PO TABS
10.0000 mg | ORAL_TABLET | Freq: Three times a day (TID) | ORAL | Status: DC
  Administered 2016-03-19 – 2016-03-21 (×5): 10 mg via ORAL
  Filled 2016-03-19 (×7): qty 1

## 2016-03-19 MED ORDER — FOLIC ACID 1 MG PO TABS
1.0000 mg | ORAL_TABLET | Freq: Every day | ORAL | Status: DC
  Administered 2016-03-19 – 2016-03-20 (×2): 1 mg via ORAL
  Filled 2016-03-19 (×2): qty 1

## 2016-03-19 MED ORDER — GLUCERNA SHAKE PO LIQD: 237.0000 mL | Freq: Three times a day (TID) | ORAL | 0 refills | Status: AC

## 2016-03-19 MED ORDER — NYSTATIN 100000 UNIT/ML MT SUSP
5.0000 mL | Freq: Four times a day (QID) | OROMUCOSAL | Status: DC
  Administered 2016-03-19 – 2016-03-20 (×6): 500000 [IU] via ORAL
  Filled 2016-03-19 (×5): qty 5

## 2016-03-19 NOTE — Progress Notes (Signed)
Patient had several soft stools tonight. MD notified. Orders received for some immodium.

## 2016-03-19 NOTE — Progress Notes (Signed)
New referral for [facility] services following discharge received from CMRN Lisa Jacobs. Patient was admitted from home on 2/22 for treatment of a UTI and DKA. Per chart note review patient has been having frequent nausea at home and has had continued here at the hospital. GI was consulted and no acute process was found. She has been treated with IV albumin for her anasarca and received a unit of blood on 2/26 d/t a drop in her hemoglobin from 8.4 to 6.3. Patient continues to eat only bites. Psychiatry has been involved d/t her history of depression, Welbutrin was increased to 450 mg and Abilify 5 mg was added. Patient remains lethargic with poor oral intake. Patient's daughter met this morning with Palliative Medicine NP Megan Mason and the decision was made for her to discharge home with Hospice services. °Writer contacted her daughter [name] via telephone ([phone]), message left for her to return the call. °Patient seen lying in bed, eyes open, staff aide Sabrina present to feed her lunch. Patient did not engage with writer, she was able to take a few sips of tea and a bite of mashed potatoes. She appeared to be falling back to sleep and feeding was stopped. Awaiting return call from daughter [name]. CMRN Lisa made aware. Patient information faxed to referral. No DME needs identified a this time. Thank you. °Karen Robertson RN, BSN, CHPN °[facility], Hospital Liaison °[phone] c °

## 2016-03-19 NOTE — Progress Notes (Signed)
[employer] -  at [facility]   PATIENT NAME: Tatianna Ibbotson    MR#:  [id number]  DATE OF BIRTH:  07-20-1945  SUBJECTIVE:  CHIEF COMPLAINT:   Chief Complaint  Patient presents with  . Hyperglycemia   Admitted with DKA, found to have UTI.  DKA has resolved  Not speaking much. Flat affect.  REVIEW OF SYSTEMS:    Review of Systems  Unable to perform ROS: Dementia    DRUG ALLERGIES:   Allergies  Allergen Reactions  . Ciprofloxacin     Other reaction(s): Hallucinations  . Latex   . Penicillins Hives    Has patient had a PCN reaction causing immediate rash, facial/tongue/throat swelling, SOB or lightheadedness with hypotension: Yes Has patient had a PCN reaction causing severe rash involving mucus membranes or skin necrosis: No Has patient had a PCN reaction that required hospitalization Yes Has patient had a PCN reaction occurring within the last 10 years: No If all of the above answers are "NO", then may proceed with Cephalosporin use.  . Sulfa Antibiotics     "hives"  . Tetanus Toxoids     "I had a strong reaction".    VITALS:  Blood pressure (!) 125/52, pulse 72, temperature 98.6 F (37 C), temperature source Oral, resp. rate 16, height 5\' 3"  (1.6 m), weight 73.6 kg (162 lb 4.1 oz), SpO2 97 %.  PHYSICAL EXAMINATION:  GENERAL:  71 y.o.-year-old patient lying in the bed with no acute distress.  EYES: Pupils equal, round, reactive to light and accommodation. No scleral icterus. Extraocular muscles intact.  HEENT: Head atraumatic, normocephalic. Oropharynx and nasopharynx clear.  NECK:  Supple, no jugular venous distention. No thyroid enlargement, no tenderness.  LUNGS: Normal breath sounds bilaterally, no wheezing, rales,rhonchi or crepitation. No use of accessory muscles of respiration.  CARDIOVASCULAR: S1, S2 normal. No murmurs, rubs, or gallops.  ABDOMEN: Soft, nontender, nondistended. Bowel sounds present. No organomegaly or mass.   EXTREMITIES: edema in all extremities. Bruising NEUROLOGIC: Cranial nerves II through XII are intact. Motor 1/5 in lower extremities and 3/5 in upper extremities Sensation intact. Gait not checked.  PSYCHIATRIC: The patient is alert and awake. Flat affect SKIN: No obvious rash, lesion, or ulcer.   Physical Exam LABORATORY PANEL:   CBC  Recent Labs Lab 03/19/16 0454  WBC 7.5  HGB 8.3*  HCT 24.6*  PLT 231   ------------------------------------------------------------------------------------------------------------------  Chemistries   Recent Labs Lab 03/17/16 0334 03/18/16 0401 03/19/16 0454  NA 141 140 140  K 4.1 3.6 3.8  CL 109 108 106  CO2 26 26 28   GLUCOSE 123* 123* 150*  BUN 14 9 9   CREATININE 0.72 0.77 0.72  CALCIUM 7.1* 7.4* 7.6*  MG 2.3  --   --   AST  --  37  --   ALT  --  11*  --   ALKPHOS  --  135*  --   BILITOT  --  0.5  --    ------------------------------------------------------------------------------------------------------------------  Cardiac Enzymes No results for input(s): TROPONINI in the last 168 hours. ------------------------------------------------------------------------------------------------------------------  RADIOLOGY:  No results found.  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 Transfused 1 unit and Hb improved  * chronic vomiting likely gastroparesis On IV Reglan. Discussed with GI. Had EGD in  the past Treat symptomatically with Reglan. change to PO reglan from IV. D/C home with hospice if no vomiting  * DKA, resolved Uncontrolled diabetes with hypo-and hyperglycemic episodes On lantus and SSI. Reduced dose of Lantus to 8 units   * UTI Yeast in the urine. Started fluconazole. Stopped ceftriaxone.  * Hypokalemia  Replaced and now normal  *  Hypertension   Cont home meds.  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 Started Glucerna. Albumin IV. Received 3 doses to help with anasarca            All the records are reviewed and case discussed with Care Management/Social Worker Management plans discussed with the patient, family and they are in agreement.  CODE STATUS: DNR  TOTAL TIME TAKING CARE OF THIS PATIENT: 35 minutes.   POSSIBLE D/C IN 1-2 DAYS, DEPENDING ON CLINICAL CONDITION.  Milagros LollSudini, Darci Lykins R M.D on 03/19/2016   Between 7am to 6pm - Pager - [phone]  After 6pm go to [email or web address] - password [email or web address] [email or web address]  [facility] [place] [facility]  Office  [phone]  CC: Primary care physician; Emogene MorganAYCOCK, NGWE A, MD  Note: This dictation was prepared with Dragon dictation along with smaller phrase technology. Any transcriptional errors that result from this process are unintentional.

## 2016-03-19 NOTE — Consult Note (Signed)
Consultation Note Date: 03/19/2016   Patient Name: Sylvia Black  DOB: May 20, 1945  MRN: [id number]  Age / Sex: 71 y.o., female  PCP: Sylvia Coffin, MD Referring Physician: Hillary Bow, MD  Reason for Consultation: Establishing goals of care  HPI/Patient Profile: 71 y.o. female  with past medical history of type 2 diabetes mellitus, stroke, sleep apnea, HTN, HLD, UTI, GERD, depression, bipolar disorder, and anxiety admitted on 03/14/2016 with hyperglycemia. In ED, mild DKA and started on insulin infusion. Positive for UTI/yeast in urine and receiving fluconazole. Acute on chronic anemia s/p 1 unit PRBC. She has been evaluated by GI for chronic vomiting. Recent EGD 10/2015 revealed no obstruction. Uncontrolled diabetes likely causing delayed gastric emptying. Patient receiving reglan. She has been evaluated by psychiatry for severe depression-Welbutrin increased and Abilify added to medication regimen. Palliative medicine consultation for goals of care.   Clinical Assessment and Goals of Care: I have reviewed medical records and met with patient and daughter, Sylvia Black, at bedside to discuss diagnosis, prognosis, GOC, EOL wishes, disposition and options. During my visit, the patient is awake, alert, and is engaged in the conversation.   Introduced Palliative Medicine as specialized medical care for people living with serious illness. It focuses on providing relief from the symptoms and stress of a serious illness. The goal is to improve quality of life for both the patient and the family.  We discussed a brief life review of the patient. She worked for the [employer]. 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 Daughter speaks of her great decline since her husband has died, with depression and multiple strokes. She feels "mom didn't take care of herself because she was too worried about dad."  The patient has lived with her daughter for many years. She has been bed bound since January 2017. They use a hoyer lift to occasionally get her sitting up in the wheelchair. Patient with weight loss and poor Sylvia intake.   Discussed current hospital diagnoses and co-morbidities that are likely contributing to decline. Daughter, Sylvia Black, is POA. The patient has been clear on her wishes of not wanting to be resuscitated or on life support. Patient and daughter agree that placing a feeding tube would NOT give her a better quality of life or return to the independent individual she was prior to strokes/deconditiong.    Knowing the patient will not do well at a rehab facility, hospice services outpatient were explained and offered. Discussed focus on comfort, quality, dignity for the time she has left. Discussed symptom management and the goal to keep her comfortable if she should decline at home-only re-hospitalization if comfort needs cannot be met at home. Daughter agrees with hospice services at home. She works from home (along with her son and friend) and there is someone available to assist Sylvia Black 24/7.    Questions and concerns were addressed. Daughter has my contact information.     SUMMARY OF RECOMMENDATIONS    DNR/DNI. No feeding tube.  Discussed and educated on hospice services. Daughter agrees with home hospice services and focus on comfort and symptom management if she declines. RN CM notified.   PMT will continue to shadow chart and support patient/family during hospitalization.  Code Status/Advance Care Planning:  DNR   Symptom Management:   Nystatin suspension QID for thrush   Palliative Prophylaxis:   Aspiration, Bowel Regimen, Delirium Protocol, Frequent Pain Assessment, Sylvia Care and Turn Reposition  Psycho-social/Spiritual:   Desire for further Chaplaincy support:no  Additional Recommendations: Caregiving  Support/Resources and Education on Hospice  Prognosis:    Unable to determine guarded with baseline poor functional status and poor Sylvia intake/severe protein-calorie malnutrition.   Discharge Planning: Home with Hospice      Primary Diagnoses: Present on Admission: . Diabetic ketoacidosis without coma associated with type 2 diabetes mellitus ([place]) . Hypertension . Anxiety . HLD (hyperlipidemia) . GERD (gastroesophageal reflux disease) . Frequent UTI . AKI (acute kidney injury) ([place]) . DKA (diabetic ketoacidoses) ([place])   I have reviewed the medical record, interviewed the patient and family, and examined the patient. The following aspects are pertinent.  Past Medical History:  Diagnosis Date  . Anxiety   . Bipolar disorder ([place])   . Bullous pemphigoid   . 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    "left; see pain dr for that" (02/22/2013)  . Depression   . Family history of anesthesia complication    "daughter's w/PONV" (02/22/2013)  . Frequent UTI   . GERD (gastroesophageal reflux disease)   . Gout attack    "twice" (02/22/2013)  . High cholesterol   . Hypertension   . Pneumonia 1990's   "once"  . PONV (postoperative nausea and vomiting)    "makes me violently ill" (02/22/2013)  . Sleep apnea    "dx'd in the 1990's; don't wear mask" (02/22/2013)  . Stroke [facility]) 2010; 02/2012   denies residual; "balance issues since & weak on left side since" (02/22/2013)  . Type II diabetes mellitus ([place])   . Vertigo    Social History   Social History  . Marital status: Widowed    Spouse name: N/A  . Number of children: N/A  . Years of education: N/A   Social History Main Topics  . Smoking status: Passive Smoke Exposure - Never Smoker  . Smokeless tobacco: Never Used  . Alcohol use Yes     Comment: 02/22/2013 "mixed drink on special occasions; glass of wine q 2-3 months"  . Drug use: No  . Sexual activity: Not Asked   Other Topics Concern  . None   Social History Narrative  . None   Family History  Problem Relation Age of Onset  . Heart disease  Mother   . Diabetes Mother   . Heart disease Father    Scheduled Meds: . ARIPiprazole  5 mg Sylvia Daily  . buPROPion  450 mg Sylvia Daily  . feeding supplement (GLUCERNA SHAKE)  237 mL Sylvia TID BM  . fluconazole  100 mg Sylvia Daily  . folic acid  1 mg Sylvia Daily  . insulin aspart  0-15 Units Subcutaneous TID WC  . insulin glargine  8 Units Subcutaneous Q24H  . magnesium oxide  400 mg Sylvia Daily  . metoCLOPramide (REGLAN) injection  5 mg Intravenous TID AC  . mirtazapine  15 mg Sylvia QHS  . nystatin  5 mL Sylvia QID  . potassium chloride  40 mEq Sylvia BID  . tiZANidine  4 mg Sylvia BID   Continuous Infusions: PRN  Meds:.liver oil-zinc oxide, ondansetron (ZOFRAN) IV Medications Prior to Admission:  Prior to Admission medications   Medication Sig Start Date End Date Taking? Authorizing Provider  acetaminophen (TYLENOL) 325 MG tablet Take 2 tablets (650 mg total) by mouth every 6 (six) hours as needed for mild pain, fever or headache. 01/24/16  Yes Gladstone Lighter, MD  ammonium lactate (LAC-HYDRIN) 12 % lotion Apply twice a day to feet bilaterally 07/07/15  Yes Loletha Grayer, MD  buPROPion (WELLBUTRIN XL) 150 MG 24 hr tablet Take 150 mg by mouth 2 (two) times daily.    Yes Historical Provider, MD  diclofenac sodium (VOLTAREN) 1 % GEL Apply topically 4 (four) times daily.   Yes Historical Provider, MD  Insulin Glargine (LANTUS) 100 UNIT/ML Solostar Pen Inject 14 Units into the skin at bedtime. 01/24/16  Yes Gladstone Lighter, MD  lidocaine (LIDODERM) 5 % Place 1 patch onto the skin daily. Remove & Discard patch within 12 hours or as directed by MD   Yes Historical Provider, MD  metoCLOPramide (REGLAN) 10 MG tablet Take 1 tablet (10 mg total) by mouth 4 (four) times daily -  before meals and at bedtime. 01/24/16  Yes Gladstone Lighter, MD  ondansetron (ZOFRAN-ODT) 4 MG disintegrating tablet Take 4 mg by mouth 3 (three) times daily as needed. 03/06/16  Yes Historical Provider, MD  tiZANidine (ZANAFLEX) 4  MG tablet Take 4 mg by mouth 2 (two) times daily. 12/30/14  Yes Historical Provider, MD  atorvastatin (LIPITOR) 40 MG tablet Take 1 tablet (40 mg total) by mouth daily at 6 PM. Patient not taking: Reported on 03/14/2016 07/07/15   Loletha Grayer, MD  clopidogrel (PLAVIX) 75 MG tablet Take 1 tablet (75 mg total) by mouth daily. Patient not taking: Reported on 03/14/2016 07/07/15   Loletha Grayer, MD  insulin aspart (NOVOLOG) 100 UNIT/ML FlexPen Inject 4 Units into the skin 3 (three) times daily with meals. Patient not taking: Reported on 03/14/2016 08/09/15   Gladstone Lighter, MD  Insulin Pen Needle (CARETOUCH PEN NEEDLES) 32G X 4 MM MISC Use with flex pens for insulin 08/09/15   Gladstone Lighter, MD  phosphorus (K PHOS NEUTRAL) [phone] MG tablet Take 2 tablets (500 mg total) by mouth 4 (four) times daily. Patient not taking: Reported on 03/14/2016 01/24/16   Epifanio Lesches, MD  predniSONE (DELTASONE) 5 MG tablet Take 1 tablet (5 mg total) by mouth daily with breakfast. Patient not taking: Reported on 03/14/2016 07/07/15   Loletha Grayer, MD   Allergies  Allergen Reactions  . Ciprofloxacin     Other reaction(s): Hallucinations  . Latex   . Penicillins Hives    Has patient had a PCN reaction causing immediate rash, facial/tongue/throat swelling, SOB or lightheadedness with hypotension: Yes Has patient had a PCN reaction causing severe rash involving mucus membranes or skin necrosis: No Has patient had a PCN reaction that required hospitalization Yes Has patient had a PCN reaction occurring within the last 10 years: No If all of the above answers are "NO", then may proceed with Cephalosporin use.  . Sulfa Antibiotics     "hives"  . Tetanus Toxoids     "I had a strong reaction".   Review of Systems  Constitutional: Positive for activity change, appetite change and fatigue.  HENT: Positive for sore throat.   Gastrointestinal: Positive for nausea and vomiting.   Physical Exam    Constitutional: She is oriented to person, place, and time. She is cooperative.  HENT:  Head: Normocephalic and atraumatic.  thrush  Cardiovascular: Regular rhythm and normal heart sounds.   Pulmonary/Chest: Effort normal. She has decreased breath sounds.  Abdominal: Normal appearance. Bowel sounds are increased. There is no tenderness.  Musculoskeletal: She exhibits edema (generalized).  Neurological: She is alert and oriented to person, place, and time.  Skin: Skin is warm and dry. Abrasion (BLE) noted. There is pallor.  Psychiatric: She has a normal mood and affect. Her behavior is normal. Her speech is delayed. Cognition and memory are normal.  Nursing note and vitals reviewed.   Vital Signs: BP (!) 122/37 (BP Location: Right Arm)   Pulse 74   Temp 98.6 F (37 C) (Sylvia)   Resp 18   Ht _0  (1.6 m)   Wt 73.6 kg (162 lb 4.1 oz)   SpO2 97%   BMI 28.74 kg/m  Pain Assessment: No/denies pain     SpO2: SpO2: 97 % O2 Device:SpO2: 97 % O2 Flow Rate: .   IO: Intake/output summary:   Intake/Output Summary (Last 24 hours) at 03/19/16 1003 Last data filed at 03/18/16 2025  Gross per 24 hour  Intake              410 ml  Output                0 ml  Net              410 ml    LBM: Last BM Date: 03/16/15 Baseline Weight: Weight: 79.4 kg (175 lb) Most recent weight: Weight: 73.6 kg (162 lb 4.1 oz)     Palliative Assessment/Data: PPS 30%   Flowsheet Rows   Flowsheet Row Most Recent Value  Intake Tab  Referral Department  Hospitalist  Unit at Time of Referral  Orthopedic Unit  Palliative Care Primary Diagnosis  Cancer  Date Notified  03/18/16  Palliative Care Type  New Palliative care  Reason for referral  Clarify Goals of Care  Date of Admission  03/14/16  Date first seen by Palliative Care  03/19/16  # of days IP prior to Palliative referral  4  Clinical Assessment  Palliative Performance Scale Score  30%  Psychosocial & Spiritual Assessment  Palliative Care  Outcomes  Patient/Family meeting held?  Yes  Who was at the meeting?  patient and daughter  Palliative Care Outcomes  Clarified goals of care, Counseled regarding hospice, Provided psychosocial or spiritual support, Transitioned to hospice, Improved non-pain symptom therapy      Time In: 0845 Time Out: 1000 Time Total: 92mn Greater than 50%  of this time was spent counseling and coordinating care related to the above assessment and plan.  Signed by:  MIhor Dow FNP-C Palliative Medicine Team  Phone: [phone]: [phone]  Please contact Palliative Medicine Team phone at [phone] questions and concerns.  For individual provider: See AShea Evans

## 2016-03-19 NOTE — Consult Note (Signed)
[facility] Face-to-Face Psychiatry Consult   Reason for Consult:  Depression. Referring Physician:  Dr. Darvin Neighbours Patient Identification: DONNAMAE MUILENBURG MRN:  [id number] Principal Diagnosis: Diabetic ketoacidosis without coma associated with type 2 diabetes mellitus [facility]) Diagnosis:   Patient Active Problem List   Diagnosis Date Noted  . Severe protein-calorie malnutrition ([place]) [E43]   . Palliative care by specialist [Z51.5]   . Encounter for hospice care discussion [Z71.89]   . Pressure injury of skin [L89.90] 03/15/2016  . AKI (acute kidney injury) ([place]) [N17.9] 03/14/2016  . HLD (hyperlipidemia) [E78.5] 01/20/2016  . GERD (gastroesophageal reflux disease) [K21.9] 01/20/2016  . Anxiety [F41.9] 01/20/2016  . DKA (diabetic ketoacidoses) ([place]) [E13.10] 01/20/2016  . 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 Altered mental status [R41.82]   . Diabetic ketoacidosis without coma associated with type 2 diabetes mellitus ([place]) [E13.10]   . Encounter for central line placement [Z45.2]   . Acidosis [E87.2] 07/01/2015  . Stroke [facility]) [I63.9]   . Hypertension [I10]   . Frequent UTI [N39.0]   . Diabetes mellitus without complication ([place]) [D62.2]   . Hypoglycemia [E16.2] 02/21/2013    Total Time spent with patient: 15 minutes  Subjective:    Follow-up for Tuesday the 27th. Patient seen. She tells me she is a little bit better today. She is speaking a little more loudly. Denies any current suicidal ideation. Still being treated for her medical problems. No change to medication psychiatrically  Identifying data. Ms. MAILLE HALLIWELL is a 71 y.o. female with a history of depression and multiple medical problems.  Chief complaint. "I am better."  History of present illness. Information was obtained from the patient and the chart. Apparently the patient has a history of depression that has been maintained with Wellbutrin prescribed by her primary care provider. The  patient was admitted to medical floor for DKA, UTI, and poor oral intake. She is bedbound. My understanding is that the family is no longer able to take care of this patient and placement in a nursing home is being attempted. There is some thinking that her debility may be related to depression. The patient is very pleasant and polite on interview. She reports that she is feeling a little better today. She initially denies any symptoms of depression, anxiety, or psychosis but admits that she feels sad "a little" and that she cries "a little". She reports poor sleep and absolutely no appetite. Her daughter believes that the mother is stubborn and refuses to cooperate. The patient was seen in consultation by Dr. Weber Cooks in June 2017. At that time the patient denied depressive symptoms and was continued on Wellbutrin. I spoke with the patient about the plan to place her in a nursing facility. She seems to be aware of it and accepting. She has very limited social interaction. She has had a very hard time adjusting to her widowhood after her husband passed away 5 years ago. She has no interest in any activities, not even watching TV. She never gets out of the house except for doctor's appointments as she is truly bedbound. She adamantly denies any suicidal thoughts, intention or plans. There are no alcohol or substances involved.  Past psychiatric history. She has been treated for depression by her primary providers with Wellbutrin. She was hospitalized in a mental hospital many years ago. She never attempted suicide.  Family psychiatry history. She believes that there are multiple family members with emotional problems.  Social history. For the past five  years, since her husband's death, she has been living with her daughter and her grandson. This arrangement would no longer be possible and the patient will be placed in a nursing home.  Risk to Self: Is patient at risk for suicide?: No Risk to Others:   Prior  Inpatient Therapy:   Prior Outpatient Therapy:    Past Medical History:  Past Medical History:  Diagnosis Date  . Anxiety   . Bipolar disorder ([place])   . Bullous pemphigoid   . 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    "left; see pain dr for that" (02/22/2013)  . Depression   . Family history of anesthesia complication    "daughter's w/PONV" (02/22/2013)  . Frequent UTI   . GERD (gastroesophageal reflux disease)   . Gout attack    "twice" (02/22/2013)  . High cholesterol   . Hypertension   . Pneumonia 1990's   "once"  . PONV (postoperative nausea and vomiting)    "makes me violently ill" (02/22/2013)  . Sleep apnea    "dx'd in the 1990's; don't wear mask" (02/22/2013)  . Stroke [facility]) 2010; 02/2012   denies residual; "balance issues since & weak on left side since" (02/22/2013)  . Type II diabetes mellitus ([place])   . Vertigo     Past Surgical History:  Procedure Laterality Date  . ANTERIOR CERVICAL DECOMP/DISCECTOMY FUSION  1989  . APPENDECTOMY  1984  . CATARACT EXTRACTION W/ INTRAOCULAR LENS  IMPLANT, BILATERAL Bilateral ~ 2008  . CHOLECYSTECTOMY  1984  . POSTERIOR FUSION CERVICAL SPINE  ~ 1988  . TUBAL LIGATION  1973  . UPPER ESOPHAGEAL ENDOSCOPIC ULTRASOUND (EUS) N/A 11/09/2015   Procedure: UPPER ESOPHAGEAL ENDOSCOPIC ULTRASOUND (EUS);  Surgeon: Jola Schmidt, MD;  Location: [facility] ENDOSCOPY;  Service: Endoscopy;  Laterality: N/A;  . WRIST SURGERY Right ~ 1985   "tied off damagaed nerve" (02/22/2013)   Family History:  Family History  Problem Relation Age of Onset  . Heart disease Mother   . Diabetes Mother   . Heart disease Father     Social History:  History  Alcohol Use  . Yes    Comment: 02/22/2013 "mixed drink on special occasions; glass of wine q 2-3 months"     History  Drug Use No    Social History   Social History  . Marital status: Widowed    Spouse name: N/A  . Number of children: N/A  . Years of education: N/A   Social History Main Topics  . Smoking status: Passive  Smoke Exposure - Never Smoker  . Smokeless tobacco: Never Used  . Alcohol use Yes     Comment: 02/22/2013 "mixed drink on special occasions; glass of wine q 2-3 months"  . Drug use: No  . Sexual activity: Not Asked   Other Topics Concern  . None   Social History Narrative  . None   Additional Social History:    Allergies:   Allergies  Allergen Reactions  . Ciprofloxacin     Other reaction(s): Hallucinations  . Latex   . Penicillins Hives    Has patient had a PCN reaction causing immediate rash, facial/tongue/throat swelling, SOB or lightheadedness with hypotension: Yes Has patient had a PCN reaction causing severe rash involving mucus membranes or skin necrosis: No Has patient had a PCN reaction that required hospitalization Yes Has patient had a PCN reaction occurring within the last 10 years: No If all of the above answers are "NO", then may proceed with Cephalosporin use.  . Sulfa  Antibiotics     "hives"  . Tetanus Toxoids     "I had a strong reaction".    Labs:  Results for orders placed or performed during the hospital encounter of 03/14/16 (from the past 48 hour(s))  Glucose, capillary     Status: Abnormal   Collection Time: 03/17/16  9:39 PM  Result Value Ref Range   Glucose-Capillary 132 (H) 65 - 99 mg/dL   Comment 1 Notify RN   CBC with Differential/Platelet     Status: Abnormal   Collection Time: 03/18/16  4:01 AM  Result Value Ref Range   WBC 7.8 3.6 - 11.0 K/uL   RBC 2.38 (L) 3.80 - 5.20 MIL/uL   Hemoglobin 6.3 (L) 12.0 - 16.0 g/dL   HCT 19.3 (L) 35.0 - 47.0 %   MCV 81.1 80.0 - 100.0 fL   MCH 26.7 26.0 - 34.0 pg   MCHC 32.9 32.0 - 36.0 g/dL   RDW 15.2 (H) 11.5 - 14.5 %   Platelets 259 150 - 440 K/uL   Neutrophils Relative % 56 %   Neutro Abs 4.4 1.4 - 6.5 K/uL   Lymphocytes Relative 34 %   Lymphs Abs 2.7 1.0 - 3.6 K/uL   Monocytes Relative 6 %   Monocytes Absolute 0.4 0.2 - 0.9 K/uL   Eosinophils Relative 3 %   Eosinophils Absolute 0.2 0 - 0.7  K/uL   Basophils Relative 1 %   Basophils Absolute 0.1 0 - 0.1 K/uL  Comprehensive metabolic panel     Status: Abnormal   Collection Time: 03/18/16  4:01 AM  Result Value Ref Range   Sodium 140 135 - 145 mmol/L   Potassium 3.6 3.5 - 5.1 mmol/L   Chloride 108 101 - 111 mmol/L   CO2 26 22 - 32 mmol/L   Glucose, Bld 123 (H) 65 - 99 mg/dL   BUN 9 6 - 20 mg/dL   Creatinine, Ser 0.77 0.44 - 1.00 mg/dL   Calcium 7.4 (L) 8.9 - 10.3 mg/dL   Total Protein 4.9 (L) 6.5 - 8.1 g/dL   Albumin 2.2 (L) 3.5 - 5.0 g/dL   AST 37 15 - 41 U/L   ALT 11 (L) 14 - 54 U/L   Alkaline Phosphatase 135 (H) 38 - 126 U/L   Total Bilirubin 0.5 0.3 - 1.2 mg/dL   GFR calc non Af Amer >60 >60 mL/min   GFR calc Af Amer >60 >60 mL/min    Comment: (NOTE) The eGFR has been calculated using the CKD EPI equation. This calculation has not been validated in all clinical situations. eGFR's persistently <60 mL/min signify possible Chronic Kidney Disease.    Anion gap 6 5 - 15  Glucose, capillary     Status: Abnormal   Collection Time: 03/18/16  7:57 AM  Result Value Ref Range   Glucose-Capillary 105 (H) 65 - 99 mg/dL  Glucose, capillary     Status: Abnormal   Collection Time: 03/18/16 11:16 AM  Result Value Ref Range   Glucose-Capillary 140 (H) 65 - 99 mg/dL  Iron and TIBC     Status: None   Collection Time: 03/18/16  2:05 PM  Result Value Ref Range   Iron 31 28 - 170 ug/dL   TIBC NOT CALCULATED 250 - 450 ug/dL   Saturation Ratios NOT CALCULATED 10.4 - 31.8 %   UIBC NOT CALCULATED ug/dL  Ferritin     Status: Abnormal   Collection Time: 03/18/16  2:05 PM  Result Value Ref  Range   Ferritin 321 (H) 11 - 307 ng/mL  Vitamin B12     Status: None   Collection Time: 03/18/16  2:05 PM  Result Value Ref Range   Vitamin B-12 688 180 - 914 pg/mL    Comment: (NOTE) This assay is not validated for testing neonatal or myeloproliferative syndrome specimens for Vitamin B12 levels. Performed at [facility], [place] [address], [place], [place] [address]   Folate     Status: Abnormal   Collection Time: 03/18/16  2:05 PM  Result Value Ref Range   Folate 2.5 (L) >5.9 ng/mL  Type and screen Hold 1 unit     Status: None (Preliminary result)   Collection Time: 03/18/16  2:05 PM  Result Value Ref Range   Blood Product Unit Number [id number]    Unit Type and Rh 6200    Blood Product Expiration Date 440102725366    ISSUE DATE / TIME [id number]    Blood Product Unit Number [id number]    PRODUCT CODE E0336V00    Unit Type and Rh 6200    Blood Product Expiration Date [id number]    Blood Product Unit Number [id number]    Unit Type and Rh 6200    Blood Product Expiration Date [id number]   Glucose, capillary     Status: Abnormal   Collection Time: 03/18/16  4:01 PM  Result Value Ref Range   Glucose-Capillary 161 (H) 65 - 99 mg/dL  Prepare RBC     Status: None   Collection Time: 03/18/16  4:45 PM  Result Value Ref Range   Order Confirmation ORDER PROCESSED BY BLOOD BANK   Glucose, capillary     Status: None   Collection Time: 03/18/16  9:10 PM  Result Value Ref Range   Glucose-Capillary 69 65 - 99 mg/dL   Comment 1 Notify RN   Basic metabolic panel     Status: Abnormal   Collection Time: 03/19/16  4:54 AM  Result Value Ref Range   Sodium 140 135 - 145 mmol/L   Potassium 3.8 3.5 - 5.1 mmol/L   Chloride 106 101 - 111 mmol/L   CO2 28 22 - 32 mmol/L   Glucose, Bld 150 (H) 65 - 99 mg/dL   BUN 9 6 - 20 mg/dL   Creatinine, Ser 0.72 0.44 - 1.00 mg/dL   Calcium 7.6 (L) 8.9 - 10.3 mg/dL   GFR calc non Af Amer >60 >60 mL/min   GFR calc Af Amer >60 >60 mL/min    Comment: (NOTE) The eGFR has been calculated using the CKD EPI equation. This calculation has not been validated in all clinical situations. eGFR's persistently <60 mL/min signify possible Chronic Kidney Disease.    Anion gap 6 5 - 15  CBC with Differential/Platelet     Status: Abnormal   Collection Time: 03/19/16  4:54 AM  Result  Value Ref Range   WBC 7.5 3.6 - 11.0 K/uL   RBC 2.97 (L) 3.80 - 5.20 MIL/uL   Hemoglobin 8.3 (L) 12.0 - 16.0 g/dL   HCT 24.6 (L) 35.0 - 47.0 %   MCV 82.7 80.0 - 100.0 fL   MCH 27.8 26.0 - 34.0 pg   MCHC 33.6 32.0 - 36.0 g/dL   RDW 14.7 (H) 11.5 - 14.5 %   Platelets 231 150 - 440 K/uL   Neutrophils Relative % 52 %   Neutro Abs 3.9 1.4 - 6.5 K/uL   Lymphocytes Relative 36 %   Lymphs Abs 2.7 1.0 -  3.6 K/uL   Monocytes Relative 8 %   Monocytes Absolute 0.6 0.2 - 0.9 K/uL   Eosinophils Relative 3 %   Eosinophils Absolute 0.3 0 - 0.7 K/uL   Basophils Relative 1 %   Basophils Absolute 0.1 0 - 0.1 K/uL  Glucose, capillary     Status: Abnormal   Collection Time: 03/19/16  8:00 AM  Result Value Ref Range   Glucose-Capillary 149 (H) 65 - 99 mg/dL  Glucose, capillary     Status: Abnormal   Collection Time: 03/19/16 11:38 AM  Result Value Ref Range   Glucose-Capillary 212 (H) 65 - 99 mg/dL  Glucose, capillary     Status: Abnormal   Collection Time: 03/19/16  4:35 PM  Result Value Ref Range   Glucose-Capillary 120 (H) 65 - 99 mg/dL    Current Facility-Administered Medications  Medication Dose Route Frequency Provider Last Rate Last Dose  . ARIPiprazole (ABILIFY) tablet 5 mg  5 mg Oral Daily Gonzella Lex, MD   5 mg at 03/19/16 1003  . buPROPion (WELLBUTRIN XL) 24 hr tablet 450 mg  450 mg Oral Daily Gonzella Lex, MD   450 mg at 03/19/16 1003  . feeding supplement (GLUCERNA SHAKE) (GLUCERNA SHAKE) liquid 237 mL  237 mL Oral TID BM Srikar Sudini, MD   237 mL at 03/18/16 2033  . fluconazole (DIFLUCAN) tablet 100 mg  100 mg Oral Daily Hillary Bow, MD   100 mg at 03/19/16 1003  . folic acid (FOLVITE) tablet 1 mg  1 mg Oral Daily Srikar Sudini, MD   1 mg at 03/19/16 1003  . insulin aspart (novoLOG) injection 0-15 Units  0-15 Units Subcutaneous TID WC Harrie Foreman, MD   5 Units at 03/19/16 1206  . insulin glargine (LANTUS) injection 8 Units  8 Units Subcutaneous Q24H Hillary Bow, MD    8 Units at 03/19/16 1004  . liver oil-zinc oxide (DESITIN) 40 % ointment   Topical PRN Srikar Sudini, MD      . magnesium oxide (MAG-OX) tablet 400 mg  400 mg Oral Daily Srikar Sudini, MD   400 mg at 03/19/16 1003  . metoCLOPramide (REGLAN) tablet 10 mg  10 mg Oral TID AC & HS Srikar Sudini, MD   10 mg at 03/19/16 1714  . mirtazapine (REMERON SOL-TAB) disintegrating tablet 15 mg  15 mg Oral QHS Clovis Fredrickson, MD   15 mg at 03/18/16 2124  . nystatin (MYCOSTATIN) 100000 UNIT/ML suspension 500,000 Units  5 mL Oral QID Basilio Cairo, NP   500,000 Units at 03/19/16 1714  . ondansetron (ZOFRAN) injection 4 mg  4 mg Intravenous Q6H PRN Hillary Bow, MD   4 mg at 03/17/16 6962  . potassium chloride (KLOR-CON) packet 40 mEq  40 mEq Oral BID Vaughan Basta, MD   40 mEq at 03/19/16 1004  . tiZANidine (ZANAFLEX) tablet 4 mg  4 mg Oral BID Hillary Bow, MD   4 mg at 03/19/16 1003    Musculoskeletal: Strength & Muscle Tone: decreased Gait & Station: unable to stand Patient leans: N/A  Psychiatric Specialty Exam: I reviewed physical examination performed by medical doctor in agreement with the findings.  Physical Exam  Nursing note and vitals reviewed. Psychiatric: Judgment normal. Her affect is inappropriate. Her affect is not blunt. Her speech is delayed. She is slowed and withdrawn. Cognition and memory are normal. She exhibits a depressed mood. She expresses no suicidal ideation. She expresses no suicidal plans.    Review  of Systems  Psychiatric/Behavioral: Positive for depression and suicidal ideas.  All other systems reviewed and are negative.   Blood pressure (!) 128/52, pulse 74, temperature 99.1 F (37.3 C), temperature source Oral, resp. rate 19, height '5\' 3"'$  (1.6 m), weight 73.6 kg (162 lb 4.1 oz), SpO2 94 %.Body mass index is 28.74 kg/m.  General Appearance: Fairly Groomed  Eye Contact:  Good  Speech:  Slow  Volume:  Decreased  Mood:  Depressed  Affect:  Blunt  Thought  Process:  Goal Directed and Descriptions of Associations: Intact  Orientation:  Full (Time, Place, and Person)  Thought Content:  WDL  Suicidal Thoughts:  No  Homicidal Thoughts:  No  Memory:  Immediate;   Fair Recent;   Fair Remote;   Fair  Judgement:  Fair  Insight:  Present  Psychomotor Activity:  Psychomotor Retardation  Concentration:  Concentration: Fair and Attention Span: Fair  Recall:  [employer] of Knowledge:  Fair  Language:  Fair  Akathisia:  No  Handed:  Right  AIMS (if indicated):     Assets:  Communication Skills Desire for Improvement Financial Resources/Insurance Resilience Social Support  ADL's:  Impaired  Cognition:  Impaired,  Mild  Sleep:        Treatment Plan Summary: Daily contact with patient to assess and evaluate symptoms and progress in treatment and Medication management   Ms. Frith is a 71 year old bed-ridden female with a history of depression and multiple medical problems facing placement in a nursing facility.  1. Depression. Patient for whatever reason is much worse today. She is whispering, looks out of that and is voicing suicidal thoughts although she denies any intent. I am going to increase her Wellbutrin to 450 mg a day in the morning, and I am going to add 5 mg of Abilify for treatment of depression. Plan discussed with patient. Supportive counseling. She is agreeable..  2. Placement. The patient seems to understand and accept placement.  3. Debility. Even though depression is likely a contributing factor to worsening of her condition, I do not believe that there would be much change if depression was magically removed.  4. Please contact Dr. Weber Cooks if follow up necessary.  Disposition: No evidence of imminent risk to self or others at present.   Patient does not meet criteria for psychiatric inpatient admission. Supportive therapy provided about ongoing stressors. Discussed crisis plan, support from social network, calling 911,  coming to the Emergency Department, and calling Suicide Hotline.  Alethia Berthold, MD 03/19/2016 7:58 PM

## 2016-03-19 NOTE — Progress Notes (Signed)
Inpatient Diabetes Program Recommendations  AACE/ADA: New Consensus Statement on Inpatient Glycemic Control (2015)  Target Ranges:  Prepandial:   less than 140 mg/dL      Peak postprandial:   less than 180 mg/dL (1-2 hours)      Critically ill patients:  140 - 180 mg/dL   Lab Results  Component Value Date   GLUCAP 149 (H) 03/19/2016   HGBA1C 9.1 (H) 08/06/2015    Review of Glycemic Control  Results for Sylvia Black, Tieshia P (MRN [id number]) as of 03/19/2016 11:01  Ref. Range 03/18/2016 07:57 03/18/2016 11:16 03/18/2016 16:01 03/18/2016 21:10 03/19/2016 08:00  Glucose-Capillary Latest Ref Range: 65 - 99 mg/dL 409105 (H)  No Novolog 811140 (H)  Novolog 2 units 161 (H)  Novolog 3 units 69  149 (H)    Outpatient Diabetes medications: Lantus 14 units QHS, Novolog 4 units TID with meals (home med list notes patient is not taking Novolog)  Current orders for Inpatient glycemic control: Lantus 8 units Q24H, Novolog 0-15 units TID with meals  Inpatient Diabetes Program Recommendations: Low blood sugar last evening after receiving Lantus 11 units and Novolog correction at lunch and supper.  Agree with current medications for blood sugar management as Lantus has been decreased to 8 units today.    Per ADA recommendations "consider performing an A1C on all patients with diabetes or hyperglycemia admitted to the hospital if not performed in the prior 3 months".  Susette RacerJulie Caitrin Pendergraph, RN, BA, MHA, CDE Diabetes Coordinator Inpatient Diabetes Program  [phone] (Team Pager) [phone] [facility] Office) 03/19/2016 11:05 AM

## 2016-03-19 NOTE — Care Management Note (Addendum)
Case Management Note  Patient Details  Name: Philippa Chestereresa P Gartman MRN: [id number] Date of Birth: 11/02/1945  Subjective/Objective:  RNCM consult received for home hospice arrangements. Spoke with daughter, Aggie CosierCrystal. She will take patient home with her. She is agreeable with hospice at home. Offered choice of agencies and she chose [facility]. Referral called to Dayna BarkerKaren Robertson, [facility] liaison. Daughter reports patient has a hospital bed, hoyer lift,  wheelchair so no DME needed. Barbara CowerJason with advanced notified of change in POC.                 Action/Plan:   Expected Discharge Date:                  Expected Discharge Plan:  Home w Hospice Care  In-House Referral:  Clinical Social Work  Discharge planning Services  CM Consult  Post Acute Care Choice:  Hospice Choice offered to:  Adult Children  DME Arranged:    DME Agency:     HH Arranged:  Disease Management HH Agency:  [facility]  Status of Service:  In process, will continue to follow  If discussed at Long Length of Stay Meetings, dates discussed:    Additional Comments:  Marily MemosLisa M Tashea Othman, RN 03/19/2016, 10:46 AM

## 2016-03-20 LAB — GLUCOSE, CAPILLARY
GLUCOSE-CAPILLARY: 69 mg/dL (ref 65–99)
GLUCOSE-CAPILLARY: 94 mg/dL (ref 65–99)
Glucose-Capillary: 151 mg/dL — ABNORMAL HIGH (ref 65–99)
Glucose-Capillary: 130 mg/dL — ABNORMAL HIGH (ref 65–99)
Glucose-Capillary: 101 mg/dL — ABNORMAL HIGH (ref 65–99)

## 2016-03-20 LAB — TYPE AND SCREEN
ABO/RH(D): A POS
Antibody Screen: NEGATIVE
UNIT DIVISION: 0
Unit division: 0
Unit division: 0

## 2016-03-20 LAB — BPAM RBC
BLOOD PRODUCT EXPIRATION DATE: 201803232359
Blood Product Expiration Date: 201803232359
Blood Product Expiration Date: 201803252359
ISSUE DATE / TIME: 201802261713
UNIT TYPE AND RH: 6200
UNIT TYPE AND RH: 6200
UNIT TYPE AND RH: 6200

## 2016-03-20 LAB — PREPARE RBC (CROSSMATCH)

## 2016-03-20 MED ORDER — INSULIN GLARGINE 100 UNIT/ML SOLOSTAR PEN: 10.0000 [IU] | PEN_INJECTOR | Freq: Every day | SUBCUTANEOUS | 11 refills | Status: AC

## 2016-03-20 MED ORDER — OLANZAPINE 10 MG PO TBDP
10.0000 mg | ORAL_TABLET | Freq: Every day | ORAL | Status: DC
  Administered 2016-03-20: 10 mg via ORAL
  Filled 2016-03-20 (×2): qty 1

## 2016-03-20 MED ORDER — ACETAMINOPHEN 325 MG PO TABS
650.0000 mg | ORAL_TABLET | Freq: Four times a day (QID) | ORAL | Status: DC | PRN
  Administered 2016-03-20: 650 mg via ORAL
  Filled 2016-03-20: qty 2

## 2016-03-20 MED ORDER — MIRTAZAPINE 15 MG PO TBDP: 15.0000 mg | ORAL_TABLET | Freq: Every day | ORAL | 0 refills | Status: AC

## 2016-03-20 MED ORDER — MIRTAZAPINE 15 MG PO TBDP
30.0000 mg | ORAL_TABLET | Freq: Every day | ORAL | Status: DC
  Administered 2016-03-20: 30 mg via ORAL
  Filled 2016-03-20: qty 2

## 2016-03-20 NOTE — Care Management Important Message (Signed)
Important Message  Patient Details  Name: Sylvia Black MRN: [id number] Date of Birth: 09/03/1945   Medicare Important Message Given:  Yes    [name], RN 03/20/2016, 8:33 AM

## 2016-03-20 NOTE — Plan of Care (Signed)
Problem: Skin Integrity: Goal: Risk for impaired skin integrity will decrease Outcome: Progressing Patient is compliant with q2 hour turning, frequent pericare and ensuring she is clean, dry and comfortable.

## 2016-03-20 NOTE — Discharge Instructions (Addendum)
Regular diet  Activity as tolerated  Sliding scale insulin CBG 121 - 150: 1 units   CBG 151 - 200: 2 units   CBG 201 - 250: 3 units   CBG 251 - 300: 5 units   CBG 301 - 350: 7 units   CBG 351 - 400: 9 units

## 2016-03-20 NOTE — Consult Note (Signed)
[facility] Face-to-Face Psychiatry Consult   Reason for Consult:  Depression. Referring Physician:  Dr. Elpidio Anis Patient Identification: Sylvia Black MRN:  [id number] Principal Diagnosis: Diabetic ketoacidosis without coma associated with type 2 diabetes mellitus [facility]) Diagnosis:   Patient Active Problem List   Diagnosis Date Noted  . Severe protein-calorie malnutrition (HCC) [E43]   . Palliative care by specialist [Z51.5]   . Encounter for hospice care discussion [Z71.89]   . Pressure injury of skin [L89.90] 03/15/2016  . AKI (acute kidney injury) (HCC) [N17.9] 03/14/2016  . HLD (hyperlipidemia) [E78.5] 01/20/2016  . GERD (gastroesophageal reflux disease) [K21.9] 01/20/2016  . Anxiety [F41.9] 01/20/2016  . DKA (diabetic ketoacidoses) (HCC) [E13.10] 01/20/2016  . 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 Altered mental status [R41.82]   . Diabetic ketoacidosis without coma associated with type 2 diabetes mellitus (HCC) [E13.10]   . Encounter for central line placement [Z45.2]   . Acidosis [E87.2] 07/01/2015  . Stroke [facility]) [I63.9]   . Hypertension [I10]   . Frequent UTI [N39.0]   . Diabetes mellitus without complication (HCC) [E11.9]   . Hypoglycemia [E16.2] 02/21/2013    Total Time spent with patient: 15 minutes  Subjective:    Follow-up for Wednesday the 28th. Patient seen. Chart reviewed. Patient's sister was in the room as well. Patient appears significantly worse. She was nonverbal and completely unresponsive to me. Sister reports that she had not spoken and had been unresponsive this afternoon as well. Patient does not seem to be eating or taking her medicine as of this afternoon. Eyes were open but she was very blank and unresponsive.  Identifying data. Ms. Sylvia Black is a 71 y.o. female with a history of depression and multiple medical problems.  Chief complaint. "I am better."  History of present illness. Information was obtained from  the patient and the chart. Apparently the patient has a history of depression that has been maintained with Wellbutrin prescribed by her primary care provider. The patient was admitted to medical floor for DKA, UTI, and poor oral intake. She is bedbound. My understanding is that the family is no longer able to take care of this patient and placement in a nursing home is being attempted. There is some thinking that her debility may be related to depression. The patient is very pleasant and polite on interview. She reports that she is feeling a little better today. She initially denies any symptoms of depression, anxiety, or psychosis but admits that she feels sad "a little" and that she cries "a little". She reports poor sleep and absolutely no appetite. Her daughter believes that the mother is stubborn and refuses to cooperate. The patient was seen in consultation by Dr. Toni Amend in June 2017. At that time the patient denied depressive symptoms and was continued on Wellbutrin. I spoke with the patient about the plan to place her in a nursing facility. She seems to be aware of it and accepting. She has very limited social interaction. She has had a very hard time adjusting to her widowhood after her husband passed away 5 years ago. She has no interest in any activities, not even watching TV. She never gets out of the house except for doctor's appointments as she is truly bedbound. She adamantly denies any suicidal thoughts, intention or plans. There are no alcohol or substances involved.  Past psychiatric history. She has been treated for depression by her primary providers with Wellbutrin. She was hospitalized in a mental hospital many  years ago. She never attempted suicide.  Family psychiatry history. She believes that there are multiple family members with emotional problems.  Social history. For the past five years, since her husband's death, she has been living with her daughter and her grandson. This  arrangement would no longer be possible and the patient will be placed in a nursing home.  Risk to Self: Is patient at risk for suicide?: No Risk to Others:   Prior Inpatient Therapy:   Prior Outpatient Therapy:    Past Medical History:  Past Medical History:  Diagnosis Date  . Anxiety   . Bipolar disorder (HCC)   . Bullous pemphigoid   . 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    "left; see pain dr for that" (02/22/2013)  . Depression   . Family history of anesthesia complication    "daughter's w/PONV" (02/22/2013)  . Frequent UTI   . GERD (gastroesophageal reflux disease)   . Gout attack    "twice" (02/22/2013)  . High cholesterol   . Hypertension   . Pneumonia 1990's   "once"  . PONV (postoperative nausea and vomiting)    "makes me violently ill" (02/22/2013)  . Sleep apnea    "dx'd in the 1990's; don't wear mask" (02/22/2013)  . Stroke [facility]) 2010; 02/2012   denies residual; "balance issues since & weak on left side since" (02/22/2013)  . Type II diabetes mellitus (HCC)   . Vertigo     Past Surgical History:  Procedure Laterality Date  . ANTERIOR CERVICAL DECOMP/DISCECTOMY FUSION  1989  . APPENDECTOMY  1984  . CATARACT EXTRACTION W/ INTRAOCULAR LENS  IMPLANT, BILATERAL Bilateral ~ 2008  . CHOLECYSTECTOMY  1984  . POSTERIOR FUSION CERVICAL SPINE  ~ 1988  . TUBAL LIGATION  1973  . UPPER ESOPHAGEAL ENDOSCOPIC ULTRASOUND (EUS) N/A 11/09/2015   Procedure: UPPER ESOPHAGEAL ENDOSCOPIC ULTRASOUND (EUS);  Surgeon: Rayann Heman, MD;  Location[facility] ENDOSCOPY;  Service: Endoscopy;  Laterality: N/A;  . WRIST SURGERY Right ~ 1985   "tied off damagaed nerve" (02/22/2013)   Family History:  Family History  Problem Relation Age of Onset  . Heart disease Mother   . Diabetes Mother   . Heart disease Father     Social History:  History  Alcohol Use  . Yes    Comment: 02/22/2013 "mixed drink on special occasions; glass of wine q 2-3 months"     History  Drug Use No    Social History   Social History   . Marital status: Widowed    Spouse name: N/A  . Number of children: N/A  . Years of education: N/A   Social History Main Topics  . Smoking status: Passive Smoke Exposure - Never Smoker  . Smokeless tobacco: Never Used  . Alcohol use Yes     Comment: 02/22/2013 "mixed drink on special occasions; glass of wine q 2-3 months"  . Drug use: No  . Sexual activity: Not Asked   Other Topics Concern  . None   Social History Narrative  . None   Additional Social History:    Allergies:   Allergies  Allergen Reactions  . Ciprofloxacin     Other reaction(s): Hallucinations  . Latex   . Penicillins Hives    Has patient had a PCN reaction causing immediate rash, facial/tongue/throat swelling, SOB or lightheadedness with hypotension: Yes Has patient had a PCN reaction causing severe rash involving mucus membranes or skin necrosis: No Has patient had a PCN reaction that required hospitalization Yes Has patient  had a PCN reaction occurring within the last 10 years: No If all of the above answers are "NO", then may proceed with Cephalosporin use.  . Sulfa Antibiotics     "hives"  . Tetanus Toxoids     "I had a strong reaction".    Labs:  Results for orders placed or performed during the hospital encounter of 03/14/16 (from the past 48 hour(s))  Glucose, capillary     Status: None   Collection Time: 03/18/16  9:10 PM  Result Value Ref Range   Glucose-Capillary 69 65 - 99 mg/dL   Comment 1 Notify RN   Basic metabolic panel     Status: Abnormal   Collection Time: 03/19/16  4:54 AM  Result Value Ref Range   Sodium 140 135 - 145 mmol/L   Potassium 3.8 3.5 - 5.1 mmol/L   Chloride 106 101 - 111 mmol/L   CO2 28 22 - 32 mmol/L   Glucose, Bld 150 (H) 65 - 99 mg/dL   BUN 9 6 - 20 mg/dL   Creatinine, Ser 0.72 0.44 - 1.00 mg/dL   Calcium 7.6 (L) 8.9 - 10.3 mg/dL   GFR calc non Af Amer >60 >60 mL/min   GFR calc Af Amer >60 >60 mL/min    Comment: (NOTE) The eGFR has been calculated using  the CKD EPI equation. This calculation has not been validated in all clinical situations. eGFR's persistently <60 mL/min signify possible Chronic Kidney Disease.    Anion gap 6 5 - 15  CBC with Differential/Platelet     Status: Abnormal   Collection Time: 03/19/16  4:54 AM  Result Value Ref Range   WBC 7.5 3.6 - 11.0 K/uL   RBC 2.97 (L) 3.80 - 5.20 MIL/uL   Hemoglobin 8.3 (L) 12.0 - 16.0 g/dL   HCT 24.6 (L) 35.0 - 47.0 %   MCV 82.7 80.0 - 100.0 fL   MCH 27.8 26.0 - 34.0 pg   MCHC 33.6 32.0 - 36.0 g/dL   RDW 14.7 (H) 11.5 - 14.5 %   Platelets 231 150 - 440 K/uL   Neutrophils Relative % 52 %   Neutro Abs 3.9 1.4 - 6.5 K/uL   Lymphocytes Relative 36 %   Lymphs Abs 2.7 1.0 - 3.6 K/uL   Monocytes Relative 8 %   Monocytes Absolute 0.6 0.2 - 0.9 K/uL   Eosinophils Relative 3 %   Eosinophils Absolute 0.3 0 - 0.7 K/uL   Basophils Relative 1 %   Basophils Absolute 0.1 0 - 0.1 K/uL  Glucose, capillary     Status: Abnormal   Collection Time: 03/19/16  8:00 AM  Result Value Ref Range   Glucose-Capillary 149 (H) 65 - 99 mg/dL  Glucose, capillary     Status: Abnormal   Collection Time: 03/19/16 11:38 AM  Result Value Ref Range   Glucose-Capillary 212 (H) 65 - 99 mg/dL  Glucose, capillary     Status: Abnormal   Collection Time: 03/19/16  4:35 PM  Result Value Ref Range   Glucose-Capillary 120 (H) 65 - 99 mg/dL  Glucose, capillary     Status: Abnormal   Collection Time: 03/19/16  8:48 PM  Result Value Ref Range   Glucose-Capillary 134 (H) 65 - 99 mg/dL   Comment 1 Notify RN   Glucose, capillary     Status: Abnormal   Collection Time: 03/20/16  7:54 AM  Result Value Ref Range   Glucose-Capillary 151 (H) 65 - 99 mg/dL   Comment 1  Notify RN    Comment 2 Document in Chart   Glucose, capillary     Status: Abnormal   Collection Time: 03/20/16 11:29 AM  Result Value Ref Range   Glucose-Capillary 130 (H) 65 - 99 mg/dL   Comment 1 Notify RN    Comment 2 Document in Chart   Glucose,  capillary     Status: None   Collection Time: 03/20/16  4:32 PM  Result Value Ref Range   Glucose-Capillary 69 65 - 99 mg/dL   Comment 1 Notify RN    Comment 2 Document in Chart   Glucose, capillary     Status: None   Collection Time: 03/20/16  5:39 PM  Result Value Ref Range   Glucose-Capillary 94 65 - 99 mg/dL    Current Facility-Administered Medications  Medication Dose Route Frequency Provider Last Rate Last Dose  . acetaminophen (TYLENOL) tablet 650 mg  650 mg Oral Q6H PRN Hillary Bow, MD   650 mg at 03/20/16 1559  . buPROPion (WELLBUTRIN XL) 24 hr tablet 450 mg  450 mg Oral Daily Gonzella Lex, MD   450 mg at 03/20/16 0850  . feeding supplement (GLUCERNA SHAKE) (GLUCERNA SHAKE) liquid 237 mL  237 mL Oral TID BM Srikar Sudini, MD   237 mL at 03/20/16 1055  . fluconazole (DIFLUCAN) tablet 100 mg  100 mg Oral Daily Hillary Bow, MD   100 mg at 03/20/16 0850  . folic acid (FOLVITE) tablet 1 mg  1 mg Oral Daily Hillary Bow, MD   1 mg at 03/20/16 0850  . insulin aspart (novoLOG) injection 0-15 Units  0-15 Units Subcutaneous TID WC Harrie Foreman, MD   2 Units at 03/20/16 1241  . insulin glargine (LANTUS) injection 8 Units  8 Units Subcutaneous Q24H Hillary Bow, MD   8 Units at 03/20/16 [phone]  . liver oil-zinc oxide (DESITIN) 40 % ointment   Topical PRN Srikar Sudini, MD      . magnesium oxide (MAG-OX) tablet 400 mg  400 mg Oral Daily Hillary Bow, MD   400 mg at 03/20/16 0851  . metoCLOPramide (REGLAN) tablet 10 mg  10 mg Oral TID AC & HS Hillary Bow, MD   10 mg at 03/20/16 0850  . mirtazapine (REMERON SOL-TAB) disintegrating tablet 30 mg  30 mg Oral QHS Gonzella Lex, MD      . nystatin (MYCOSTATIN) 100000 UNIT/ML suspension 500,000 Units  5 mL Oral QID Basilio Cairo, NP   500,000 Units at 03/20/16 0851  . OLANZapine zydis (ZYPREXA) disintegrating tablet 10 mg  10 mg Oral QHS Gonzella Lex, MD      . ondansetron [facility]) injection 4 mg  4 mg Intravenous Q6H PRN Hillary Bow, MD   4 mg at 03/17/16 0272  . potassium chloride (KLOR-CON) packet 40 mEq  40 mEq Oral BID Vaughan Basta, MD   40 mEq at 03/19/16 1004    Musculoskeletal: Strength & Muscle Tone: decreased Gait & Station: unable to stand Patient leans: N/A  Psychiatric Specialty Exam: I reviewed physical examination performed by medical doctor in agreement with the findings.  Physical Exam  Nursing note and vitals reviewed. Psychiatric: Her affect is inappropriate. Her affect is not blunt. Her speech is delayed. She is slowed and withdrawn. Cognition and memory are impaired. She expresses inappropriate judgment. She exhibits a depressed mood. She expresses no suicidal ideation. She expresses no suicidal plans. She exhibits abnormal recent memory.    Review of Systems  Unable to perform ROS: Patient nonverbal  Psychiatric/Behavioral: Positive for depression and suicidal ideas.  All other systems reviewed and are negative.   Blood pressure (!) 108/30, pulse 73, temperature 99.4 F (37.4 C), temperature source Axillary, resp. rate 18, height '5\' 3"'$  (1.6 m), weight 73.6 kg (162 lb 4.1 oz), SpO2 96 %.Body mass index is 28.74 kg/m.  General Appearance: Disheveled  Eye Contact:  Good  Speech:  Slow  Volume:  Decreased  Mood:  Depressed  Affect:  Blunt  Thought Process:  NA  Orientation:  Negative  Thought Content:  Negative  Suicidal Thoughts:  No  Homicidal Thoughts:  No  Memory:  Immediate;   Negative Recent;   Negative Remote;   Negative  Judgement:  Impaired  Insight:  Lacking  Psychomotor Activity:  Psychomotor Retardation  Concentration:  Concentration: Negative and Attention Span: Negative  Recall:  Negative  Fund of Knowledge:  Negative  Language:  Negative  Akathisia:  No  Handed:  Right  AIMS (if indicated):     Assets:  Communication Skills Desire for Improvement Financial Resources/Insurance Resilience Social Support  ADL's:  Impaired  Cognition:  Impaired,   Mild  Sleep:        Treatment Plan Summary: Follow-up for Wednesday the 28th. Patient appears to be significantly worse. Not clear if any of this is behavioral or if it is medical or if it is depression getting worse. Sr. says she is understanding that the patient is likely to be discharged today although in her current condition it's unclear how she could be cared for at home. I have made some changes to medication to allow more likelihood of getting medicine into her. Discontinue the Abilify and start Zyprexa dissolving tablets at night and increased dissolving mirtazapine to 30 mg at night. I will follow-up as needed.  Disposition: No evidence of imminent risk to self or others at present.   Patient does not meet criteria for psychiatric inpatient admission. Supportive therapy provided about ongoing stressors. Discussed crisis plan, support from social network, calling 911, coming to the Emergency Department, and calling Suicide Hotline.  Alethia Berthold, MD 03/20/2016 5:55 PM

## 2016-03-20 NOTE — Progress Notes (Signed)
Follow up visit to new referral for [facility] services at home following discharge. Patient seen, responded verbally to interactions, requested water, given. Patient noted to cough after drinking. Breakfast tray taken prior to visit, patient hd eaten only bites. CMRN Marylene Landngela made aware that writer is still awaiting a return call from patient's daughter as no discussion has been had with her regarding hospice services. Will continue to attempt to contact [name]. Thank you. Dayna BarkerKaren Robertson RN, BSN, [facility], hospital liaison [phone]

## 2016-03-20 NOTE — Progress Notes (Signed)
Phone call to patient's daughter [name]. She did acknowledge receiving writer's phone call yesterday. Hospice services explained, [name] voiced her understanding. Possible discharge today. Patient will need EMS transport. [name] would like to speak with patient's attending, Dr. Elpidio AnisSudini aware. CMRN Marylene Landngela also made aware Clinical research associatewriter had spoken to [employer] CorporationChrystal. Thank you.  Dayna BarkerKaren Robertson RN, BSN, [facility], hospital Liaison [phone] c

## 2016-03-20 NOTE — Plan of Care (Signed)
Problem: Nutrition: Goal: Adequate nutrition will be maintained Outcome: Not Progressing Patient ate only 2 bites of scrambled egg for breakfast, and refused to eat any lunch.  Will continue to try to coax patient to eat.

## 2016-03-20 NOTE — Progress Notes (Signed)
Spoke with Dr. Elpidio AnisSudini regarding patient's elevated temperature.  New orders entered and tylenol given.

## 2016-03-21 LAB — GLUCOSE, CAPILLARY
GLUCOSE-CAPILLARY: 120 mg/dL — AB (ref 65–99)
GLUCOSE-CAPILLARY: 164 mg/dL — AB (ref 65–99)
Glucose-Capillary: 133 mg/dL — ABNORMAL HIGH (ref 65–99)

## 2016-03-21 MED ORDER — OXYCODONE HCL 5 MG PO TABS: 5.0000 mg | ORAL_TABLET | ORAL | 0 refills | Status: AC | PRN

## 2016-03-21 MED ORDER — OLANZAPINE 10 MG PO TBDP: 10.0000 mg | ORAL_TABLET | Freq: Every day | ORAL | 0 refills | Status: AC

## 2016-03-21 NOTE — Progress Notes (Addendum)
Advance care planning  Discussed with daughter over the phone. She confirms her mother did not want to be intubated or resuscitated with CPR. DNR/DNI. She is unsure about a PEG tube as this would be artifical feeding. I have explained her poor appetite is unlikely to be reversed and is only 1 part of her progressive decline. malnutrition is contributing but not the only reason for decline. She wants to discuss further with her mom, sister and let me know about a PEG tube.  Time spent 20 minutes

## 2016-03-21 NOTE — Progress Notes (Signed)
Per conversation with MD, ok to hold all medications r/t patient vomiting, with very little intake. Will continue to monitor. Spoke with daughter regarding discharge plans, awaiting CM for arrangements.

## 2016-03-21 NOTE — Progress Notes (Signed)
Follow up visit made. Patient lying in bed, alert and oriented, much more interactive today. Continues with poor appetite. Plan is for discharge home today. Staff RN Victorino DikeJennifer has spoken to patient's daughter Marena ChancyChyrstal to notify her. Patient to transport via EMS. Discharge information faxed to hospice referral. Signed portable DNR in place in discharge packet. Thank you. Dayna BarkerKaren Robertson RN, BSN, [facility] [place], hospital Liaison [phone] c

## 2016-03-21 NOTE — Progress Notes (Signed)
Conversation with daughter Grover CanavanKrystal to inform of discharge, transport by EMS, verbal agreement of grandson to review AVS.

## 2016-03-21 NOTE — Progress Notes (Signed)
[employer] [facility] [place] [facility]   PATIENT NAME: Sylvia Black    MR#:  [id number]  DATE OF BIRTH:  11/21/1945  SUBJECTIVE:  CHIEF COMPLAINT:   Chief Complaint  Patient presents with  . Hyperglycemia   Admitted with DKA, found to have UTI. DKA has resolved Not speaking much. Flat affect. <25% PO intake  REVIEW OF SYSTEMS:    Review of Systems  Unable to perform ROS: Dementia    DRUG ALLERGIES:   Allergies  Allergen Reactions  . Ciprofloxacin     Other reaction(s): Hallucinations  . Latex   . Penicillins Hives    Has patient had a PCN reaction causing immediate rash, facial/tongue/throat swelling, SOB or lightheadedness with hypotension: Yes Has patient had a PCN reaction causing severe rash involving mucus membranes or skin necrosis: No Has patient had a PCN reaction that required hospitalization Yes Has patient had a PCN reaction occurring within the last 10 years: No If all of the above answers are "NO", then may proceed with Cephalosporin use.  . Sulfa Antibiotics     "hives"  . Tetanus Toxoids     "I had a strong reaction".    VITALS:  Blood pressure (!) 164/66, pulse 78, temperature 98.3 F (36.8 C), temperature source Oral, resp. rate 19, height 5\' 3"  (1.6 m), weight 73.6 kg (162 lb 4.1 oz), SpO2 98 %.  PHYSICAL EXAMINATION:  GENERAL:  71 y.o.-year-old patient lying in the bed with no acute distress.  EYES: Pupils equal, round, reactive to light and accommodation. No scleral icterus. Extraocular muscles intact.  HEENT: Head atraumatic, normocephalic. Oropharynx and nasopharynx clear.  NECK:  Supple, no jugular venous distention. No thyroid enlargement, no tenderness.  LUNGS: Normal breath sounds bilaterally, no wheezing, rales,rhonchi or crepitation. No use of accessory muscles of respiration.  CARDIOVASCULAR: S1, S2 normal. No murmurs, rubs, or gallops.  ABDOMEN: Soft, nontender, nondistended. Bowel sounds present. No organomegaly or  mass.  EXTREMITIES: edema in all extremities. Bruising NEUROLOGIC: Cranial nerves II through XII are intact. Motor 1/5 in lower extremities and 3/5 in upper extremities Sensation intact. Gait not checked.  PSYCHIATRIC: The patient is alert and awake. Flat affect SKIN: No obvious rash, lesion, or ulcer.   Physical Exam LABORATORY PANEL:   CBC  Recent Labs Lab 03/19/16 0454  WBC 7.5  HGB 8.3*  HCT 24.6*  PLT 231   ------------------------------------------------------------------------------------------------------------------  Chemistries   Recent Labs Lab 03/17/16 0334 03/18/16 0401 03/19/16 0454  NA 141 140 140  K 4.1 3.6 3.8  CL 109 108 106  CO2 26 26 28   GLUCOSE 123* 123* 150*  BUN 14 9 9   CREATININE 0.72 0.77 0.72  CALCIUM 7.1* 7.4* 7.6*  MG 2.3  --   --   AST  --  37  --   ALT  --  11*  --   ALKPHOS  --  135*  --   BILITOT  --  0.5  --    ------------------------------------------------------------------------------------------------------------------  Cardiac Enzymes No results for input(s): TROPONINI in the last 168 hours. ------------------------------------------------------------------------------------------------------------------  RADIOLOGY:  No results found.  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 Transfused 1 unit and Hb improved  * chronic vomiting likely gastroparesis On PO Reglan. Discussed with GI. Had EGD  in the past. Treat symptomatically with Reglan.  * DKA, resolved Uncontrolled diabetes with hypo-and hyperglycemic episodes On lantus and SSI. Reduced dose of Lantus to 8 units   * UTI Yeast in the urine. Started fluconazole. Stopped ceftriaxone.  * Hypokalemia  Replaced and now normal  *  Hypertension   Cont home meds.  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 Started Glucerna. Albumin IV. Received 3 doses to help with anasarca            All the records are reviewed and case discussed with Care Management/Social Worker Management plans discussed with the patient, family and they are in agreement.  CODE STATUS: DNR  TOTAL TIME TAKING CARE OF THIS PATIENT: 35 minutes.   POSSIBLE D/C IN 1-2 DAYS, DEPENDING ON CLINICAL CONDITION.  Milagros Loll R M.D on 03/21/2016   Between 7am to 6pm - Pager - [phone]  After 6pm go to [email or web address] - password [email or web address]  [employer] [facility] [facility]  Office  [phone]  CC: Primary care physician; Emogene Morgan, MD  Note: This dictation was prepared with Dragon dictation along with smaller phrase technology. Any transcriptional errors that result from this process are unintentional.

## 2016-03-21 NOTE — Care Management (Signed)
EMS packet completed with DNR form. RN to call EMS. [facility] patient is ready for patient to discharge to home today. No O2 needed. Clydie BraunKaren has talked to daughter [name] and so has Dr. Elpidio AnisSudini. Per RN daughter Judeth CornfieldStephanie has called with questions about hospice. This Clinical research associatewriter spoke with [name] by phone [phone]. She states she will update Stephanie. [name] works from home and will be there to receive patient. She had not questions. No further RNCM needs.

## 2016-03-21 NOTE — Progress Notes (Signed)
Called EMS for transport. 

## 2016-03-27 NOTE — Discharge Summary (Signed)
[facility] [place] at [facility]   PATIENT NAME: Sylvia Black    MR#:  [id number]  DATE OF BIRTH:  [birth year]  DATE OF ADMISSION:  03/14/2016 ADMITTING PHYSICIAN: Oralia Manis, MD  DATE OF DISCHARGE: 03/21/2016  2:18 PM  PRIMARY CARE PHYSICIAN: Emogene Morgan, MD   ADMISSION DIAGNOSIS:  Lower urinary tract infectious disease [N39.0] Diabetic ketoacidosis without coma associated with type 2 diabetes mellitus (HCC) [E13.10]  DISCHARGE DIAGNOSIS:  Principal Problem:   Diabetic ketoacidosis without coma associated with type 2 diabetes mellitus (HCC) Active Problems:   Hypertension   Frequent UTI   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 Anxiety   . Bipolar disorder (HCC)   . Bullous pemphigoid   . 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    "left; see pain dr for that" (02/22/2013)  . Depression   . Family history of anesthesia complication    "daughter's w/PONV" (02/22/2013)  . Frequent UTI   . GERD (gastroesophageal reflux disease)   . Gout attack    "twice" (02/22/2013)  . High cholesterol   . Hypertension   . Pneumonia 1990's   "once"  . PONV (postoperative nausea and vomiting)    "makes me violently ill" (02/22/2013)  . Sleep apnea    "dx'd in the 1990's; don't wear mask" (02/22/2013)  . Stroke [facility]) 2010; 02/2012   denies residual; "balance issues since & weak on left side since" (02/22/2013)  . Type II diabetes mellitus (HCC)   . Vertigo      ADMITTING HISTORY  HISTORY OF PRESENT ILLNESS:  Sylvia Black  is a 71 y.o. female who presents with Malaise and nausea for the past several days. Patient is  unable to contribute significantly to her history of present illness and she is a very poor historian. Here in the ED she was found to be in mild DKA, and has a UTI. Hospitals were called for admission.   HOSPITAL COURSE:    * Anemia Acute on chronic Likely iron deficiency. Transfused 1 unit and Hb improved Hemoglobin remained stable. No plans for EGD or colonoscopy. No blood in stool found.  * chronic vomiting likely gastroparesis On PO Reglan. Discussed with GI. Had EGD in the past. Treat symptomatically with Reglan.  * DKA, resolved Uncontrolled diabetes with hypo-and hyperglycemic episodes On lantus and SSI. Reduced dose of Lantus to 8 units. NovoLog sliding scale  * UTI Yeast in the urine. Started fluconazole. Finished 1 week in the hospital Stopped ceftriaxone.  * Hypokalemia  Replaced and now normal  *  Hypertension   Cont home meds.  * Hypomagnesemia Repalced    * uterine/cervical mass on CT scan Repeat CT scan shows stable mass  * Severe depression Consulted psychiatry Appreciate input Started Remeron  *  Anasarca with severe hypoalbuminemia due to severe protein and calorie malnutrition Dietary consult. Started Glucerna. Albumin IV. Received 3 doses to help with anasarca  Palliative care was consulted due to patient's poor oral intake and worsening dehydration. I also discussed the case with patient's daughter and healthcare power of attorney Gweneth Dimitri. Patient is DO NOT RESUSCITATE. Patient  with poor oral intake and loss of appetite has had progressive decline over the past few months. On discussing regarding the PEG tube, daughter has decided against this. Also I don't think PEG tube would help with gastroparesis at this point due to chronic vomiting loss of appetite. Overall poor prognosis. Patient is being discharged home with hospice services. She will be transitioned to comfort care if there is any further worsening.  CONSULTS  OBTAINED:  Treatment Team:  Shari ProwsJolanta B Pucilowska, MD Rohini Alger Memoseddy Vanga, MD Audery AmelJohn T Clapacs, MD  DRUG ALLERGIES:   Allergies  Allergen Reactions  . Ciprofloxacin     Other reaction(s): Hallucinations  . Latex   . Penicillins Hives    Has patient had a PCN reaction causing immediate rash, facial/tongue/throat swelling, SOB or lightheadedness with hypotension: Yes Has patient had a PCN reaction causing severe rash involving mucus membranes or skin necrosis: No Has patient had a PCN reaction that required hospitalization Yes Has patient had a PCN reaction occurring within the last 10 years: No If all of the above answers are "NO", then may proceed with Cephalosporin use.  . Sulfa Antibiotics     "hives"  . Tetanus Toxoids     "I had a strong reaction".    DISCHARGE MEDICATIONS:   Discharge Medication List as of 03/21/2016 11:59 AM    START taking these medications   Details  feeding supplement, GLUCERNA SHAKE, (GLUCERNA SHAKE) LIQD Take 237 mLs by mouth 3 (three) times daily between meals., Starting Tue 03/19/2016, Print    mirtazapine (REMERON SOL-TAB) 15 MG disintegrating tablet Take 1 tablet (15 mg total) by mouth at bedtime., Starting Wed 03/20/2016, Print    OLANZapine zydis (ZYPREXA) 10 MG disintegrating tablet Take 1 tablet (10 mg total) by mouth at bedtime., Starting Thu 03/21/2016, Normal    oxyCODONE (ROXICODONE) 5 MG immediate release tablet Take 1 tablet (5 mg total) by mouth every 4 (four) hours as needed for severe pain., Starting Thu 03/21/2016, Print      CONTINUE these medications which have CHANGED   Details  Insulin Glargine (LANTUS) 100 UNIT/ML Solostar Pen Inject 10 Units into the skin at bedtime., Starting Wed 03/20/2016, No Print      CONTINUE these medications which have NOT CHANGED   Details  acetaminophen (TYLENOL) 325 MG tablet Take 2 tablets (650 mg total) by mouth every 6 (six) hours as needed for mild pain, fever or headache., Starting Wed 01/24/2016,  Normal    ammonium lactate (LAC-HYDRIN) 12 % lotion Apply twice a day to feet bilaterally, Print    buPROPion (WELLBUTRIN XL) 150 MG 24 hr tablet Take 150 mg by mouth 2 (two) times daily. , Historical Med    diclofenac sodium (VOLTAREN) 1 % GEL Apply topically 4 (four) times daily., Historical Med    lidocaine (LIDODERM) 5 % Place 1 patch onto the skin daily. Remove & Discard patch within 12 hours or as directed by MD, Historical Med    metoCLOPramide (REGLAN) 10 MG tablet Take 1 tablet (10 mg total) by mouth 4 (four) times daily -  before meals and at bedtime., Starting Wed 01/24/2016, Normal    ondansetron (ZOFRAN-ODT) 4 MG disintegrating tablet Take 4 mg by mouth 3 (three) times daily as needed., Starting Wed 03/06/2016, Historical Med    tiZANidine (ZANAFLEX) 4 MG tablet Take 4 mg by mouth 2 (two) times daily., Starting Fri 12/30/2014, Historical Med    insulin aspart (NOVOLOG) 100 UNIT/ML FlexPen Inject 4 Units into the skin  3 (three) times daily with meals., Starting Wed 08/09/2015, Normal    Insulin Pen Needle (CARETOUCH PEN NEEDLES) 32G X 4 MM MISC Use with flex pens for insulin, Normal      STOP taking these medications     atorvastatin (LIPITOR) 40 MG tablet      clopidogrel (PLAVIX) 75 MG tablet      phosphorus (K PHOS NEUTRAL) 155-852-130 MG tablet      predniSONE (DELTASONE) 5 MG tablet         Today   VITAL SIGNS:  Blood pressure (!) 170/80, pulse 92, temperature 98.7 F (37.1 C), temperature source Oral, resp. rate 19, height 5\' 3"  (1.6 m), weight 73.6 kg (162 lb 4.1 oz), SpO2 99 %.  I/O:  No intake or output data in the 24 hours ending 03/27/16 1647  PHYSICAL EXAMINATION:  Physical Exam  GENERAL:  71 y.o.-year-old patient lying in the bed with no acute distress. Obese LUNGS: Normal breath sounds bilaterally, no wheezing, rales,rhonchi or crepitation. No use of accessory muscles of respiration.  CARDIOVASCULAR: S1, S2 normal. No murmurs, rubs, or gallops.   ABDOMEN: Soft, non-tender, non-distended. Bowel sounds present. No organomegaly or mass.  NEUROLOGIC: Moves all 4 extremities. Left weaker than right PSYCHIATRIC: The patient is alert and awake. Flat affect SKIN: No obvious rash, lesion, or ulcer.   DATA REVIEW:   CBC No results for input(s): WBC, HGB, HCT, PLT in the last 168 hours.  Chemistries  No results for input(s): NA, K, CL, CO2, GLUCOSE, BUN, CREATININE, CALCIUM, MG, AST, ALT, ALKPHOS, BILITOT in the last 168 hours.  Invalid input(s): GFRCGP  Cardiac Enzymes No results for input(s): TROPONINI in the last 168 hours.  Microbiology Results  Results for orders placed or performed during the hospital encounter of 03/14/16  Urine culture     Status: Abnormal   Collection Time: 03/14/16  4:58 PM  Result Value Ref Range Status   Specimen Description URINE, RANDOM  Final   Special Requests NONE  Final   Culture >=100,000 COLONIES/mL YEAST (A)  Final   Report Status 03/16/2016 FINAL  Final  MRSA PCR Screening     Status: None   Collection Time: 03/14/16 10:31 PM  Result Value Ref Range Status   MRSA by PCR NEGATIVE NEGATIVE Final    Comment:        The GeneXpert MRSA Assay (FDA approved for NASAL specimens only), is one component of a comprehensive MRSA colonization surveillance program. It is not intended to diagnose MRSA infection nor to guide or monitor treatment for MRSA infections.   C difficile quick scan w PCR reflex     Status: Abnormal   Collection Time: 03/15/16  1:45 AM  Result Value Ref Range Status   C Diff antigen POSITIVE (A) NEGATIVE Final   C Diff toxin NEGATIVE NEGATIVE Final   C Diff interpretation Results are indeterminate. See PCR results.  Final  Clostridium Difficile by PCR     Status: None   Collection Time: 03/15/16  1:45 AM  Result Value Ref Range Status   Toxigenic C Difficile by pcr NEGATIVE NEGATIVE Final    Comment: Patient is colonized with non toxigenic C. difficile. May not need  treatment unless significant symptoms are present.    RADIOLOGY:  No results found.  Follow up with PCP in 1 week.  Management plans discussed with the patient, family and they are in agreement.  CODE STATUS:  Code Status History    Date Active Date Inactive  Code Status Order ID Comments User Context   03/14/2016 10:33 PM 03/21/2016  5:19 PM DNR [id number]  Oralia Manis, MD Inpatient   01/21/2016 12:06 AM 01/25/2016  3:12 AM DNR [id number]  Oralia Manis, MD Inpatient   08/06/2015  6:17 PM 08/09/2015  8:04 PM DNR [id number]  Houston Siren, MD Inpatient   07/01/2015 10:51 AM 07/07/2015  9:40 PM DNR [id number]  Erin Fulling, MD ED   07/01/2015 10:31 AM 07/01/2015 10:51 AM Full Code [id number]  Erin Fulling, MD ED   02/22/2013  4:38 AM 02/23/2013  4:50 PM Full Code [id number]  Dorothea Ogle, MD Inpatient    Questions for Most Recent Historical Code Status (Order [id number])    Question Answer Comment   In the event of cardiac or respiratory ARREST Do not call a "code blue"    In the event of cardiac or respiratory ARREST Do not perform Intubation, CPR, defibrillation or ACLS    In the event of cardiac or respiratory ARREST Use medication by any route, position, wound care, and other measures to relive pain and suffering. May use oxygen, suction and manual treatment of airway obstruction as needed for comfort.         Advance Directive Documentation   Flowsheet Row Most Recent Value  Type of Advance Directive  Out of facility DNR (pink MOST or yellow form)  Pre-existing out of facility DNR order (yellow form or pink MOST form)  No data  "MOST" Form in Place?  No data      TOTAL TIME TAKING CARE OF THIS PATIENT ON DAY OF DISCHARGE: more than 30 minutes.   Milagros Loll R M.D on 03/27/2016 at 4:47 PM  Between 7am to 6pm - Pager - [phone]  After 6pm go to [email or web address] - password [email or web address] [facility]  [facility] [place] [facility]  Office  [phone]  CC: Primary care physician; Emogene Morgan,  MD  Note: This dictation was prepared with Dragon dictation along with smaller phrase technology. Any transcriptional errors that result from this process are unintentional.

## 2016-04-21 DEATH — deceased

## 2017-01-05 IMAGING — CT CT ABD-PELV W/ CM
2 of 5 series · 16 of 46 positions shown, 18 images · IV contrast (APPLIED)
Comparison: October 05, 2015

CLINICAL DATA: Nausea, vomiting and elevated blood sugar.

EXAM:
CT ABDOMEN AND PELVIS WITH CONTRAST
TECHNIQUE: Multidetector CT imaging of the abdomen and pelvis was performed
using the standard protocol following bolus administration of
intravenous contrast.
CONTRAST:  100mL 9G0N4M-V44 IOPAMIDOL (9G0N4M-V44) INJECTION 61%

[Series 2: routine abd/pel with · axial · 0.79mm/px · z∈[-1077,-652]mm · 13 of 97 slices shown, 15 images]
[im 6/97  soft-tissue]
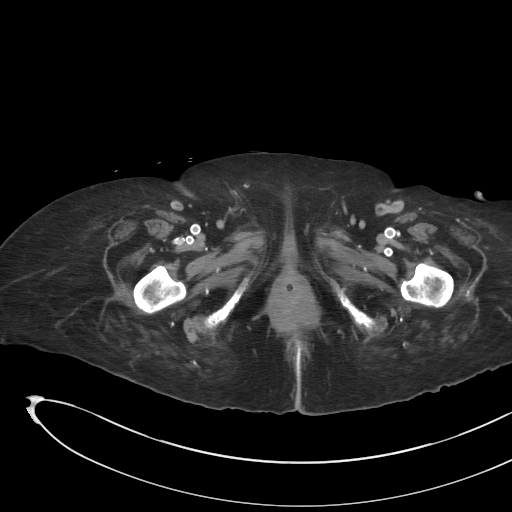
[im 6/97  bone]
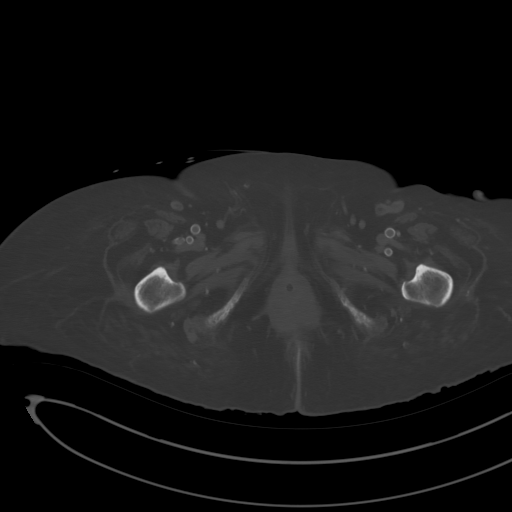
[im 16/97  soft-tissue]
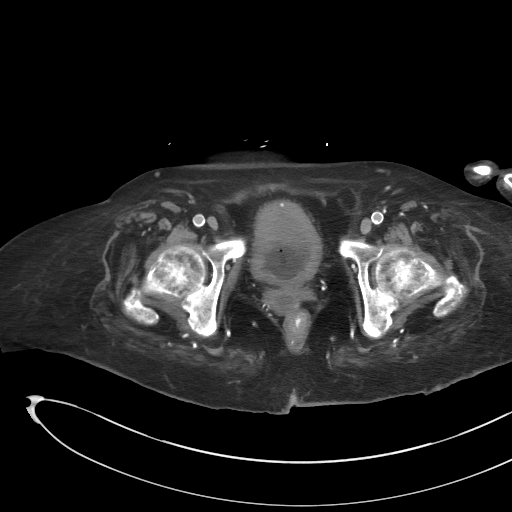
[im 21/97  soft-tissue]
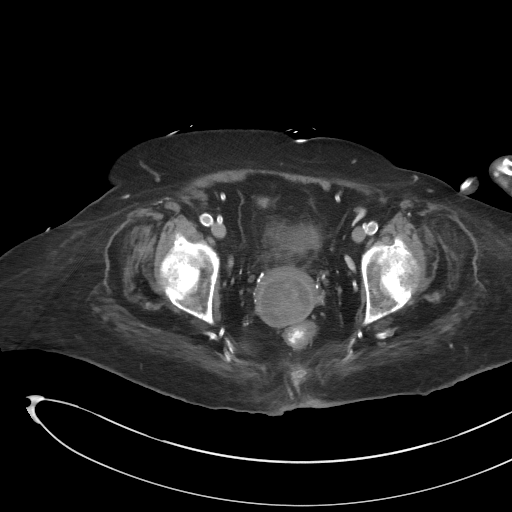
[im 26/97  soft-tissue]
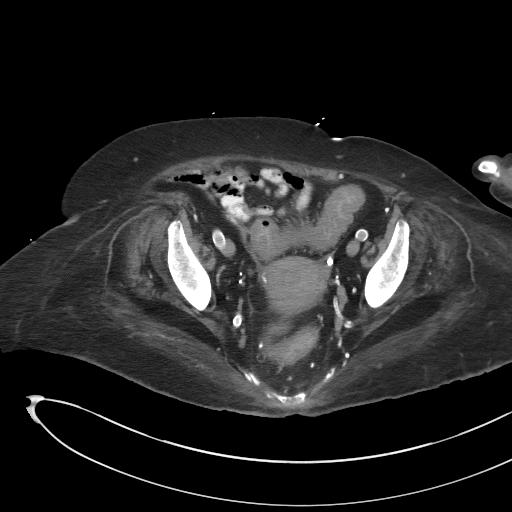
[im 36/97  soft-tissue]
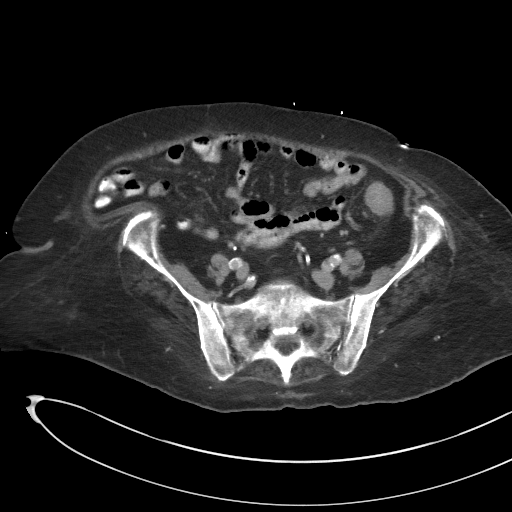
[im 41/97  soft-tissue]
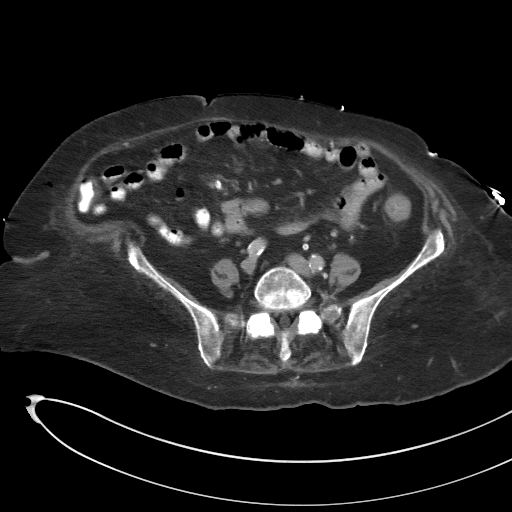
[im 51/97  soft-tissue]
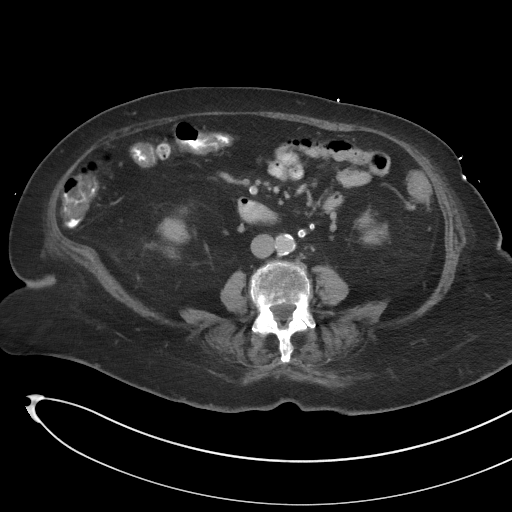
[im 56/97  soft-tissue]
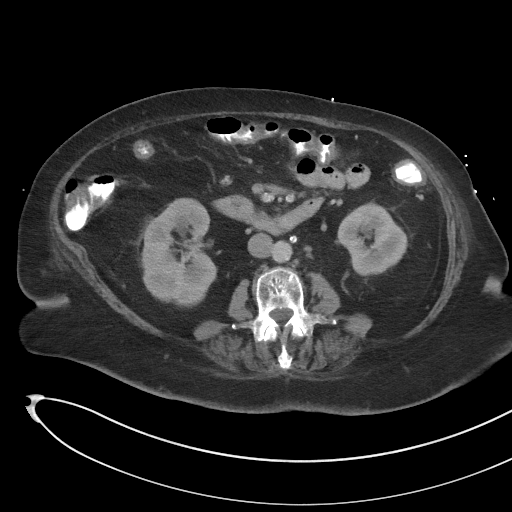
[im 61/97  soft-tissue]
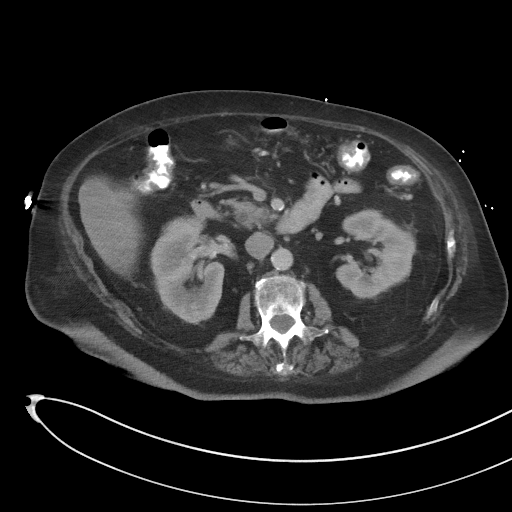
[im 61/97  bone]
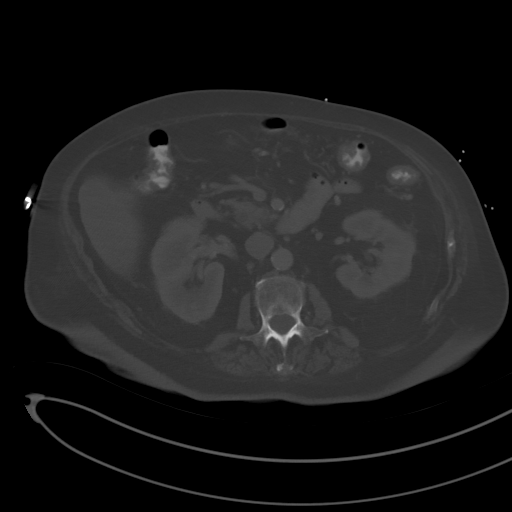
[im 71/97  soft-tissue]
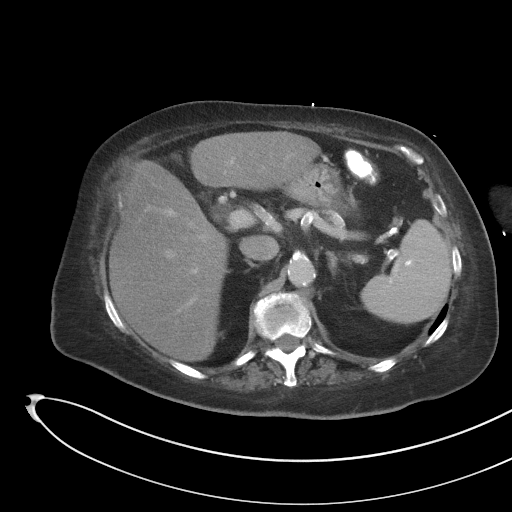
[im 76/97  soft-tissue]
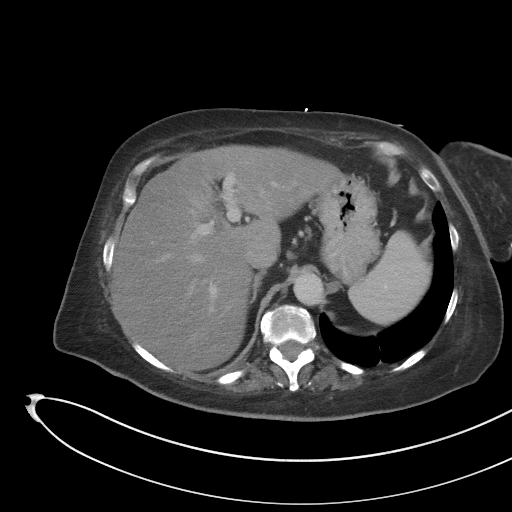
[im 81/97  soft-tissue]
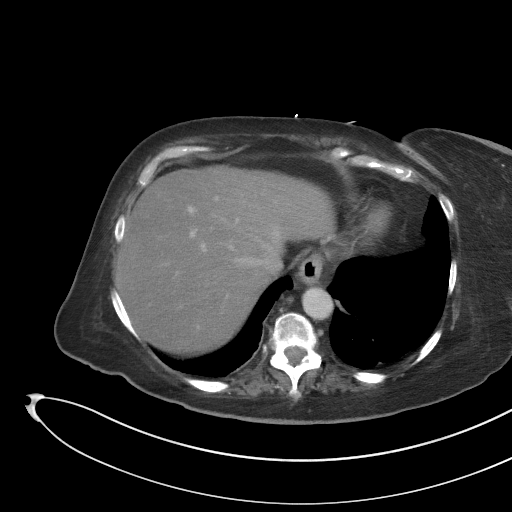
[im 91/97  soft-tissue]
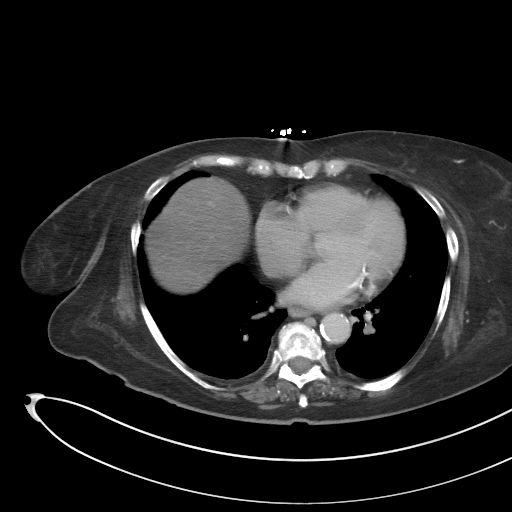

[Series 5: coronal st · coronal · 0.83mm/px · 3 of 88 slices shown]
[im 30/88  soft-tissue]
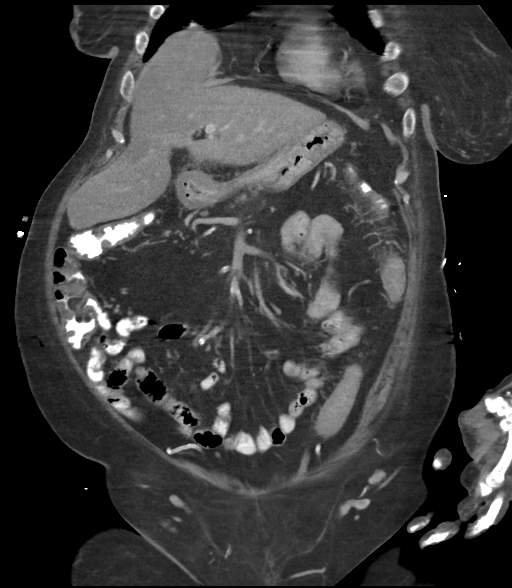
[im 39/88  soft-tissue]
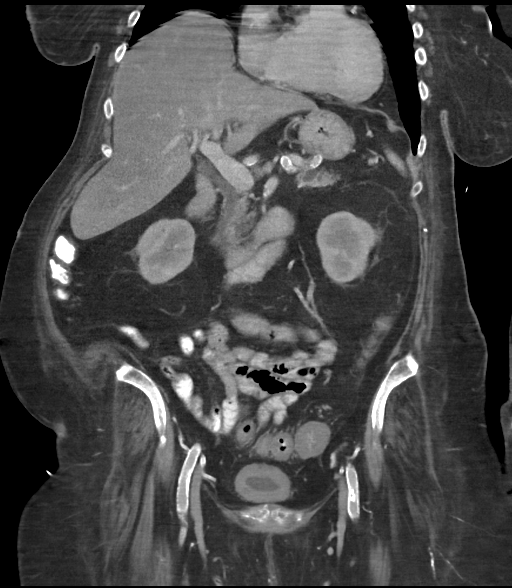
[im 49/88  soft-tissue]
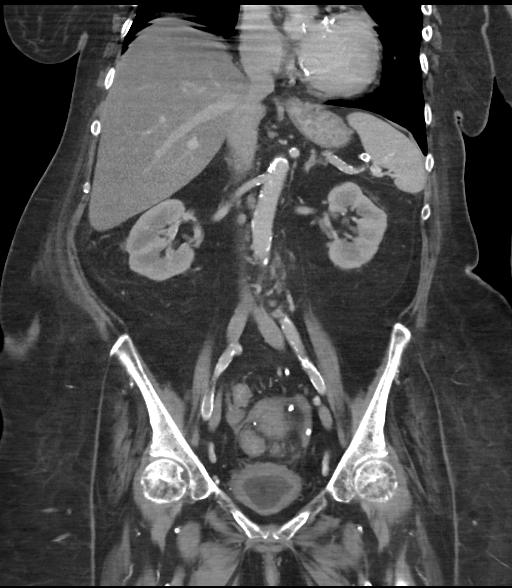

[16 of 46 positions shown; findings below may reference images not displayed]

FINDINGS: Lower chest: There is mild scar of the left lung base. No acute
abnormality is identified.

Hepatobiliary: There is diffuse low density of liver without vessel
displacement. No focal liver lesion is identified. Patient status
post prior cholecystectomy. The common bowel duct measures 1.3 cm,
consistent with postsurgical dilatation. There is no intrahepatic
biliary duct dilatation.

Pancreas: The pancreas is atrophic. There are cysts within the
pancreas. There is 1 cyst in the body/tail of the pancreas measuring
1.2 cm. There is a cyst extending off of the inferior head of the
pancreas measuring 2 cm. These are unchanged compared to prior exam.

Spleen: Normal in size without focal abnormality.

Adrenals/Urinary Tract: Adrenal glands are unremarkable. Kidneys are
normal, without renal calculi, focal lesion, or hydronephrosis. The
bladder is diffusely thick wall with Foley catheter in place.

Stomach/Bowel: There is no small bowel obstruction. There is
abnormal diffuse thick walled descending and sigmoid colon and to a
lesser degree transverse colon. Patient status post prior
appendectomy.

Vascular/Lymphatic: Aortic atherosclerosis. No enlarged abdominal or
pelvic lymph nodes.

Reproductive: There is a 4.5 x 3.7 x 3.9 cm heterogeneous density
mass at the junction of the uterus and cervix unchanged compared
prior exam. There is probably a uterine fibroid along the left
aspect of the uterus unchanged.

Other: No abdominal wall hernia or abnormality. No abdominopelvic
ascites.

Musculoskeletal: Degenerative joint changes of the spine are
identified. Mild decreased vertebral body height of L1 is unchanged.
IMPRESSION: Abnormal diffuse bowel wall thickening of the descending and sigmoid
colon and to a lesser degree transverse colon. The findings are
consistent with colitis.

There is no small bowel obstruction.

Diffuse thick walled bladder.  Cystitis is not excluded.

Cystic masses within the pancreas unchanged compared to prior CT.

Mass in the junction of the uterus/ cervix, unchanged compared to
prior exam.

## 2017-02-26 IMAGING — DX DG CHEST 1V PORT
1 series · 1 of 1 positions shown · non-contrast
Comparison: 01/20/2016

CLINICAL DATA: Weakness and SOB x 2 days, strong urine odor

EXAM:
PORTABLE CHEST - 1 VIEW

[chest ap]
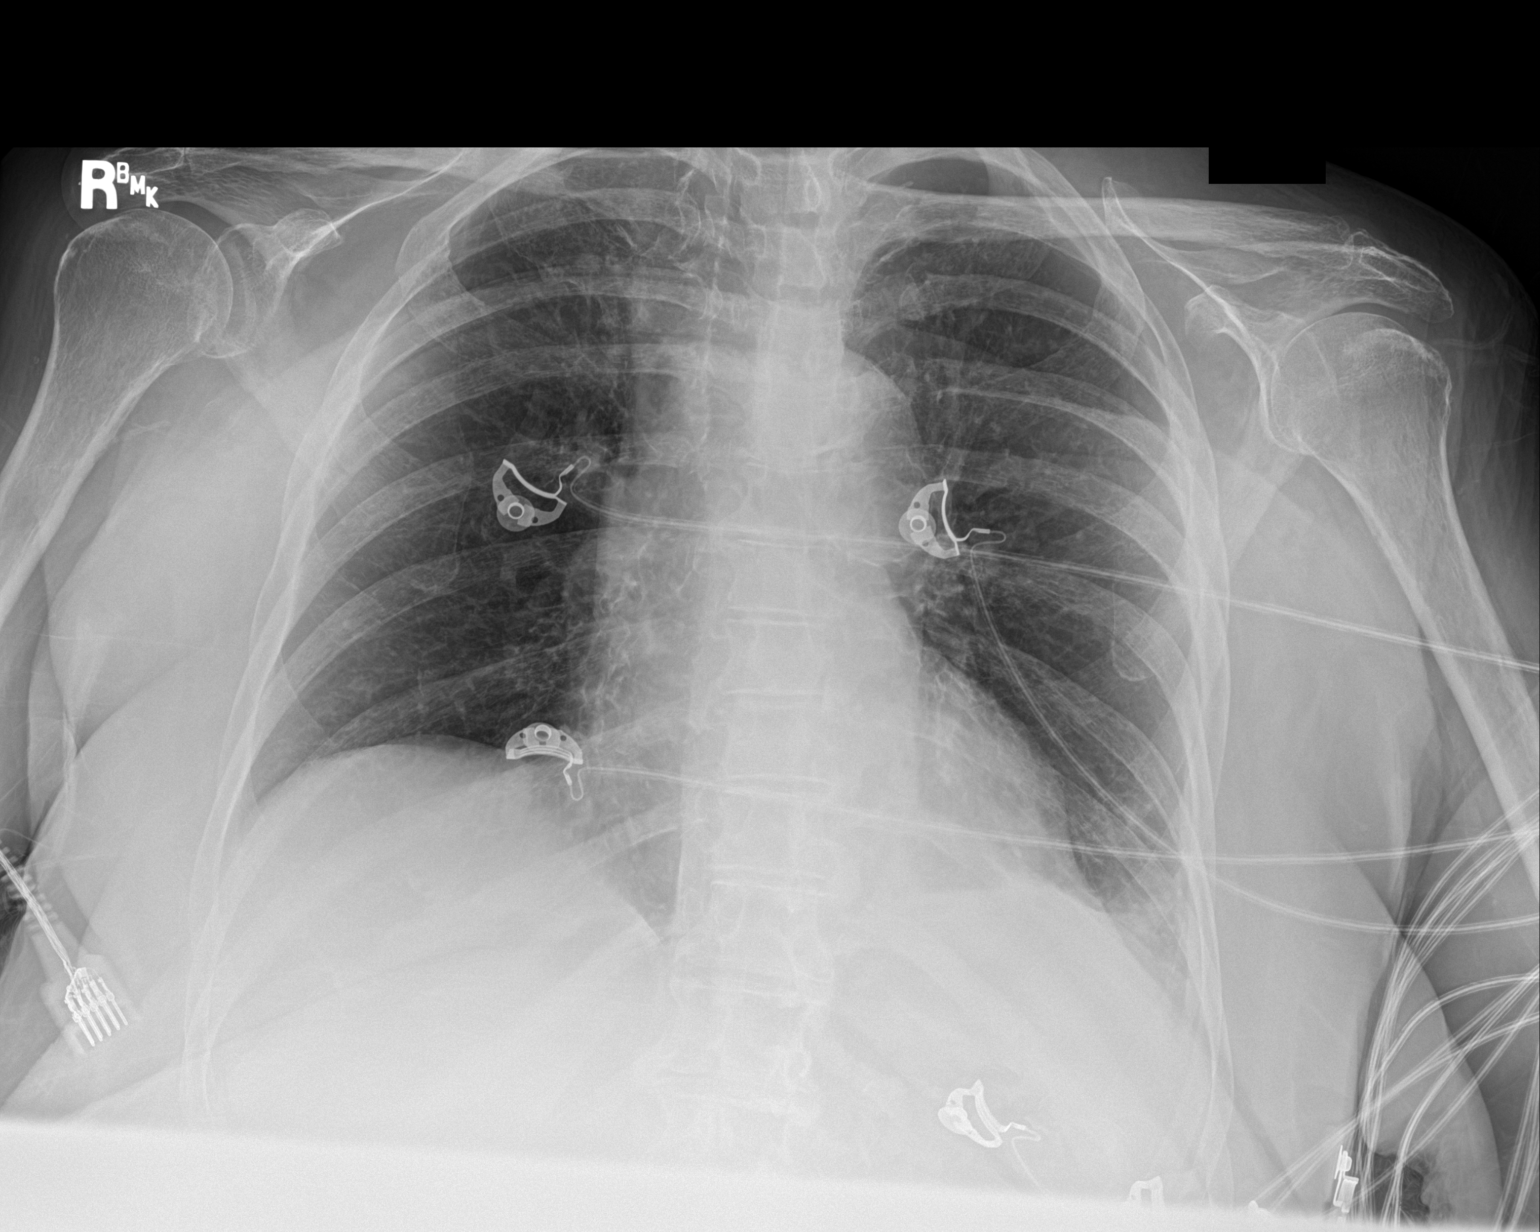

[1 of 1 positions shown; findings below may reference images not displayed]

FINDINGS: Lungs are clear.

Heart size and mediastinal contours are within normal limits.

No effusion.

Cervical fixation hardware again noted.
IMPRESSION: No acute cardiopulmonary disease.
